# Patient Record
Sex: Female | Born: 1965 | Race: White | Hispanic: No | Marital: Married | Smoking: Never smoker
Health system: Southern US, Community
[De-identification: ages and names within clinical notes are randomized; demographics above are authoritative.]

## PROBLEM LIST (undated history)

## (undated) DIAGNOSIS — J309 Allergic rhinitis, unspecified: Secondary | ICD-10-CM

## (undated) DIAGNOSIS — R030 Elevated blood-pressure reading, without diagnosis of hypertension: Secondary | ICD-10-CM

## (undated) DIAGNOSIS — D759 Disease of blood and blood-forming organs, unspecified: Secondary | ICD-10-CM

## (undated) DIAGNOSIS — L708 Other acne: Secondary | ICD-10-CM

## (undated) DIAGNOSIS — K219 Gastro-esophageal reflux disease without esophagitis: Secondary | ICD-10-CM

## (undated) DIAGNOSIS — R112 Nausea with vomiting, unspecified: Secondary | ICD-10-CM

## (undated) DIAGNOSIS — F3289 Other specified depressive episodes: Secondary | ICD-10-CM

## (undated) DIAGNOSIS — F411 Generalized anxiety disorder: Secondary | ICD-10-CM

## (undated) DIAGNOSIS — T4145XA Adverse effect of unspecified anesthetic, initial encounter: Secondary | ICD-10-CM

## (undated) DIAGNOSIS — D235 Other benign neoplasm of skin of trunk: Secondary | ICD-10-CM

## (undated) DIAGNOSIS — G4700 Insomnia, unspecified: Secondary | ICD-10-CM

## (undated) DIAGNOSIS — Z9889 Other specified postprocedural states: Secondary | ICD-10-CM

## (undated) DIAGNOSIS — E782 Mixed hyperlipidemia: Secondary | ICD-10-CM

## (undated) DIAGNOSIS — T8859XA Other complications of anesthesia, initial encounter: Secondary | ICD-10-CM

## (undated) DIAGNOSIS — F329 Major depressive disorder, single episode, unspecified: Secondary | ICD-10-CM

## (undated) HISTORY — DX: Elevated blood-pressure reading, without diagnosis of hypertension: R03.0

## (undated) HISTORY — DX: Allergic rhinitis, unspecified: J30.9

## (undated) HISTORY — DX: Disease of blood and blood-forming organs, unspecified: D75.9

## (undated) HISTORY — DX: Generalized anxiety disorder: F41.1

## (undated) HISTORY — DX: Insomnia, unspecified: G47.00

## (undated) HISTORY — DX: Other benign neoplasm of skin of trunk: D23.5

## (undated) HISTORY — DX: Other specified depressive episodes: F32.89

## (undated) HISTORY — DX: Other acne: L70.8

## (undated) HISTORY — DX: Major depressive disorder, single episode, unspecified: F32.9

## (undated) HISTORY — DX: Mixed hyperlipidemia: E78.2

---

## 1995-08-10 HISTORY — PX: OTHER SURGICAL HISTORY: SHX169

## 1998-08-09 HISTORY — PX: BREAST SURGERY: SHX581

## 2004-04-23 ENCOUNTER — Other Ambulatory Visit: Admission: RE | Admit: 2004-04-23 | Discharge: 2004-04-23 | Payer: Self-pay | Admitting: Obstetrics and Gynecology

## 2004-09-07 ENCOUNTER — Inpatient Hospital Stay (HOSPITAL_COMMUNITY): Admission: AD | Admit: 2004-09-07 | Discharge: 2004-09-09 | Payer: Self-pay | Admitting: Obstetrics and Gynecology

## 2004-09-10 ENCOUNTER — Encounter: Admission: RE | Admit: 2004-09-10 | Discharge: 2004-10-10 | Payer: Self-pay | Admitting: Obstetrics and Gynecology

## 2005-05-14 ENCOUNTER — Ambulatory Visit: Payer: Self-pay | Admitting: Family Medicine

## 2005-06-03 ENCOUNTER — Ambulatory Visit: Payer: Self-pay | Admitting: Family Medicine

## 2005-12-08 ENCOUNTER — Ambulatory Visit: Payer: Self-pay | Admitting: Family Medicine

## 2006-09-12 ENCOUNTER — Inpatient Hospital Stay (HOSPITAL_COMMUNITY): Admission: AD | Admit: 2006-09-12 | Discharge: 2006-09-14 | Payer: Self-pay | Admitting: Obstetrics and Gynecology

## 2006-09-15 ENCOUNTER — Encounter: Admission: RE | Admit: 2006-09-15 | Discharge: 2006-10-13 | Payer: Self-pay | Admitting: Obstetrics and Gynecology

## 2006-10-14 ENCOUNTER — Encounter: Admission: RE | Admit: 2006-10-14 | Discharge: 2006-10-27 | Payer: Self-pay | Admitting: Obstetrics and Gynecology

## 2007-07-31 ENCOUNTER — Encounter: Payer: Self-pay | Admitting: Internal Medicine

## 2007-08-09 ENCOUNTER — Encounter: Payer: Self-pay | Admitting: Family Medicine

## 2007-10-04 ENCOUNTER — Ambulatory Visit: Payer: Self-pay | Admitting: Internal Medicine

## 2007-10-04 DIAGNOSIS — F411 Generalized anxiety disorder: Secondary | ICD-10-CM

## 2007-12-05 ENCOUNTER — Ambulatory Visit: Payer: Self-pay | Admitting: Endocrinology

## 2007-12-05 DIAGNOSIS — J069 Acute upper respiratory infection, unspecified: Secondary | ICD-10-CM | POA: Insufficient documentation

## 2008-04-22 ENCOUNTER — Ambulatory Visit: Payer: Self-pay | Admitting: Internal Medicine

## 2008-04-22 DIAGNOSIS — E782 Mixed hyperlipidemia: Secondary | ICD-10-CM | POA: Insufficient documentation

## 2008-04-22 DIAGNOSIS — D235 Other benign neoplasm of skin of trunk: Secondary | ICD-10-CM

## 2008-04-22 DIAGNOSIS — R030 Elevated blood-pressure reading, without diagnosis of hypertension: Secondary | ICD-10-CM | POA: Insufficient documentation

## 2008-04-22 DIAGNOSIS — G4700 Insomnia, unspecified: Secondary | ICD-10-CM

## 2008-04-22 DIAGNOSIS — E785 Hyperlipidemia, unspecified: Secondary | ICD-10-CM | POA: Insufficient documentation

## 2008-04-22 LAB — CONVERTED CEMR LAB
AST: 16 units/L (ref 0–37)
Alkaline Phosphatase: 91 units/L (ref 39–117)
BUN: 8 mg/dL (ref 6–23)
Bacteria, UA: NEGATIVE
Basophils Relative: 0.6 % (ref 0.0–3.0)
Calcium: 9.3 mg/dL (ref 8.4–10.5)
Cholesterol, target level: 200 mg/dL
Cholesterol: 228 mg/dL (ref 0–200)
Crystals: NEGATIVE
Direct LDL: 139 mg/dL
GFR calc Af Amer: 143 mL/min
Hemoglobin, Urine: NEGATIVE
Lymphocytes Relative: 21.3 % (ref 12.0–46.0)
MCHC: 35.9 g/dL (ref 30.0–36.0)
MCV: 82.8 fL (ref 78.0–100.0)
Monocytes Relative: 7.6 % (ref 3.0–12.0)
Mucus, UA: NEGATIVE
Neutrophils Relative %: 66.6 % (ref 43.0–77.0)
Nitrite: NEGATIVE
Platelets: 464 10*3/uL — ABNORMAL HIGH (ref 150–400)
Potassium: 4.4 meq/L (ref 3.5–5.1)
RBC / HPF: NONE SEEN
RDW: 11.2 % — ABNORMAL LOW (ref 11.5–14.6)
Total CHOL/HDL Ratio: 3.2
Total Protein: 7.4 g/dL (ref 6.0–8.3)
Urobilinogen, UA: 0.2 (ref 0.0–1.0)

## 2008-04-23 ENCOUNTER — Encounter: Payer: Self-pay | Admitting: Internal Medicine

## 2008-05-07 ENCOUNTER — Ambulatory Visit: Payer: Self-pay | Admitting: Internal Medicine

## 2008-05-07 DIAGNOSIS — D473 Essential (hemorrhagic) thrombocythemia: Secondary | ICD-10-CM

## 2008-05-07 DIAGNOSIS — R82998 Other abnormal findings in urine: Secondary | ICD-10-CM

## 2008-05-07 LAB — CONVERTED CEMR LAB
Basophils Absolute: 0 10*3/uL (ref 0.0–0.1)
Bilirubin Urine: NEGATIVE
Chlamydia, Swab/Urine, PCR: NEGATIVE
Eosinophils Relative: 3.2 % (ref 0.0–5.0)
HCT: 38.3 % (ref 36.0–46.0)
Lymphocytes Relative: 21 % (ref 12.0–46.0)
Monocytes Relative: 6.1 % (ref 3.0–12.0)
Mucus, UA: NEGATIVE
Neutro Abs: 5.6 10*3/uL (ref 1.4–7.7)
Neutrophils Relative %: 69.2 % (ref 43.0–77.0)
RBC / HPF: NONE SEEN
RDW: 11.7 % (ref 11.5–14.6)
Urine Glucose: NEGATIVE mg/dL
WBC: 8.1 10*3/uL (ref 4.5–10.5)

## 2008-09-17 ENCOUNTER — Telehealth: Payer: Self-pay | Admitting: Internal Medicine

## 2008-12-06 ENCOUNTER — Ambulatory Visit: Payer: Self-pay | Admitting: Endocrinology

## 2008-12-06 DIAGNOSIS — J309 Allergic rhinitis, unspecified: Secondary | ICD-10-CM | POA: Insufficient documentation

## 2009-04-29 ENCOUNTER — Telehealth: Payer: Self-pay | Admitting: Internal Medicine

## 2009-05-27 ENCOUNTER — Ambulatory Visit: Payer: Self-pay | Admitting: Internal Medicine

## 2009-05-27 DIAGNOSIS — K5289 Other specified noninfective gastroenteritis and colitis: Secondary | ICD-10-CM

## 2009-05-29 ENCOUNTER — Telehealth: Payer: Self-pay | Admitting: Internal Medicine

## 2009-07-08 ENCOUNTER — Ambulatory Visit: Payer: Self-pay | Admitting: Internal Medicine

## 2009-11-10 ENCOUNTER — Encounter: Admission: RE | Admit: 2009-11-10 | Discharge: 2009-11-10 | Payer: Self-pay | Admitting: Obstetrics and Gynecology

## 2010-04-12 ENCOUNTER — Emergency Department (HOSPITAL_COMMUNITY): Admission: EM | Admit: 2010-04-12 | Discharge: 2010-04-12 | Payer: Self-pay | Admitting: Family Medicine

## 2010-04-24 ENCOUNTER — Ambulatory Visit: Payer: Self-pay | Admitting: Internal Medicine

## 2010-04-24 DIAGNOSIS — F329 Major depressive disorder, single episode, unspecified: Secondary | ICD-10-CM

## 2010-05-01 ENCOUNTER — Ambulatory Visit: Payer: Self-pay | Admitting: Licensed Clinical Social Worker

## 2010-05-05 ENCOUNTER — Telehealth: Payer: Self-pay | Admitting: Internal Medicine

## 2010-05-08 ENCOUNTER — Ambulatory Visit: Payer: Self-pay | Admitting: Licensed Clinical Social Worker

## 2010-05-22 ENCOUNTER — Ambulatory Visit: Payer: Self-pay | Admitting: Licensed Clinical Social Worker

## 2010-06-16 ENCOUNTER — Encounter: Payer: Self-pay | Admitting: Internal Medicine

## 2010-06-16 ENCOUNTER — Ambulatory Visit: Payer: Self-pay | Admitting: Internal Medicine

## 2010-06-16 DIAGNOSIS — L708 Other acne: Secondary | ICD-10-CM | POA: Insufficient documentation

## 2010-06-16 LAB — CONVERTED CEMR LAB
AST: 17 units/L (ref 0–37)
Alkaline Phosphatase: 84 units/L (ref 39–117)
Basophils Absolute: 0 10*3/uL (ref 0.0–0.1)
Basophils Relative: 0.5 % (ref 0.0–3.0)
Bilirubin, Direct: 0.1 mg/dL (ref 0.0–0.3)
Calcium: 9.6 mg/dL (ref 8.4–10.5)
Chloride: 103 meq/L (ref 96–112)
Cholesterol: 266 mg/dL — ABNORMAL HIGH (ref 0–200)
Creatinine, Ser: 0.6 mg/dL (ref 0.4–1.2)
Direct LDL: 151 mg/dL
GFR calc non Af Amer: 116.45 mL/min (ref >60)
HCT: 36.3 % (ref 36.0–46.0)
HDL: 75.8 mg/dL (ref >39.00)
Hemoglobin: 12.8 g/dL (ref 12.0–15.0)
Ketones, ur: NEGATIVE mg/dL
Leukocytes, UA: NEGATIVE
Lymphs Abs: 1.7 10*3/uL (ref 0.7–4.0)
Monocytes Absolute: 0.6 10*3/uL (ref 0.1–1.0)
Nitrite: NEGATIVE
Platelets: 402 10*3/uL — ABNORMAL HIGH (ref 150.0–400.0)
RBC: 4.31 M/uL (ref 3.87–5.11)
RDW: 12.2 % (ref 11.5–14.6)
Sodium: 138 meq/L (ref 135–145)
TSH: 2.39 microintl units/mL (ref 0.35–5.50)
Total Bilirubin: 0.2 mg/dL — ABNORMAL LOW (ref 0.3–1.2)
Total Protein: 6.7 g/dL (ref 6.0–8.3)
Urine Glucose: NEGATIVE mg/dL
Urobilinogen, UA: 0.2 (ref 0.0–1.0)
VLDL: 42.4 mg/dL — ABNORMAL HIGH (ref 0.0–40.0)
WBC: 7.1 10*3/uL (ref 4.5–10.5)

## 2010-06-19 ENCOUNTER — Ambulatory Visit: Payer: Self-pay | Admitting: Licensed Clinical Social Worker

## 2010-06-22 ENCOUNTER — Telehealth (INDEPENDENT_AMBULATORY_CARE_PROVIDER_SITE_OTHER): Payer: Self-pay | Admitting: *Deleted

## 2010-07-30 ENCOUNTER — Encounter: Payer: Self-pay | Admitting: Internal Medicine

## 2010-08-30 ENCOUNTER — Encounter: Payer: Self-pay | Admitting: *Deleted

## 2010-09-08 NOTE — Assessment & Plan Note (Signed)
Summary: TRANSFER FROM DR JONES/ INJURED FINGER/ REFERRAL WANTED/ ANXI...   Vital Signs:  Patient profile:   45 year old female Height:      63 inches (160.02 cm) Weight:      179.8 pounds (81.73 kg) BMI:     31.97 O2 Sat:      95 % on Room air Temp:     98.7 degrees F (37.06 degrees C) oral Pulse rate:   101 / minute BP sitting:   138 / 82  (right arm) Cuff size:   large  Vitals Entered By: Orlan Leavens RMA (April 24, 2010 11:27 AM)  O2 Flow:  Room air CC: transferring from dr. Yetta Barre injured (L) hand ? referral Is Patient Diabetic? No Comments want rew rx for her cream need to go to [facility]   Primary Care Provider:  Newt Lukes MD  CC:  transferring from dr. Yetta Barre injured (L) hand ? referral.  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, Surveyor, quantity) planning to resume counseling - therpay appt sched in 2 weeks no SI/HI  also ?finger injury - cut tip of left index finger last week with kitchen knife seen in ED for same -  no swelling, drainage or fever +pain at site, unable to keep covered b/c location on finger  Preventive Screening-Counseling & Management  Alcohol-Tobacco     Smoking Status: never     Passive Smoke Exposure: no  Clinical Review Panels:  Lipid Management   Cholesterol:  228 (04/22/2008)   LDL (bad choesterol):  DEL (04/22/2008)   HDL (good cholesterol):  71.3 (04/22/2008)  CBC   WBC:  8.1 (05/07/2008)   RBC:  4.61 (05/07/2008)   Hgb:  13.4 (05/07/2008)   Hct:  38.3 (05/07/2008)   Platelets:  413 (05/07/2008)   MCV  83.2 (05/07/2008)   MCHC  35.0 (05/07/2008)   RDW  11.7 (05/07/2008)   PMN:  69.2 (05/07/2008)   Lymphs:  21.0  (05/07/2008)   Monos:  6.1 (05/07/2008)   Eosinophils:  3.2 (05/07/2008)   Basophil:  0.5 (05/07/2008)  Complete Metabolic Panel   Glucose:  94 (04/22/2008)   Sodium:  139 (04/22/2008)   Potassium:  4.4 (04/22/2008)   Chloride:  106 (04/22/2008)   CO2:  30 (04/22/2008)   BUN:  8 (04/22/2008)   Creatinine:  0.6 (04/22/2008)   Albumin:  3.6 (04/22/2008)   Total Protein:  7.4 (04/22/2008)   Calcium:  9.3 (04/22/2008)   Total Bili:  0.5 (04/22/2008)   Alk Phos:  91 (04/22/2008)   SGPT (ALT):  21 (04/22/2008)   SGOT (AST):  16 (04/22/2008)   Current Medications (verified): 1)  Aug Betamethasone Dipropionate 0.05 %  Crea (Aug Betamethasone Dipropionate) .... Apply To Affected Area Twice A Day For 2 Weeks 2)  Yaz 3-0.02 Mg  Tabs (Drospirenone-Ethinyl Estradiol) .... Take 1 Tablet By Mouth Once A Day Prn 3)  Zyrtec Allergy 10 Mg  Tabs (Cetirizine Hcl) .... Take As Needed 4)  Advil 200 Mg  Tabs (Ibuprofen) .... Take 1-2 By Mouth Once Daily Prn 5)  Melatonin 3 Mg Caps (Melatonin) .... Take At Bedtime 6)  Ranitidine Hcl 150 Mg Tabs (Ranitidine Hcl) .Marland KitchenMarland KitchenMarland Kitchen  Take 1 At Bedtime 7)  Sudafed 30 Mg Tabs (Pseudoephedrine Hcl) .... 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  The patient denies fever, weight loss, chest pain, syncope, peripheral edema, and abdominal pain.    Physical Exam  General:  alert and overweight-appearing.   Lungs:  normal respiratory effort, no intercostal  retractions or use of accessory muscles; normal breath sounds bilaterally - no crackles and no wheezes.    Heart:  normal rate, regular rhythm, no murmur, and no rub. BLE without edema.  Psych:  tearful at times during exam - Oriented X3, memory intact for recent and remote, normally interactive, good eye contact, mild anxious appearing, mod depressed appearing, and not agitated.      Impression & Recommendations:  Problem # 1:  DEPRESSION (ICD-311)  hx same, progressive symptoms - supported plans for re-initiated counseling - appt already set w/ susan bond but not available for 2 weeks also start meds - prev on sertraline 20y ago - will try wellbutrin to minimize sexual dysfx side effects - poss adv rxn as well as potential risk/benefit reviewed - pt agrees and will call if symptoms worse also use as needed xanax f/u 4-6 weeks to review symptoms, titrate or change meds as needed   Time spent with patient 30 minutes, more than 50% of this time was spent counseling patient on depression and problems concerning the need for med trials and titration for symptom control Her updated medication list for this problem includes:    Bupropion Hcl 150 Mg Xr24h-tab (Bupropion hcl) .Marland Kitchen... 1 by mouth once daily    Alprazolam 0.5 Mg Tabs (Alprazolam) .Marland Kitchen... 1/2-1 by mouth every 8 hours as needed  Orders: Prescription Created Electronically [phone])  Complete Medication List: 1)  Aug Betamethasone Dipropionate 0.05 % Crea (Aug betamethasone dipropionate) .... Apply to affected area twice a day for 2 weeks 2)  Yaz 3-0.02 Mg Tabs (Drospirenone-ethinyl estradiol) .... Take 1 tablet by mouth once a day prn 3)  Zyrtec Allergy 10 Mg Tabs (Cetirizine hcl) .... Take as needed 4)  Advil 200 Mg Tabs (Ibuprofen) .... Take 1-2 by mouth once daily prn 5)  Melatonin 3 Mg Caps (Melatonin) .... Take at bedtime 6)  Ranitidine Hcl 150 Mg Tabs (Ranitidine hcl) .... Take 1 at bedtime 7)  Sudafed 30 Mg Tabs (Pseudoephedrine  hcl) .... Use at bedtime 8)  Bupropion Hcl 150 Mg Xr24h-tab (Bupropion hcl) .Marland Kitchen.. 1 by mouth once daily 9)  Alprazolam 0.5 Mg Tabs (Alprazolam) .... 1/2-1 by mouth every 8 hours as needed  Patient Instructions: 1)  it was good to see you today. 2)  medications as discussed - your prescriptions have been electronically submitted to your pharmacy. Please take as directed. Contact our office if you believe you're having problems with the medication(s).  3)  Please schedule a follow-up appointment in 4-6 weeks - will perform med physical and labs. also review meds and symtoms , call sooner if problems.  Prescriptions: ALPRAZOLAM 0.5 MG TABS (ALPRAZOLAM) 1/2-1 by  mouth every 8 hours as needed  #40 x 1   Entered and Authorized by:   Newt Lukes MD   Signed by:   Newt Lukes MD on 04/24/2010   Method used:   Printed then faxed [facility] ...       [facility] [address] [phone]* (retail)       [address]       [place], [place]  [address]       Ph: [phone] or [phone]       Fax: [phone]   RxID:   [id number] BUPROPION HCL 150 MG XR24H-TAB (BUPROPION HCL) 1 by mouth once daily  #30 x 2   Entered and Authorized by:   Newt Lukes MD   Signed by:   Newt Lukes MD on 04/24/2010   Method used:   [facility]ron[facility] to        [facility] [address] [phone]* (retail)       [address]       [place], [place]  [address]       Ph: [phone] or [phone]       Fax: [phone]   RxID:   [id number]

## 2010-09-08 NOTE — Progress Notes (Signed)
Summary: rx refill req  Phone Note Refill Request Message from:  Patient on May 05, 2010 11:52 AM  Refills Requested: Medication #1:  AUG BETAMETHASONE DIPROPIONATE 0.05 %  CREA apply to affected area twice a day for 2 weeks   Dosage confirmed as above?Dosage Confirmed   Notes: [facility] ok for refill? please advise  Initial call taken by: Brenton Grills MA,  May 05, 2010 11:52 AM  Follow-up for Phone Call        ok to fill as prev rx'd Follow-up by: Newt Lukes MD,  May 05, 2010 12:18 PM  Additional Follow-up for Phone Call Additional follow up Details #1::        pt informed rx sent to [facility] Additional Follow-up by: Brenton Grills MA,  May 05, 2010 1:34 PM    Prescriptions: AUG BETAMETHASONE DIPROPIONATE 0.05 %  CREA (AUG BETAMETHASONE DIPROPIONATE) apply to affected area twice a day for 2 weeks  #45 x 3   Entered by:   Brenton Grills MA   Authorized by:   Newt Lukes MD   Signed by:   Brenton Grills MA on 05/05/2010   Method used:   Electronically to        [facility]* (retail)             ,          Ph: [phone]       Fax: [phone]   RxID:   [id number]

## 2010-09-08 NOTE — Assessment & Plan Note (Signed)
Summary: CPX/ NWS  #   Vital Signs:  Patient profile:   45 year old female Height:      63 inches (160.02 cm) Weight:      180.0 pounds (81.82 kg) O2 Sat:      97 % on Room air Temp:     98.9 degrees F (37.17 degrees C) oral Pulse rate:   101 / minute BP sitting:   112 / 78  (left arm) Cuff size:   large  Vitals Entered By: Orlan Leavens RMA (June 16, 2010 9:50 AM)  O2 Flow:  Room air CC: CPX Is Patient Diabetic? No Pain Assessment Patient in pain? no      Comments Want to discuss bupropion & alprazolam   Primary Care Markavious Micco:  Newt Lukes MD  CC:  CPX.  History of Present Illness: patient is here today for annual physical. Patient feels well and has no complaints.  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, Surveyor, quantity) planning to resume counseling - therpay appt sched in 2 weeks no SI/HI started bupropion for same 04/2010 and alpraz as needed -  feels improved, symptoms tolerable at this time though stressors unchanged  concerned with inc in acne - initially improved on Yaz long hx same- prev on retinA ?need cream or wash  dyslipidemia - has never taken rx same for same but started natural med for same in past week - would like to use this rather than rx if able -   Preventive Screening-Counseling & Management  Alcohol-Tobacco     Alcohol drinks/day: 0     Alcohol type: (rare)     Alcohol Counseling: not indicated; patient does not drink     Smoking Status: never     Passive Smoke Exposure: no     Tobacco Counseling: not indicated; no tobacco use  Caffeine-Diet-Exercise     Does Patient Exercise: yes     Exercise Counseling: not indicated; exercise is adequate     Depression Counseling: not indicated; screening negative for depression  Safety-Violence-Falls     Seat Belt Counseling: not indicated; patient wears seat belts     Helmet Counseling: not applicable     Firearm Counseling: not applicable     Violence Counseling: not applicable     Fall Risk Counseling: not indicated; no significant falls noted  Clinical Review Panels:  Prevention   Last Mammogram:  Pt states was done @ [facility] results was normal (11/07/2009)   Last Pap Smear:  Interpretation /Result:Negative for intraepithelial Lesion or Malignancy.    (11/07/2009)  Immunizations   Last Tetanus Booster:  Tdap (05/27/2009)   Last Flu Vaccine:  Fluvax 3+ (06/16/2010)  Lipid Management   Cholesterol:  228 (04/22/2008)   LDL (bad choesterol):  DEL (04/22/2008)   HDL (good cholesterol):  71.3 (04/22/2008)  CBC   WBC:  8.1 (05/07/2008)   RBC:  4.61 (05/07/2008)   Hgb:  13.4 (05/07/2008)   Hct:  38.3 (05/07/2008)   Platelets:  413 (05/07/2008)   MCV  83.2 (05/07/2008)   MCHC  35.0 (05/07/2008)   RDW  11.7 (05/07/2008)   PMN:  69.2 (05/07/2008)   Lymphs:  21.0 (05/07/2008)   Monos:  6.1 (05/07/2008)   Eosinophils:  3.2 (05/07/2008)   Basophil:  0.5 (05/07/2008)  Complete Metabolic Panel   Glucose:  94 (04/22/2008)   Sodium:  139 (04/22/2008)   Potassium:  4.4 (04/22/2008)   Chloride:  106 (04/22/2008)   CO2:  30 (04/22/2008)   BUN:  8 (04/22/2008)   Creatinine:  0.6 (04/22/2008)   Albumin:  3.6 (04/22/2008)   Total Protein:  7.4 (04/22/2008)   Calcium:  9.3 (04/22/2008)   Total Bili:  0.5 (04/22/2008)   Alk Phos:  91 (04/22/2008)   SGPT (ALT):  21 (04/22/2008)   SGOT (AST):  16 (04/22/2008)   Current Medications (verified): 1)  Aug Betamethasone Dipropionate 0.05 %  Crea (Aug Betamethasone Dipropionate) .... Apply To Affected Area Twice A Day For 2 Weeks 2)  Yaz 3-0.02 Mg  Tabs (Drospirenone-Ethinyl Estradiol) .... Take 1 Tablet By Mouth Once A Day Prn 3)   Zyrtec Allergy 10 Mg  Tabs (Cetirizine Hcl) .... Take As Needed 4)  Advil 200 Mg  Tabs (Ibuprofen) .... Take 1-2 By Mouth Once Daily Prn 5)  Melatonin 3 Mg Caps (Melatonin) .... Take At Bedtime 6)  Ranitidine Hcl 150 Mg Tabs (Ranitidine Hcl) .... Take 1 At Bedtime 7)  Sudafed 30 Mg Tabs (Pseudoephedrine Hcl) .... Use At Bedtime 8)  Bupropion Hcl 150 Mg Xr24h-Tab (Bupropion Hcl) .Marland Kitchen.. 1 By Mouth Once Daily 9)  Alprazolam 0.5 Mg Tabs (Alprazolam) .... 1/2-1 By Mouth Every 8 Hours As Needed  Allergies (verified): No Known Drug Allergies  Past History:  Past medical, surgical, family and social histories (including risk factors) reviewed, and no changes noted (except as noted below).  Past Medical History: Hx Anxiety and Depression age 65s GERD during pregnancy History of mononucleosis with assoc hepatitis eczema acne  MD roster: gyn - jodi brovard  Past Surgical History: Reviewed history from 10/04/2007 and no changes required. Breast Reduction - 2000 Deviated septum repair - 1997  Family History: Reviewed history from 10/04/2007 and no changes required. Mother is age 91 - healthy Father is age 28 - hyperlipidemia and hypertension Paternal grandfather has history of CAD Younger sister has history of post partum depression.  Social History: Reviewed history from 04/24/2010 and no changes required. 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 I have reviewed all other systems and they were negative.   Physical Exam  General:  overweight-appearing.  alert, well-developed, well-nourished, and cooperative to examination.    Head:  Normocephalic and atraumatic without obvious abnormalities. No apparent alopecia or balding. Eyes:  vision grossly intact; pupils equal,  round and reactive to light.  conjunctiva and lids normal.    Ears:  normal pinnae bilaterally, without erythema, swelling, or tenderness to palpation. (small acne within left canal tender with otoscope); TMs clear, without effusion, or cerumen impaction. Hearing grossly normal bilaterally  Mouth:  teeth and gums in good repair; mucous membranes moist, without lesions or ulcers. oropharynx clear without exudate, no erythema.  Neck:  supple, full ROM, no masses, no thyromegaly; no thyroid nodules or tenderness. no JVD or carotid bruits.   Lungs:  normal respiratory effort, no intercostal retractions or use of accessory muscles; normal breath sounds bilaterally - no crackles and no wheezes.    Heart:  normal rate, regular rhythm, no murmur, and no rub. BLE without edema. Abdomen:  soft, non-tender, normal bowel sounds, no distention; no masses and no appreciable hepatomegaly or splenomegaly.   Genitalia:  defer to gyn Msk:  No deformity or scoliosis noted of thoracic or lumbar spine.   Neurologic:  alert & oriented X3 and cranial nerves II-XII symetrically intact.  strength normal in all extremities, sensation intact to light touch, and gait normal. speech fluent without dysarthria or aphasia; follows commands with good comprehension.  Skin:  cystic acne, also cholestrol deposists around B eyes; no rashes, vesicles, ulcers, or erythema. No nodules or irregularity to palpation.  Cervical Nodes:  No lymphadenopathy noted Axillary Nodes:  No palpable lymphadenopathy - tender area on left side (chronic) Psych:  Oriented X3, memory intact for recent and remote, normally interactive, good eye contact, not anxious appearing, not depressed appearing, and not agitated.      Impression & Recommendations:  Problem # 1:  PREVENTIVE HEALTH CARE (ICD-V70.0) Patient has been counseled on age-appropriate routine health concerns for screening and prevention. These are reviewed and up-to-date. Immunizations are  up-to-date or declined. Labs ordered (done this AM) and ECG reviewed.  Orders: EKG w/ Interpretation (93000)  Problem # 2:  DEPRESSION (ICD-311)  Her updated medication list for this problem includes:    Bupropion Hcl 150 Mg Xr24h-tab (Bupropion hcl) .Marland Kitchen... 1 by mouth once daily    Alprazolam 0.5 Mg Tabs (Alprazolam) .Marland Kitchen... 1/2-1 by mouth every 8 hours as needed  hx same, progressive symptoms - supported plans for re-initiated counseling - appt already set w/ susan bond but not available for 2 weeks 04/2010 resumed meds -wellbutrin to minimize sexual dysfx side effects and as needed xanax rx [facility] cont same w/o change - f/u 3 months  Problem # 3:  CYSTIC ACNE (ICD-706.1)  on Yaz - suggested f/u gyn re: poss change, esp with inc DVT risk - pt declines rx for wash and cream - pt will try and let us know what works - or if alt med tx needed Her updated medication list for this problem includes:    Benzaclin 1-5 % Gel (Clindamycin phos-benzoyl perox) .Marland Kitchen... Wash face at bedtime as needed for acne    Tretinoin 0.05 % Crea (Tretinoin) .Marland Kitchen... Apply to affected skin once daily - two times a day as needed  Discussed care of the skin and different treatment options.   Orders: Prescription Created Electronically [phone])  Problem # 4:  INSOMNIA (ICD-780.52) chronic symptoms - intol of ambien in past (per her report) continue melatonin as needed. Treat the anxiety as ongoing and with lifestyle modifications and psychotherapy. rec improved sleep hygiene.   Problem # 5:  MIXED HYPERLIPIDEMIA (ICD-272.2)  prev rx'd crestor - did not take - on "plant" natural chol med x 1 week - to cont same until NEXT lipids if inc FLP this AM also continue lifestyle modifications including diet, exercise and weight loss.  Labs Reviewed: SGOT: 16 (04/22/2008)   SGPT: 21 (04/22/2008)  Lipid Goals: Chol Goal: 200 (04/22/2008)   HDL Goal: 40 (04/22/2008)   LDL Goal: 160 (04/22/2008)   TG Goal: 150  (04/22/2008)  Prior 10 Yr Risk Heart Disease: Not enough information (04/22/2008)   HDL:71.3 (04/22/2008)  LDL:DEL (04/22/2008)  Chol:228 (04/22/2008)  Trig:167 (04/22/2008)  Complete Medication List: 1)  Aug Betamethasone Dipropionate 0.05 % Crea (Aug betamethasone dipropionate) .... Apply to affected area twice a day for 2 weeks 2)  Yaz 3-0.02 Mg Tabs (Drospirenone-ethinyl estradiol) .... Take 1 tablet by mouth once a day prn 3)  Zyrtec  Allergy 10 Mg Tabs (Cetirizine hcl) .... Take as needed 4)  Advil 200 Mg Tabs (Ibuprofen) .... Take 1-2 by mouth once daily prn 5)  Melatonin 3 Mg Caps (Melatonin) .... Take at bedtime 6)  Ranitidine Hcl 150 Mg Tabs (Ranitidine hcl) .... Take 1 at bedtime 7)  Sudafed 30 Mg Tabs (Pseudoephedrine hcl) .... Use at bedtime 8)  Bupropion Hcl 150 Mg Xr24h-tab (Bupropion hcl) .Marland Kitchen.. 1 by mouth once daily 9)  Alprazolam 0.5 Mg Tabs (Alprazolam) .... 1/2-1 by mouth every 8 hours as needed 10)  Benzaclin 1-5 % Gel (Clindamycin phos-benzoyl perox) .... Wash face at bedtime as needed for acne 11)  Tretinoin 0.05 % Crea (Tretinoin) .... Apply to affected skin once daily - two times a day as needed  Other Orders: Admin 1st Vaccine ([address]) Flu Vaccine 57yrs + (98119)  Patient Instructions: 1)  it was good to see you today. 2)  will review your labs once returned and mail copy to you 3)  medications for acne as discussed - local prescriptions your prescriptions have been electronically submitted to your pharmacy. Please take as directed. Contact our office if you believe you're having problems with the medication(s). [facility] for either of these 4)  continue the wellbutrin and xanax as ongoing - refills sent to [facility] 5)  Please schedule a follow-up appointment in 3-4 months to review depression and anxiety, call sooner if problems.  Prescriptions: BUPROPION HCL 150 MG XR24H-TAB (BUPROPION HCL) 1 by mouth once daily  #90 x 3   Entered by:   Orlan Leavens RMA   Authorized by:   Newt Lukes MD   Signed by:   Orlan Leavens RMA on 06/16/2010   Method used:   Faxed to ...       [facility] (mail-order)             , [place]         Ph: [phone]       Fax: [phone]   RxID:   [id number] ALPRAZOLAM 0.5 MG TABS (ALPRAZOLAM) 1/2-1 by mouth every 8 hours as needed  #120 x 1   Entered and Authorized by:   Newt Lukes MD   Signed by:   Newt Lukes MD on 06/16/2010   Method used:   Print then Give to Patient   RxID:   [id number] TRETINOIN 0.05 % CREA (TRETINOIN) apply to affected skin once daily - two times a day as needed  #1 x 1   Entered and Authorized by:   Newt Lukes MD   Signed by:   Newt Lukes MD on 06/16/2010   Method used:   [facility]        [facility] [address] [phone]* (retail)       [address]       [place], [place]  [address]       Ph: [phone] or [phone]       Fax: [phone]   RxID:   [id number] BENZACLIN 1-5 % GEL (CLINDAMYCIN PHOS-BENZOYL PEROX) wash face at bedtime as needed for acne  #1 x 0   Entered and Authorized by:   Newt Lukes MD   Signed by:   Newt Lukes MD on 06/16/2010   Method used:   [facility] to        [facility] [address] [phone]* (retail)       [address]       [address]es       [place], [place]  [address]       Ph: [phone] or [phone]       Fax: [phone]   RxID:   [id number]    Orders Added: 1)  Admin 1st Vaccine [90471] 2)  Flu Vaccine 65yrs + [64403] 3)  EKG w/ Interpretation [93000] 4)  Est. Patient 40-64 years [99396] 5)  Est. Patient Level IV [47425] 6)  Prescription Created Electronically [[id number]]     Mammogram  Procedure date:  11/07/2009  Findings:      Pt states was done @ [facility] results was normal  Pap Smear  Procedure date:  11/07/2009  Findings:      Interpretation /Result:Negative for intraepithelial Lesion or Malignancy.    Flu  Vaccine Consent Questions     Do you have a history of severe allergic reactions to this vaccine? no    Any prior history of allergic reactions to egg and/or gelatin? no    Do you have a sensitivity to the preservative Thimersol? no    Do you have a past history of Guillan-Barre Syndrome? no    Do you currently have an acute febrile illness? no    Have you ever had a severe reaction to latex? no    Vaccine information given and explained to patient? yes    Are you currently pregnant? no    Lot Number:[id number]   Exp Date:02/06/2011   Site Given  Left Deltoid IM  Pap Smear  Procedure date:  11/07/2009  Findings:      Interpretation /Result:Negative for intraepithelial Lesion or Malignancy.    Marland Kitchenlbflu

## 2010-09-10 NOTE — Progress Notes (Signed)
Summary: PA-Tretinoin  Phone Note From Pharmacy   Summary of Call: PA-Tretinoin, [facility] @ [phone], Ref # [id number], awaiting form. Jessica [name]  June 22, 2010 4:54 PM  Faxed PA to [facility] @ [phone], awaiting approval. Jessica [name]  June 24, 2010 3:22 PM  Approved 06/25/10-06/24/2012. Initial call taken by: Jessica [name],  July 30, 2010 1:06 PM

## 2010-09-10 NOTE — Medication Information (Signed)
Summary: Prior autho & certified in total for Tretinoin/[employer]  Prior autho & certified in total for Tretinoin/[employer]   Imported By: Sherian Rein 08/06/2010 09:28:14  _____________________________________________________________________  External Attachment:    Type:   Image     Comment:   External Document

## 2010-11-23 ENCOUNTER — Telehealth: Payer: Self-pay

## 2010-11-23 NOTE — Telephone Encounter (Signed)
Patient called [facility] needing to know id MD needs to see her back. She was seen back in oct but not sure about a follow up appt. Please advise Thanks

## 2010-11-23 NOTE — Telephone Encounter (Signed)
Called pt per last ov md stated need to f/u in 3-4 months. Transferred pt to schedulers to set up appt...11/23/10@4 :54pm/LMB

## 2010-11-25 ENCOUNTER — Encounter: Payer: Self-pay | Admitting: Internal Medicine

## 2010-11-30 ENCOUNTER — Encounter: Payer: Self-pay | Admitting: Internal Medicine

## 2010-11-30 ENCOUNTER — Ambulatory Visit (INDEPENDENT_AMBULATORY_CARE_PROVIDER_SITE_OTHER): Payer: BC Managed Care – PPO | Admitting: Internal Medicine

## 2010-11-30 DIAGNOSIS — F411 Generalized anxiety disorder: Secondary | ICD-10-CM

## 2010-11-30 DIAGNOSIS — E782 Mixed hyperlipidemia: Secondary | ICD-10-CM

## 2010-11-30 NOTE — Progress Notes (Signed)
  Subjective:    Patient ID: Jessica [name], female    DOB: 07-14-1966, 45 y.o.   MRN: [id number]  HPI Here for follow up - reviewed chronic med issues  depression - onset summer 2011 Then associated with tearfulness, nervousness and sleep problems (chronic poor sleep) - Also associated with irritability of mood +hx same - tx with therapy and meds during last episode age 63s (20y ago) current symptoms precipitated by +social stressors (loss of spouse's job, Surveyor, quantity) briefly resume counseling fall 2011 - not needed at this time (over the crisis)-  no SI/HI- started bupropion for same 04/2010 and alpraz as needed -  feels improved, symptoms tolerable at this time though stressors unchanged  cystic acne - initially improved on Yaz long hx same- prev on retinA Also uses cream or wash  dyslipidemia - has never taken rx same (prev rx crestor) for same but takes natural med  - would like to use this rather than rx if able -   Past Medical History  Diagnosis Date  . DYSPLASTIC NEVUS, CHEST   . THROMBOCYTOSIS   . CYSTIC ACNE   . INSOMNIA   . ELEVATED BP READING WITHOUT DX HYPERTENSION   . DEPRESSION   . ALLERGIC RHINITIS CAUSE UNSPECIFIED   . ANXIETY DISORDER   . Mixed hyperlipidemia      Review of Systems  Constitutional: Negative for unexpected weight change.  Respiratory: Negative for shortness of breath.   Cardiovascular: Negative for chest pain.  Neurological: Negative for headaches.       Objective:   Physical Exam  Constitutional: She appears well-developed and well-nourished.       overweight  HENT:  Head: Normocephalic and atraumatic.  Cardiovascular: Normal rate, regular rhythm and normal heart sounds.   Pulmonary/Chest: Effort normal and breath sounds normal. No respiratory distress.  Psychiatric: She has a normal mood and affect. Her behavior is normal. Thought content normal.   Lab Results  Component Value Date   WBC 7.1 06/16/2010   HGB 12.8 06/16/2010    HCT 36.3 06/16/2010   PLT 402.0* 06/16/2010   CHOL 266* 06/16/2010   TRIG 212.0* 06/16/2010   HDL 75.80 06/16/2010   LDLDIRECT 151.0 06/16/2010   ALT 18 06/16/2010   AST 17 06/16/2010   NA 138 06/16/2010   K 4.8 06/16/2010   CL 103 06/16/2010   CREATININE 0.6 06/16/2010   BUN 12 06/16/2010   CO2 29 06/16/2010   TSH 2.39 06/16/2010       Assessment & Plan:   See problem list. Medications and labs reviewed today.

## 2010-11-30 NOTE — Patient Instructions (Signed)
It was good to see you today. Medications reviewed, no changes at this time. Call when refills needed Continue to work on lifestyle changes as discussed (low fat, low carb diet diet; improved exercise efforts; weight loss) to control sugar, blood pressure and cholesterol levels and/or reduce risk of developing other medical problems. Please schedule followup in 6 months for physical and labs, call sooner if problems.

## 2010-11-30 NOTE — Assessment & Plan Note (Signed)
The current medical regimen is effective;  continue present plan and medications.  

## 2010-11-30 NOTE — Assessment & Plan Note (Signed)
Working on diet, exercise and omega 3 supplements as she declines statin tx -   continue present plan and medications.

## 2010-12-11 ENCOUNTER — Other Ambulatory Visit: Payer: Self-pay | Admitting: Obstetrics and Gynecology

## 2010-12-11 DIAGNOSIS — Z1231 Encounter for screening mammogram for malignant neoplasm of breast: Secondary | ICD-10-CM

## 2010-12-15 ENCOUNTER — Ambulatory Visit
Admission: RE | Admit: 2010-12-15 | Discharge: 2010-12-15 | Disposition: A | Discharge status: Home / Self Care | Payer: BC Managed Care – PPO | Source: Ambulatory Visit | Attending: Obstetrics and Gynecology | Admitting: Obstetrics and Gynecology

## 2010-12-15 DIAGNOSIS — Z1231 Encounter for screening mammogram for malignant neoplasm of breast: Secondary | ICD-10-CM

## 2010-12-25 NOTE — Discharge Summary (Signed)
Jessica [name], Jessica [name]           ACCOUNT NO.:  [id number]   MEDICAL RECORD NO.:  [id number]          PATIENT TYPE:  INP   LOCATION:  9128                          FACILITY:  [facility]   PHYSICIAN:  Sherron Monday, MD        DATE OF BIRTH:  07/30/1966   DATE OF ADMISSION:  09/12/2006  DATE OF DISCHARGE:  09/14/2006                               DISCHARGE SUMMARY   ADMISSION DIAGNOSIS:  Intrauterine pregnancy at term with spontaneous  rupture of membranes.   DISCHARGE DIAGNOSIS:  Intrauterine pregnancy at term with spontaneous  rupture of membranes, delivered by spontaneous vaginal delivery.   HISTORY OF PRESENT ILLNESS:  A 45 year old G2, P1-0-0-1 at 37+ weeks by  first trimester ultrasound consistent with last menstrual period with an  [facility] of September 29, 2006 presents with complaints of loss of clear fluid  at 1 a.m. Evaluation in the admission maternal unit confirmed rupture of  membranes. Her sterile vaginal exam was 2-cm dilated, 50% effaced and -2  station.   Her past medical history is significant for thyroid disorders,  depression and anxiety, low back pain. Past surgical history is  significant for breast reduction.   PAST OBSTETRICAL/GYNECOLOGICAL HISTORY:  G1 was a 36 weeks' delivery, 7  pound 1 ounce, no complications. G2 is the present pregnancy. GYN:  She  has secondary infertility. She conceived on Clomid.   She has no known drug allergies.   She takes no medications currently.   Denies alcohol, tobacco, or drug use in the pregnancy and is married.   PRENATAL LABORATORY DATA:  A positive, antibody screen negative. RPR  nonreactive. Rubella immune. Hepatitis B surface antigen negative.  Gonorrhea negative. Chlamydia negative. Glucola of 89. Group B strep was  negative per the patient.   PRENATAL CARE:  She has advanced maternal age. She had a normal amnio  and was treated for an upper respiratory infection at 28 weeks. She also  had some problems with GERD that was  treated with Zantac.   On admission, she was afebrile with stable vital signs and was admitted  after Pitocin was started, and her progress was monitored. She  progressed rapidly to complete/complete and plus 2, and delivered a  viable female infant at 12:45 with Apgars at 9 at one minute and 10 at  five minutes and a weight of 6 pounds 13 ounces. Placenta was delivered  intact at 12:49. First degree perineal laceration and left labial  laceration was repaired with 2-0 Vicryl in a normal fashion. EBL was  less than 500 mL. Postoperative course was relatively uncomplicated. She  remained afebrile with stable vital signs throughout. Had routine care.  Her fundus remained nontender. Her hemoglobin decreased from 11.2 to  9.1.   Her discharge information:  She was A positive, rubella immune. She  plans to breast and bottle feed. Her hemoglobin as mentioned decreased  from 11.2 to 9.1. She will start using oral contraceptive pills at her  postpartum  checkup for contraception. She was discharged to home with prescriptions  for Motrin, prenatal vitamins and Vicodin as well as instructions to  take Colace  and MiraLax as needed for constipation. She voiced  understanding of these instructions and was discharged to home.      Sherron Monday, MD  Electronically Signed     JB/MEDQ  D:  09/14/2006  T:  09/14/2006  Job:  [id number]

## 2010-12-25 NOTE — Discharge Summary (Signed)
Jessica [name], Jessica [name]           ACCOUNT NO.:  [id number]   MEDICAL RECORD NO.:  [id number]          PATIENT TYPE:  INP   LOCATION:  9121                           FACILITY:   PHYSICIAN:  Malachi Pro. Ambrose Mantle, M.D. DATE OF BIRTH:  August 07, 1966   DATE OF ADMISSION:  09/07/2004  DATE OF DISCHARGE:  09/09/2004                                 DISCHARGE SUMMARY   A 45 year old white, married female, para 0, gravida 1, EDC October 02, 2004 by ultrasound admitted with premature rupture of the membranes.  Blood  group and type A positive with a negative antibody, Tay-Sachs negative, RPR  nonreactive, rubella immune, hepatitis B surface antigen negative, HIV  negative, GC and Chlamydia negative.  TSH 1.48.  One hour Glucola 111.  Group B Streptococcus negative.  The patient had intrauterine insemination  on January 08, 2004.  EDC was predicted as October 02, 2004.  Ultrasound on  June 08, 2004 showed a continued presence of a left ovarian cyst, 6 x 3.3  x 3.8 cm.  The patient declined surgical intervention.  On August 24, 2004  the cyst was smaller, only 1.9 x 1.6 x 2.1 cm.  An echo-free estimated fetal  weight was 2886 grams.  The patient had leakage of fluid and came here to  the Maternity Admission Unit.  Rupture of membranes was confirmed by fern on  speculum exam.  The patient was placed on Pitocin.  Contractions gradually  became every 2-3 minutes.   PAST MEDICAL HISTORY:   ALLERGIES:  No known allergies.   She had a septoplasty in 1994, breast reduction in 1996.  She had anxiety  and depression in the past.  Her sister had postpartum depression.   FAMILY HISTORY:  Father with high blood pressure.  Paternal grandfather with  high blood pressure and an MI.   HABITS:  The patient does not drink, smoke or take drugs.   PHYSICAL EXAMINATION:  On admission, her vital signs were normal.  Heart and  lungs were normal.  Fundal height had been 35.5 on August 31, 2004.  Fetal  heart  tones were normal.  Cervix 1 cm, 80%, vertex at a 0 station per the  admitting nurse.   HOSPITAL COURSE:  The patient reached 3 cm and received an epidural at  approximately 12 noon.  The cervix was fully dilated.  She pushed well  between periods of nausea and vomiting and delivered spontaneously over a  second-degree midline laceration by Dr. Ambrose Mantle, a living female infant, 7  pounds 1 ounce with Apgar's of 8 at 1 and 9 at 5 minutes.  The placenta was  intact.  Uterus normal.  Rectal was negative.  Second-degree midline  laceration repaired with 3-0 Vicryl.  Perineal hematoma controlled with  Vicryl suture.  Blood loss about 400 cubic centimeters.  Postpartum, the  patient did well and was discharged on the second postpartum day.   LABORATORY DATA:  Initial hemoglobin 12.1, hematocrit 34.7, white count  12,700 with platelet count 415,000.  Follow-up hemoglobin 9.9.  RPR was  nonreactive.   FINAL DIAGNOSIS:  Intrauterine pregnancy at 36+ weeks,  delivered vertex.   OPERATION:  Spontaneous delivery, vertex, repair of second degree midline  laceration.   FINAL CONDITION:  Improved.   DISCHARGE INSTRUCTIONS:  Our regular discharge instruction booklet, Tylenol  No. 3, #24 tablets one or two every 3-4 hours as needed for pain, one  refill.  The patient is advised to return to the office in six weeks for  follow-up examination.      TFH/MEDQ  D:  09/09/2004  T:  09/09/2004  Job:  [id number]

## 2011-03-11 ENCOUNTER — Encounter: Payer: Self-pay | Admitting: Internal Medicine

## 2011-03-23 ENCOUNTER — Encounter: Payer: Self-pay | Admitting: Internal Medicine

## 2011-05-11 ENCOUNTER — Other Ambulatory Visit: Payer: Self-pay | Admitting: *Deleted

## 2011-05-11 MED ORDER — BUPROPION HCL ER (XL) 150 MG PO TB24: 150.0000 mg | ORAL_TABLET | Freq: Every day | ORAL | Status: DC

## 2011-07-02 ENCOUNTER — Ambulatory Visit (INDEPENDENT_AMBULATORY_CARE_PROVIDER_SITE_OTHER): Payer: BC Managed Care – PPO | Admitting: Internal Medicine

## 2011-07-02 ENCOUNTER — Encounter: Payer: Self-pay | Admitting: Internal Medicine

## 2011-07-02 VITALS — BP 136/88 | HR 96 | Temp 98.8°F | Wt 172.0 lb

## 2011-07-02 DIAGNOSIS — J309 Allergic rhinitis, unspecified: Secondary | ICD-10-CM

## 2011-07-02 DIAGNOSIS — F411 Generalized anxiety disorder: Secondary | ICD-10-CM

## 2011-07-02 DIAGNOSIS — J019 Acute sinusitis, unspecified: Secondary | ICD-10-CM

## 2011-07-02 MED ORDER — LEVOFLOXACIN 250 MG PO TABS: 250.0000 mg | ORAL_TABLET | Freq: Every day | ORAL | Status: DC

## 2011-07-02 MED ORDER — HYDROCODONE-HOMATROPINE 5-1.5 MG/5ML PO SYRP: 5.0000 mL | ORAL_SOLUTION | Freq: Four times a day (QID) | ORAL | Status: AC | PRN

## 2011-07-02 MED ORDER — LEVOFLOXACIN 250 MG PO TABS: 250.0000 mg | ORAL_TABLET | Freq: Every day | ORAL | Status: AC

## 2011-07-02 NOTE — Patient Instructions (Signed)
Take all new medications as prescribed Continue all other medications as before  

## 2011-07-03 ENCOUNTER — Encounter: Payer: Self-pay | Admitting: Internal Medicine

## 2011-07-03 NOTE — Assessment & Plan Note (Signed)
Mild to mod, for antibx course,  to f/u any worsening symptoms or concerns 

## 2011-07-03 NOTE — Assessment & Plan Note (Signed)
Mild to mod, for restart zyrtec asd,  to f/u any worsening symptoms or concerns 

## 2011-07-03 NOTE — Progress Notes (Signed)
Subjective:    Patient ID: Jessica [name], female    DOB: 24-Nov-1965, 45 y.o.   MRN: [id number]  HPI  Here with 2 wks acute onset fever, facial pain, pressure, general weakness and malaise, and greenish d/c, with slight ST, but little to no cough and Pt denies chest pain, increased sob or doe, wheezing, orthopnea, PND, increased LE swelling, palpitations, dizziness or syncope.  Does have several wks ongoing nasal allergy symptoms with clear congestion, itch and sneeze, without fever, pain, ST, cough or wheezing.  Denies worsening depressive symptoms, suicidal ideation, or panic, though has ongoing anxiety. Past Medical History  Diagnosis Date  . DYSPLASTIC NEVUS, CHEST   . THROMBOCYTOSIS   . CYSTIC ACNE   . INSOMNIA   . ELEVATED BP READING WITHOUT DX HYPERTENSION   . DEPRESSION   . ALLERGIC RHINITIS CAUSE UNSPECIFIED   . ANXIETY DISORDER   . Mixed hyperlipidemia    Past Surgical History  Procedure Date  . Breast surgery 200    Breast reduction  . Deviated septum repair 1997    reports that she has never smoked. She does not have any smokeless tobacco history on file. She reports that she does not drink alcohol or use illicit drugs. family history includes Coronary artery disease in her paternal grandfather; Depression in her sister; Hyperlipidemia in her father; and Hypertension in her father. No Known Allergies Current Outpatient Prescriptions on File Prior to Visit  Medication Sig Dispense Refill  . ALPRAZolam (XANAX) 0.5 MG tablet Take 0.5 mg by mouth every 8 (eight) hours as needed.        Marland Kitchen buPROPion (WELLBUTRIN XL) 150 MG 24 hr tablet Take 1 tablet (150 mg total) by mouth daily.  90 tablet  0  . cetirizine (ZYRTEC) 10 MG tablet Take 10 mg by mouth as needed.        . clindamycin-benzoyl peroxide (BENZACLIN) gel Apply topically at bedtime.        . drospirenone-ethinyl estradiol (YAZ) 3-0.02 MG per tablet Take 1 tablet by mouth daily.        Marland Kitchen ibuprofen (ADVIL,MOTRIN) 200  MG tablet Take 200 mg by mouth as needed. Take 2 tablets as needed      . Melatonin 3 MG TABS Take by mouth at bedtime as needed.       Marland Kitchen Phenylephrine HCl 5 MG TABS Take by mouth as needed.        . ranitidine (ZANTAC) 150 MG tablet Take 150 mg by mouth at bedtime.         Review of Systems Review of Systems  Constitutional: Negative for diaphoresis and unexpected weight change.  HENT: Negative for drooling and tinnitus.   Eyes: Negative for photophobia and visual disturbance.  Respiratory: Negative for choking and stridor.   Gastrointestinal: Negative for vomiting and blood in stool.  Genitourinary: Negative for hematuria and decreased urine volume.       Objective:   Physical Exam BP 136/88  Pulse 96  Temp(Src) 98.8 F (37.1 C) (Oral)  Wt 172 lb (78.019 kg)  SpO2 98% Physical Exam  VS noted, mild ill Constitutional: Pt appears well-developed and well-nourished.  HENT: Head: Normocephalic.  Right Ear: External ear normal.  Left Ear: External ear normal.  Bilat tm's mild erythema.  Sinus tender bilat.  Pharynx mild erythema Eyes: Conjunctivae and EOM are normal. Pupils are equal, round, and reactive to light.  Neck: Normal range of motion. Neck supple.  Cardiovascular: Normal rate and regular rhythm.  Pulmonary/Chest: Effort normal and breath sounds normal.  Neurological: Pt is alert. No cranial nerve deficit.  Skin: Skin is warm. No erythema.  Psychiatric: Pt behavior is normal. Thought content normal. 1+ nervous        Assessment & Plan:

## 2011-07-03 NOTE — Assessment & Plan Note (Signed)
stable overall by hx and exam,t, and pt to continue medical treatment as before   

## 2011-08-23 ENCOUNTER — Other Ambulatory Visit: Payer: Self-pay | Admitting: *Deleted

## 2011-08-23 MED ORDER — BUPROPION HCL ER (XL) 150 MG PO TB24: 150.0000 mg | ORAL_TABLET | Freq: Every day | ORAL | Status: DC

## 2011-08-31 ENCOUNTER — Telehealth: Payer: Self-pay | Admitting: *Deleted

## 2011-08-31 DIAGNOSIS — M79673 Pain in unspecified foot: Secondary | ICD-10-CM

## 2011-08-31 NOTE — Telephone Encounter (Signed)
Pt states md sent her to see orthopedic md about 2 years ago or so. Wanting to get a referral so she can be able to get orthodics for shoes again. Did inform pt md is out of office today will be tomorrow with response....08/31/11@4 :44pm/LMB

## 2011-09-02 NOTE — Telephone Encounter (Signed)
Notified pt with md response...09/02/11@12 :24pm/lmb

## 2011-09-02 NOTE — Telephone Encounter (Signed)
Will refer to [facility] for orthotic fitting as needed - thanks

## 2011-09-21 ENCOUNTER — Ambulatory Visit (INDEPENDENT_AMBULATORY_CARE_PROVIDER_SITE_OTHER): Payer: BC Managed Care – PPO | Admitting: Sports Medicine

## 2011-09-21 ENCOUNTER — Encounter: Payer: Self-pay | Admitting: Sports Medicine

## 2011-09-21 VITALS — BP 139/78 | HR 102 | Ht 63.0 in | Wt 170.0 lb

## 2011-09-21 DIAGNOSIS — M79671 Pain in right foot: Secondary | ICD-10-CM | POA: Insufficient documentation

## 2011-09-21 DIAGNOSIS — M79609 Pain in unspecified limb: Secondary | ICD-10-CM

## 2011-09-21 MED ORDER — KETOPROFEN POWD: Status: DC

## 2011-09-21 NOTE — Patient Instructions (Addendum)
Use the arch strap. Continue use if this helps midfoot pain  Use the topical ketoprofen for pain.  Use the adjusted orthotics. If this relieves pain, continue use for the next 9-12 months  If your pain resolves with this pain, then follow up with [place] as needed.

## 2011-09-21 NOTE — Assessment & Plan Note (Addendum)
Midfoot pain noted at tarsal metatarsal on border. Will place patient in midfoot archs trap. Previous custom orthotics modified for increased arch  support. Topical ketoprofen gel for pain relief. Patient instructed to advance activity slowly. Will followup as needed.  We added a low EVA E. medium density base to each orthotic the old bases had worn down  Walking and running gait was 3 comfortable once this was added She has much better control of pronation  With this modification she may get an additional 1-2 years out of her orthotics but if not we will be happy to make her a another pair

## 2011-09-23 NOTE — Progress Notes (Signed)
  Subjective:    Patient ID: Jessica [name], female    DOB: May 28, 1966, 46 y.o.   MRN: [id number]  HPI This is a patient who 2 years ago had significant foot pain. She saw Dr. Althea Charon. She was placed in orthotics and did very well. However for the past 2 months she's been having a new foot pain primarily in her right foot and less in the left foot. It occurs directly over her dorsal and she thought maybe her shoes were too tight. Change of shoe and use of ibuprofen did not help.  She likes to run about 20 miles per week. She has not been able to do that recently and so she comes for evaluation.   Review of Systems     Objective:   Physical Exam  Pleasant female in no acute distress  She has tenderness at the tarsometatarsal border on the dorsum of the right foot No tenderness over the left foot There is moderate collapse of the longitudinal arch bilaterally with the right greater than left There is some midfoot rotation on the right but no metatarsal tarsal bossing  The base on the current orthotics as worn out and when she is walking in these she no longer controls her pronation      Assessment & Plan:

## 2011-10-28 ENCOUNTER — Ambulatory Visit (INDEPENDENT_AMBULATORY_CARE_PROVIDER_SITE_OTHER): Payer: BC Managed Care – PPO | Admitting: Internal Medicine

## 2011-10-28 ENCOUNTER — Encounter: Payer: Self-pay | Admitting: Internal Medicine

## 2011-10-28 VITALS — BP 128/80 | HR 83 | Temp 99.6°F | Ht 63.0 in | Wt 174.0 lb

## 2011-10-28 DIAGNOSIS — J209 Acute bronchitis, unspecified: Secondary | ICD-10-CM

## 2011-10-28 DIAGNOSIS — J069 Acute upper respiratory infection, unspecified: Secondary | ICD-10-CM | POA: Insufficient documentation

## 2011-10-28 MED ORDER — PROMETHAZINE-CODEINE 6.25-10 MG/5ML PO SYRP: 5.0000 mL | ORAL_SOLUTION | ORAL | Status: AC | PRN

## 2011-10-28 MED ORDER — AZITHROMYCIN 250 MG PO TABS: ORAL_TABLET | ORAL | Status: AC

## 2011-10-28 NOTE — Progress Notes (Signed)
  Subjective:    Patient ID: Jessica [name], female    DOB: 1965-10-07, 46 y.o.   MRN: [id number]  HPI  Here with acute onset mild to mod 2-3 days ST, HA, general weakness and malaise, with prod cough greenish sputum, but Pt denies chest pain, increased sob or doe, wheezing, orthopnea, PND, increased LE swelling, palpitations, dizziness or syncope.  Began approx 1 wk ago overall with URI symtpoms, mild, then hoarseness, then above for last 3 days.   Pt denies polydipsia, polyuria.  Has appt to f/u with Dr Felicity Coyer next wk.No sick contacts. Flying out of town tomorrow.  Has 2 small children not currently ill Past Medical History  Diagnosis Date  . DYSPLASTIC NEVUS, CHEST   . THROMBOCYTOSIS   . CYSTIC ACNE   . INSOMNIA   . ELEVATED BP READING WITHOUT DX HYPERTENSION   . DEPRESSION   . ALLERGIC RHINITIS CAUSE UNSPECIFIED   . ANXIETY DISORDER   . Mixed hyperlipidemia    Past Surgical History  Procedure Date  . Breast surgery 200    Breast reduction  . Deviated septum repair 1997    reports that she has never smoked. She has never used smokeless tobacco. She reports that she does not drink alcohol or use illicit drugs. family history includes Coronary artery disease in her paternal grandfather; Depression in her sister; Hyperlipidemia in her father; and Hypertension in her father. No Known Allergies Current Outpatient Prescriptions on File Prior to Visit  Medication Sig Dispense Refill  . ALPRAZolam (XANAX) 0.5 MG tablet Take 0.5 mg by mouth every 8 (eight) hours as needed.        Marland Kitchen buPROPion (WELLBUTRIN XL) 150 MG 24 hr tablet Take 1 tablet (150 mg total) by mouth daily.  90 tablet  1  . cetirizine (ZYRTEC) 10 MG tablet Take 10 mg by mouth as needed.        . clindamycin-benzoyl peroxide (BENZACLIN) gel Apply topically at bedtime.        . drospirenone-ethinyl estradiol (YAZ) 3-0.02 MG per tablet Take 1 tablet by mouth daily.        Marland Kitchen ibuprofen (ADVIL,MOTRIN) 200 MG tablet Take 200 mg  by mouth as needed. Take 2 tablets as needed      . Melatonin 3 MG TABS Take by mouth at bedtime as needed.       Marland Kitchen Phenylephrine HCl 5 MG TABS Take by mouth as needed.        . ranitidine (ZANTAC) 150 MG tablet Take 150 mg by mouth at bedtime.         Review of Systems All otherwise neg per pt  Objective:   Physical Exam BP 128/80  Pulse 83  Temp(Src) 99.6 F (37.6 C) (Oral)  Ht 5\' 3"  (1.6 m)  Wt 174 lb (78.926 kg)  BMI 30.82 kg/m2  SpO2 95% Physical Exam  VS noted, mild ill Constitutional: Pt appears well-developed and well-nourished. Lavella Lemons HENT: Head: Normocephalic.  Right Ear: External ear normal.  Left Ear: External ear normal. Bilat tm's mild erythema.  Sinus nontender.  Pharynx mild erythema Eyes: Conjunctivae and EOM are normal. Pupils are equal, round, and reactive to light.  Neck: Normal range of motion. Neck supple.  Cardiovascular: Normal rate and regular rhythm.   Pulmonary/Chest: Effort normal and breath sounds normal.  Skin: Skin is warm. No erythema.  Psychiatric: Pt behavior is normal. Thought content normal.     Assessment & Plan:

## 2011-10-28 NOTE — Patient Instructions (Signed)
Take all new medications as prescribed Continue all other medications as before  

## 2011-10-28 NOTE — Assessment & Plan Note (Signed)
Mild to mod, for antibx course,  to f/u any worsening symptoms or concerns, also cough med prn 

## 2011-11-10 ENCOUNTER — Telehealth: Payer: Self-pay | Admitting: *Deleted

## 2011-11-10 ENCOUNTER — Other Ambulatory Visit: Payer: Self-pay | Admitting: Internal Medicine

## 2011-11-10 DIAGNOSIS — Z0000 Encounter for general adult medical examination without abnormal findings: Secondary | ICD-10-CM

## 2011-11-10 NOTE — Telephone Encounter (Signed)
Received staff msg pt made cpx for Friday morning want labs entered in epic. Will enter labs in order encounter for cpx labs... 11/10/11@2 :28pm/LMB

## 2011-11-10 NOTE — Telephone Encounter (Signed)
Message copied by Deatra James on Wed Nov 10, 2011  2:27 PM ------      Message from: Etheleen Sia      Created: Wed Nov 10, 2011  2:09 PM      Regarding: PHYSICAL LABS        COMING FRI MORNING FOR LABS PRIOR TO 8:00 APPT

## 2011-11-12 ENCOUNTER — Ambulatory Visit (INDEPENDENT_AMBULATORY_CARE_PROVIDER_SITE_OTHER): Payer: BC Managed Care – PPO | Admitting: Internal Medicine

## 2011-11-12 ENCOUNTER — Encounter: Payer: Self-pay | Admitting: Internal Medicine

## 2011-11-12 ENCOUNTER — Other Ambulatory Visit (INDEPENDENT_AMBULATORY_CARE_PROVIDER_SITE_OTHER): Payer: BC Managed Care – PPO

## 2011-11-12 VITALS — BP 110/78 | HR 85 | Temp 98.6°F | Ht 63.0 in | Wt 175.0 lb

## 2011-11-12 DIAGNOSIS — Z0000 Encounter for general adult medical examination without abnormal findings: Secondary | ICD-10-CM

## 2011-11-12 DIAGNOSIS — E782 Mixed hyperlipidemia: Secondary | ICD-10-CM

## 2011-11-12 DIAGNOSIS — F329 Major depressive disorder, single episode, unspecified: Secondary | ICD-10-CM

## 2011-11-12 LAB — URINALYSIS, ROUTINE W REFLEX MICROSCOPIC
Hgb urine dipstick: NEGATIVE
Ketones, ur: NEGATIVE
Leukocytes, UA: NEGATIVE
Nitrite: NEGATIVE
Urobilinogen, UA: 0.2 (ref 0.0–1.0)

## 2011-11-12 LAB — CBC WITH DIFFERENTIAL/PLATELET
Basophils Absolute: 0 10*3/uL (ref 0.0–0.1)
Basophils Relative: 0.4 % (ref 0.0–3.0)
HCT: 37.7 % (ref 36.0–46.0)
Lymphs Abs: 1.9 10*3/uL (ref 0.7–4.0)
MCHC: 33.9 g/dL (ref 30.0–36.0)
Monocytes Absolute: 0.6 10*3/uL (ref 0.1–1.0)
Monocytes Relative: 9.6 % (ref 3.0–12.0)
Neutro Abs: 3.8 10*3/uL (ref 1.4–7.7)
Neutrophils Relative %: 58.5 % (ref 43.0–77.0)
RDW: 12.9 % (ref 11.5–14.6)

## 2011-11-12 LAB — LIPID PANEL
Triglycerides: 187 mg/dL — ABNORMAL HIGH (ref 0.0–149.0)
VLDL: 37.4 mg/dL (ref 0.0–40.0)

## 2011-11-12 LAB — LDL CHOLESTEROL, DIRECT: Direct LDL: 162.3 mg/dL

## 2011-11-12 LAB — HEPATIC FUNCTION PANEL
Alkaline Phosphatase: 81 U/L (ref 39–117)
Bilirubin, Direct: 0.1 mg/dL (ref 0.0–0.3)

## 2011-11-12 LAB — BASIC METABOLIC PANEL
BUN: 14 mg/dL (ref 6–23)
Chloride: 103 mEq/L (ref 96–112)
Glucose, Bld: 82 mg/dL (ref 70–99)
Sodium: 136 mEq/L (ref 135–145)

## 2011-11-12 LAB — TSH: TSH: 2.53 u[IU]/mL (ref 0.35–5.50)

## 2011-11-12 MED ORDER — BETAMETHASONE DIPROPIONATE AUG 0.05 % EX CREA: TOPICAL_CREAM | Freq: Two times a day (BID) | CUTANEOUS | Status: DC

## 2011-11-12 MED ORDER — BUPROPION HCL ER (XL) 150 MG PO TB24: 150.0000 mg | ORAL_TABLET | Freq: Every day | ORAL | Status: DC

## 2011-11-12 NOTE — Patient Instructions (Signed)
It was good to see you today. Health Maintenance reviewed - all recommended screening and immunizations are up to date.  Test(s) ordered today. Your results will be called to you after review (48-72hours after test completion). If any changes need to be made, you will be notified at that time. Continue to work on lifestyle changes as discussed (low fat, low carb, increased protein diet; improved exercise efforts; weight loss) to control sugar, blood pressure and cholesterol levels and/or reduce risk of developing other medical problems. Look into [email or web address] or other type of food journal to assist you in this process. Medications reviewed, no changes at this time. Refill on medication(s) as discussed today. Please schedule followup in 1 year for physical and labs, call sooner if problems.

## 2011-11-12 NOTE — Assessment & Plan Note (Signed)
Working on diet, exercise and omega 3 supplements as she has previously declined statin tx -    continue present plan and medications.

## 2011-11-12 NOTE — Assessment & Plan Note (Signed)
Well controlled on current meds (buproprion since fall 2011)

## 2011-11-12 NOTE — Progress Notes (Signed)
Subjective:    Patient ID: Jessica [name], female    DOB: 1965-09-22, 46 y.o.   MRN: [id number]  HPI patient is here today for annual physical. Patient feels well and has no complaints.  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 DYSPLASTIC NEVUS, CHEST   . THROMBOCYTOSIS   . CYSTIC ACNE   . INSOMNIA   . ELEVATED BP READING WITHOUT DX HYPERTENSION   . DEPRESSION   . ALLERGIC RHINITIS CAUSE UNSPECIFIED   . ANXIETY DISORDER   . Mixed hyperlipidemia    Family History  Problem Relation Age of Onset  . Hyperlipidemia Father   . Hypertension Father   . Depression Sister   . Coronary artery disease Paternal Grandfather    History  Substance Use Topics  . Smoking status: Never Smoker   . Smokeless tobacco: Never Used   Comment: Married lives with spouse and two kids. She is originally from [place]-moved to [facility] 2004  . Alcohol Use: No     Review of Systems  Constitutional: Negative for fever and unexpected weight change.  Respiratory: Negative for shortness of breath and wheezing.   Cardiovascular: Negative  for chest pain.  Neurological: Negative for headaches.  Gastrointestinal: Negative for abdominal pain, no bowel changes.  Musculoskeletal: Negative for gait problem or joint swelling. Ongoing treatment of collapsing arch on R foot Skin: Negative for rash.  Neurological: Negative for dizziness or headache.  No other specific complaints in a complete review of systems (except as listed in HPI above).      Objective:   Physical Exam BP 110/78  Pulse 85  Temp(Src) 98.6 F (37 C) (Oral)  Wt 175 lb (79.379 kg)  SpO2 96% Wt Readings from Last 3 Encounters:  11/12/11 175 lb (79.379 kg)  10/28/11 174 lb (78.926 kg)  09/21/11 170 lb (77.111 kg)   Constitutional: She is overweight, but appears well-developed and well-nourished. No distress.  HENT: Head: Normocephalic and atraumatic. Ears: B TMs ok, no erythema or effusion; Nose: Nose normal. Mouth/Throat: Oropharynx is clear and moist. No oropharyngeal exudate.  Eyes: Conjunctivae and EOM are normal. Pupils are equal, round, and reactive to light. No scleral icterus.  Neck: Normal range of motion. Neck supple. No JVD present. No thyromegaly present.  Cardiovascular: Normal rate, regular rhythm and normal heart sounds.  No murmur heard. No BLE edema. Pulmonary/Chest: Effort normal and breath sounds normal. No respiratory distress. She has no wheezes.  Abdominal: Soft. Bowel sounds are normal. She exhibits no distension. There is no tenderness. no masses Musculoskeletal: Normal range of motion, no joint effusions. No gross  deformities Neurological: She is alert and oriented to person, place, and time. No cranial nerve deficit. Coordination normal.  Skin: Skin is warm and dry. No rash noted. No erythema.  Psychiatric: She has a normal mood and affect. Her behavior is normal. Judgment and thought content normal.   Lab Results  Component Value Date   WBC 7.1 06/16/2010   HGB 12.8 06/16/2010   HCT 36.3 06/16/2010   PLT 402.0* 06/16/2010   CHOL  266* 06/16/2010   TRIG 212.0* 06/16/2010   HDL 75.80 06/16/2010   LDLDIRECT 151.0 06/16/2010   ALT 18 06/16/2010   AST 17 06/16/2010   NA 138 06/16/2010   K 4.8 06/16/2010   CL 103 06/16/2010   CREATININE 0.6 06/16/2010   BUN 12 06/16/2010   CO2 29 06/16/2010   TSH 2.39 06/16/2010   EKG: 85 bpm - NSR, no ischemic change or arrythimas     Assessment & Plan:  CPX/v70.0 - Patient has been counseled on age-appropriate routine health concerns for screening and prevention. These are reviewed and up-to-date. Immunizations are up-to-date or declined. Labs pending, to be reviewed, and ECG reviewed.  Also see problem list. Medications and labs reviewed today.

## 2011-11-15 ENCOUNTER — Other Ambulatory Visit: Payer: Self-pay | Admitting: *Deleted

## 2011-11-15 MED ORDER — SIMVASTATIN 20 MG PO TABS: 20.0000 mg | ORAL_TABLET | Freq: Every day | ORAL | Status: DC

## 2011-11-15 NOTE — Telephone Encounter (Signed)
Notified pt with labs results. Md starting pt on simvastatin 20 mg take 1 daily. Pt [facility].... 11/15/11@2 :35pm/LMB

## 2011-11-17 ENCOUNTER — Other Ambulatory Visit: Payer: Self-pay | Admitting: Obstetrics and Gynecology

## 2011-11-17 DIAGNOSIS — Z1231 Encounter for screening mammogram for malignant neoplasm of breast: Secondary | ICD-10-CM

## 2011-12-22 ENCOUNTER — Ambulatory Visit
Admission: RE | Admit: 2011-12-22 | Discharge: 2011-12-22 | Disposition: A | Discharge status: Home / Self Care | Payer: BC Managed Care – PPO | Source: Ambulatory Visit | Attending: Obstetrics and Gynecology | Admitting: Obstetrics and Gynecology

## 2011-12-22 ENCOUNTER — Telehealth: Payer: Self-pay | Admitting: *Deleted

## 2011-12-22 DIAGNOSIS — Z1231 Encounter for screening mammogram for malignant neoplasm of breast: Secondary | ICD-10-CM

## 2011-12-22 MED ORDER — ALPRAZOLAM 0.5 MG PO TABS: 0.5000 mg | ORAL_TABLET | Freq: Three times a day (TID) | ORAL | Status: DC | PRN

## 2011-12-22 NOTE — Telephone Encounter (Signed)
Notified pt rx [facility]ed [facility].... 12/23/11@1 :17pm/LMB

## 2011-12-22 NOTE — Telephone Encounter (Signed)
ok 

## 2011-12-22 NOTE — Telephone Encounter (Signed)
Left msg on vm needing rew rx on her alprazolam sent [facility]... 12/23/11@10 :58am/LMB

## 2012-03-17 ENCOUNTER — Other Ambulatory Visit: Payer: Self-pay | Admitting: *Deleted

## 2012-03-17 MED ORDER — SIMVASTATIN 20 MG PO TABS: 20.0000 mg | ORAL_TABLET | Freq: Every day | ORAL | Status: DC

## 2012-04-14 ENCOUNTER — Encounter: Payer: Self-pay | Admitting: Internal Medicine

## 2012-04-14 ENCOUNTER — Ambulatory Visit (INDEPENDENT_AMBULATORY_CARE_PROVIDER_SITE_OTHER): Payer: BC Managed Care – PPO | Admitting: Internal Medicine

## 2012-04-14 VITALS — BP 112/82 | HR 82 | Temp 98.9°F | Ht 63.0 in | Wt 171.1 lb

## 2012-04-14 DIAGNOSIS — B373 Candidiasis of vulva and vagina: Secondary | ICD-10-CM

## 2012-04-14 MED ORDER — ALPRAZOLAM 0.5 MG PO TABS: 0.5000 mg | ORAL_TABLET | Freq: Three times a day (TID) | ORAL | Status: DC | PRN

## 2012-04-14 MED ORDER — FLUCONAZOLE 150 MG PO TABS: 150.0000 mg | ORAL_TABLET | Freq: Once | ORAL | Status: AC

## 2012-04-14 NOTE — Progress Notes (Signed)
  Subjective:    Patient ID: Jessica [name], female    DOB: [birth year], 46 y.o.   MRN: [id number]  HPI  complains of vaginal itching and "cottage cheese" discharge  Past Medical History  Diagnosis Date  . DYSPLASTIC NEVUS, CHEST   . THROMBOCYTOSIS   . CYSTIC ACNE   . INSOMNIA   . ELEVATED BP READING WITHOUT DX HYPERTENSION   . DEPRESSION   . ALLERGIC RHINITIS CAUSE UNSPECIFIED   . ANXIETY DISORDER   . Mixed hyperlipidemia     Review of Systems  Constitutional: Negative for fever and fatigue.  Genitourinary: Positive for vaginal discharge. Negative for vaginal bleeding.       Objective:   Physical Exam BP 112/82  Pulse 82  Temp 98.9 F (37.2 C) (Oral)  Ht 5\' 3"  (1.6 m)  Wt 171 lb 1.9 oz (77.62 kg)  BMI 30.31 kg/m2  SpO2 97% Wt Readings from Last 3 Encounters:  04/14/12 171 lb 1.9 oz (77.62 kg)  11/12/11 175 lb (79.379 kg)  10/28/11 174 lb (78.926 kg)   Constitutional: She is overweight, appears well-developed and well-nourished. No distress Cardiovascular: Normal rate, regular rhythm and normal heart sounds.  No murmur heard. No BLE edema. Pulmonary/Chest: Effort normal and breath sounds normal. No respiratory distress. She has no wheezes.  GU - defer Skin: Skin is warm and dry. No rash noted. No erythema.  Psychiatric: She has a normal mood and affect. Her behavior is normal. Judgment and thought content normal.   Lab Results  Component Value Date   WBC 6.5 11/12/2011   HGB 12.8 11/12/2011   HCT 37.7 11/12/2011   PLT 398.0 11/12/2011   GLUCOSE 82 11/12/2011   CHOL 276* 11/12/2011   TRIG 187.0* 11/12/2011   HDL 82.10 11/12/2011   LDLDIRECT 162.3 11/12/2011   ALT 22 11/12/2011   AST 16 11/12/2011   NA 136 11/12/2011   K 4.1 11/12/2011   CL 103 11/12/2011   CREATININE 0.7 11/12/2011   BUN 14 11/12/2011   CO2 27 11/12/2011   TSH 2.53 11/12/2011       Assessment & Plan:   Vaginitis - classically yeast - tx with diflucan and topical antifungal prn

## 2012-04-14 NOTE — Patient Instructions (Signed)
It was good to see you today. Use Diflucan pill for yeast symptoms - may repeat if needed Your prescription(s) have been submitted to your pharmacy. Please take as directed and contact our office if you believe you are having problem(s) with the medication(s). Also may use monistat cream for external irritation   Vaginitis Vaginitis is when the vagina is sore, puffy (swollen), and red (inflamed). HOME CARE  Take your medicine as told. Finish them even if you start to feel better.   Do not have sex (intercourse) until treatment is done or as told by your doctor.   Take warm baths or sit in warm water (sitz bath).   Do not douche.   Do not use tampons, especially scented ones.   Wear cotton underwear.   Do not wear tight pants or pantyhose.   Tell your sex partner that you have a yeast infection. Your partner should see a doctor if symptoms appear, such as a mild rash or itching.   Your sex partner should be treated if your infection is hard to get rid of.   Use a cream to stop the itching. Make sure it is approved by your doctor.  GET HELP RIGHT AWAY IF:    The infection does not go away with treatment.   You have a temperature by mouth above 102 F (38.9 C).   You have belly (abdominal) pain.   You have bleeding from the vagina.  MAKE SURE YOU:    Understand these instructions.   Will watch this condition.   Will get help right away if you are not doing well or get worse.  Document Released: 10/22/2008 Document Revised: 07/15/2011 Document Reviewed: 10/22/2008 [facility] Patient Information 2012 [place], [place].

## 2012-05-09 ENCOUNTER — Ambulatory Visit (INDEPENDENT_AMBULATORY_CARE_PROVIDER_SITE_OTHER): Payer: BC Managed Care – PPO | Admitting: Sports Medicine

## 2012-05-09 ENCOUNTER — Encounter: Payer: Self-pay | Admitting: Sports Medicine

## 2012-05-09 VITALS — BP 119/83 | HR 85 | Ht 63.0 in | Wt 171.0 lb

## 2012-05-09 DIAGNOSIS — M79671 Pain in right foot: Secondary | ICD-10-CM

## 2012-05-09 DIAGNOSIS — R269 Unspecified abnormalities of gait and mobility: Secondary | ICD-10-CM

## 2012-05-09 DIAGNOSIS — M79609 Pain in unspecified limb: Secondary | ICD-10-CM

## 2012-05-09 NOTE — Patient Instructions (Addendum)
Try new correction of your orthotics- if you do not tolerate the white pad on top it is ok to take this off  You have been scheduled for an appointment for new orthotics on 05/16/12 with Dr. Katrinka Blazing at 3:45pm   Thank you for seeing [place] today!

## 2012-05-09 NOTE — Progress Notes (Signed)
Patient ID: Jessica [name], female   DOB: Aug 23, 1965, 46 y.o.   MRN: [id number]  This patient returns for evaluation of her feet Family saw her 6 months ago we added nutrition to her orthotics She was having pain over the dorsum of her midfoot bilaterally and this went away fairly quickly with the change in the orthotics Her current orthotics are 44 years old and are feeling less cushioned She also has some forefoot symptoms She comes for evaluation to see if we should modify the orthotics or perhaps make a change  Standing reveals loss of the longitudinal arch She also has some loss of the transverse arch bilaterally No abnormal callus pattern Walking reveals calcaneal valgus and pronation without correction When she is placed in the orthotics this corrects

## 2012-05-09 NOTE — Assessment & Plan Note (Signed)
Her pain is much less with modifications in her orthotics  Because she is starting to get some return of pain and forefoot pain we will try using metatarsal pads and first ray post for a trial

## 2012-05-09 NOTE — Assessment & Plan Note (Signed)
Corrections to her older orthotics today but I think she needs a new pair  She will return for those and we will try to unload the medial side and correct her calcaneal valgus

## 2012-05-16 ENCOUNTER — Ambulatory Visit (INDEPENDENT_AMBULATORY_CARE_PROVIDER_SITE_OTHER): Payer: BC Managed Care – PPO | Admitting: Family Medicine

## 2012-05-16 DIAGNOSIS — M79609 Pain in unspecified limb: Secondary | ICD-10-CM

## 2012-05-16 DIAGNOSIS — M79671 Pain in right foot: Secondary | ICD-10-CM

## 2012-05-16 DIAGNOSIS — R269 Unspecified abnormalities of gait and mobility: Secondary | ICD-10-CM

## 2012-05-16 NOTE — Progress Notes (Signed)
Patient is here for orthotics. Patient was seen previously in February 2013 when she had her old orthotics adjusted. Patient states since that time she's had improvement in symptoms. She states that the pain she was having over the dorsum of the foot has improved. She has continued to be able to run as well as doing her [employer] classes. Patient though feels that is worn out and she is starting to have some of the symptoms again and she would like new orthotics.   Physical Exam   Pleasant female in no acute distress  She has tenderness at the tarsometatarsal border on the dorsum of the right foot but improved from last visit.  No tenderness over the left foot  There is moderate collapse of the longitudinal arch bilaterally with the right greater than left  There is some midfoot rotation on the right but no metatarsal tarsal bossing  + pronation of the ankles bilaterally.  No hindfoot abnormalities.

## 2012-05-16 NOTE — Assessment & Plan Note (Addendum)
Patient was fitted for a : standard, cushioned, semi-rigid orthotic. The orthotic was heated and afterward the patient stood on the orthotic blank positioned on the orthotic stand. The patient was positioned in subtalar neutral position and 10 degrees of ankle dorsiflexion in a weight bearing stance. After completion of molding, a stable base was applied to the orthotic blank. The blank was ground to a stable position for weight bearing. Size:7  Base:EVA longer though to give mid foot more padding Posting:1st ray Additional orthotic padding:as stated above with X-small MT pads.   Patient did ambulate in the orthotics and had significant improvement with no pronation. Neutral position. Patient does show signs of weak hip abduction which we did discuss as well. Patient will followup as needed over the course of the next 2 weeks for any adjustments. Otherwise patient will followup with [place] on an as-needed basis.

## 2012-08-30 ENCOUNTER — Other Ambulatory Visit: Payer: Self-pay | Admitting: *Deleted

## 2012-08-30 MED ORDER — SIMVASTATIN 20 MG PO TABS: 20.0000 mg | ORAL_TABLET | Freq: Every day | ORAL | Status: DC

## 2012-10-18 ENCOUNTER — Other Ambulatory Visit: Payer: Self-pay | Admitting: *Deleted

## 2012-10-18 MED ORDER — BETAMETHASONE DIPROPIONATE AUG 0.05 % EX CREA: TOPICAL_CREAM | Freq: Two times a day (BID) | CUTANEOUS | Status: DC | PRN

## 2012-11-09 ENCOUNTER — Other Ambulatory Visit: Payer: Self-pay | Admitting: *Deleted

## 2012-11-09 MED ORDER — BUPROPION HCL ER (XL) 150 MG PO TB24: 150.0000 mg | ORAL_TABLET | Freq: Every day | ORAL | Status: DC

## 2012-11-27 ENCOUNTER — Other Ambulatory Visit: Payer: Self-pay

## 2012-11-28 ENCOUNTER — Other Ambulatory Visit: Payer: Self-pay | Admitting: Obstetrics and Gynecology

## 2012-11-28 DIAGNOSIS — N644 Mastodynia: Secondary | ICD-10-CM

## 2012-12-08 ENCOUNTER — Ambulatory Visit
Admission: RE | Admit: 2012-12-08 | Discharge: 2012-12-08 | Disposition: A | Discharge status: Home / Self Care | Payer: BC Managed Care – PPO | Source: Ambulatory Visit | Attending: Obstetrics and Gynecology | Admitting: Obstetrics and Gynecology

## 2012-12-08 ENCOUNTER — Other Ambulatory Visit: Payer: Self-pay | Admitting: Obstetrics and Gynecology

## 2012-12-08 DIAGNOSIS — N644 Mastodynia: Secondary | ICD-10-CM

## 2012-12-14 ENCOUNTER — Telehealth: Payer: Self-pay | Admitting: *Deleted

## 2012-12-14 DIAGNOSIS — Z0000 Encounter for general adult medical examination without abnormal findings: Secondary | ICD-10-CM

## 2012-12-14 NOTE — Telephone Encounter (Signed)
Pt made cpx for Sept. Entering cpx labs...lmb

## 2012-12-14 NOTE — Telephone Encounter (Signed)
Message copied by Deatra James on Thu Dec 14, 2012  3:40 PM ------      Message from: Livingston Diones      Created: Thu Dec 14, 2012  3:23 PM       Please put in lab order for cpe 04/11/13 for a week prior.  ------

## 2012-12-19 ENCOUNTER — Other Ambulatory Visit: Payer: Self-pay | Admitting: *Deleted

## 2012-12-19 MED ORDER — BUPROPION HCL ER (XL) 150 MG PO TB24: 150.0000 mg | ORAL_TABLET | Freq: Every day | ORAL | Status: DC

## 2013-01-03 ENCOUNTER — Telehealth: Payer: Self-pay | Admitting: *Deleted

## 2013-01-03 ENCOUNTER — Encounter: Payer: Self-pay | Admitting: *Deleted

## 2013-01-03 ENCOUNTER — Ambulatory Visit: Payer: BC Managed Care – PPO | Admitting: Internal Medicine

## 2013-01-03 MED ORDER — CYCLOBENZAPRINE HCL 10 MG PO TABS: 10.0000 mg | ORAL_TABLET | Freq: Three times a day (TID) | ORAL | Status: DC | PRN

## 2013-01-03 NOTE — Telephone Encounter (Signed)
Flexeril sent to her local pharmacy

## 2013-01-03 NOTE — Telephone Encounter (Signed)
Pt informed of MD's advisement. 

## 2013-01-03 NOTE — Telephone Encounter (Signed)
Pt informed. Pt states that she read about possible interaction with taking Flexeril and Wellbutrin. Pt is asking for MD's advisement on whether she should stop taking Wellbutrin for a few days while on Flexeril or if she can take them together (she takes Wellbutrin in the morning and would take Flexeril in the evening)-please advise.

## 2013-01-03 NOTE — Telephone Encounter (Signed)
Pt has appointment today at 4:00pm for pulled back muscle. Pt wants to know if there are any appointments available sooner ( no available appointments sooner on schedule) or if a rx for a muscle relaxer can be sent in today before her appointment at 4:00pm.

## 2013-01-03 NOTE — Telephone Encounter (Signed)
I feel it is safe to take these medications together. She can discuss this further at her 4:00 appointment with Dr. Yetta Barre

## 2013-01-09 ENCOUNTER — Telehealth: Payer: Self-pay | Admitting: *Deleted

## 2013-01-09 MED ORDER — ALPRAZOLAM 0.5 MG PO TABS: 0.5000 mg | ORAL_TABLET | Freq: Three times a day (TID) | ORAL | Status: DC | PRN

## 2013-01-09 NOTE — Telephone Encounter (Signed)
Left msg on vm stating the flexeril is not agreeing with her. Making her feel nervous. She is ver anxious. Will be traveling tomorrow wanting to get refill on her alprazolam called into pharmacy in [place] @ [phone]...Raechel Chute

## 2013-01-09 NOTE — Telephone Encounter (Signed)
Notified pt verified pharmacy pt wanting rx sent to [facility] in [place]. Fax @ [phone]...lmb

## 2013-01-09 NOTE — Telephone Encounter (Signed)
Ok to refill 

## 2013-02-27 ENCOUNTER — Other Ambulatory Visit: Payer: Self-pay | Admitting: *Deleted

## 2013-02-27 MED ORDER — ALPRAZOLAM 0.5 MG PO TABS: 0.5000 mg | ORAL_TABLET | Freq: Three times a day (TID) | ORAL | Status: DC | PRN

## 2013-02-27 NOTE — Telephone Encounter (Signed)
MD out of office. Is this ok to refill.../lmb 

## 2013-02-27 NOTE — Telephone Encounter (Signed)
Faxed script bck to [facility].../lmb 

## 2013-04-04 ENCOUNTER — Other Ambulatory Visit: Payer: Self-pay | Admitting: *Deleted

## 2013-04-04 MED ORDER — BUPROPION HCL ER (XL) 150 MG PO TB24: 150.0000 mg | ORAL_TABLET | Freq: Every day | ORAL | Status: DC

## 2013-04-04 NOTE — Telephone Encounter (Signed)
Ok for refill pending her scheduled ROV thanks

## 2013-04-04 NOTE — Telephone Encounter (Signed)
Jessica [name] called requesting a refill of Wellbutrin, advised appointment needed prior to OV.  Pt states she has an appoint on 9.3.14 @8am .  Please advise

## 2013-04-05 MED ORDER — BUPROPION HCL ER (XL) 150 MG PO TB24: 150.0000 mg | ORAL_TABLET | Freq: Every day | ORAL | Status: DC

## 2013-04-05 NOTE — Telephone Encounter (Signed)
Called pt to verify if she received refill. Pt states no pharmacy stated hasn't been call in. Inform her will [facility] to [facility] on [address]...lmb

## 2013-04-11 ENCOUNTER — Ambulatory Visit (INDEPENDENT_AMBULATORY_CARE_PROVIDER_SITE_OTHER): Payer: BC Managed Care – PPO | Admitting: Family Medicine

## 2013-04-11 ENCOUNTER — Other Ambulatory Visit (INDEPENDENT_AMBULATORY_CARE_PROVIDER_SITE_OTHER): Payer: BC Managed Care – PPO

## 2013-04-11 ENCOUNTER — Encounter: Payer: Self-pay | Admitting: Internal Medicine

## 2013-04-11 ENCOUNTER — Ambulatory Visit (INDEPENDENT_AMBULATORY_CARE_PROVIDER_SITE_OTHER): Payer: BC Managed Care – PPO | Admitting: Internal Medicine

## 2013-04-11 ENCOUNTER — Encounter: Payer: Self-pay | Admitting: Family Medicine

## 2013-04-11 VITALS — BP 122/84 | HR 85 | Wt 169.0 lb

## 2013-04-11 VITALS — BP 120/82 | HR 75 | Temp 98.0°F | Ht 63.0 in | Wt 173.0 lb

## 2013-04-11 DIAGNOSIS — Z0000 Encounter for general adult medical examination without abnormal findings: Secondary | ICD-10-CM

## 2013-04-11 DIAGNOSIS — M6240 Contracture of muscle, unspecified site: Secondary | ICD-10-CM

## 2013-04-11 DIAGNOSIS — M76899 Other specified enthesopathies of unspecified lower limb, excluding foot: Secondary | ICD-10-CM

## 2013-04-11 DIAGNOSIS — M999 Biomechanical lesion, unspecified: Secondary | ICD-10-CM | POA: Insufficient documentation

## 2013-04-11 DIAGNOSIS — E782 Mixed hyperlipidemia: Secondary | ICD-10-CM

## 2013-04-11 DIAGNOSIS — F411 Generalized anxiety disorder: Secondary | ICD-10-CM

## 2013-04-11 DIAGNOSIS — Z23 Encounter for immunization: Secondary | ICD-10-CM

## 2013-04-11 DIAGNOSIS — M24559 Contracture, unspecified hip: Secondary | ICD-10-CM | POA: Insufficient documentation

## 2013-04-11 DIAGNOSIS — R269 Unspecified abnormalities of gait and mobility: Secondary | ICD-10-CM

## 2013-04-11 DIAGNOSIS — M7061 Trochanteric bursitis, right hip: Secondary | ICD-10-CM | POA: Insufficient documentation

## 2013-04-11 LAB — TSH: TSH: 1.97 u[IU]/mL (ref 0.35–5.50)

## 2013-04-11 LAB — CBC WITH DIFFERENTIAL/PLATELET
Basophils Absolute: 0 10*3/uL (ref 0.0–0.1)
Eosinophils Absolute: 0.1 10*3/uL (ref 0.0–0.7)
Hemoglobin: 13.5 g/dL (ref 12.0–15.0)
Lymphocytes Relative: 29 % (ref 12.0–46.0)
Lymphs Abs: 2.4 10*3/uL (ref 0.7–4.0)
MCHC: 34.7 g/dL (ref 30.0–36.0)
MCV: 83 fl (ref 78.0–100.0)
Monocytes Absolute: 0.8 10*3/uL (ref 0.1–1.0)
Neutro Abs: 4.9 10*3/uL (ref 1.4–7.7)
RDW: 12.3 % (ref 11.5–14.6)

## 2013-04-11 LAB — LIPID PANEL
Cholesterol: 218 mg/dL — ABNORMAL HIGH (ref 0–200)
VLDL: 60.8 mg/dL — ABNORMAL HIGH (ref 0.0–40.0)

## 2013-04-11 LAB — HEPATIC FUNCTION PANEL
AST: 18 U/L (ref 0–37)
Albumin: 3.4 g/dL — ABNORMAL LOW (ref 3.5–5.2)
Alkaline Phosphatase: 86 U/L (ref 39–117)
Bilirubin, Direct: 0 mg/dL (ref 0.0–0.3)
Total Protein: 7.3 g/dL (ref 6.0–8.3)

## 2013-04-11 LAB — BASIC METABOLIC PANEL
BUN: 11 mg/dL (ref 6–23)
Calcium: 9.7 mg/dL (ref 8.4–10.5)
Chloride: 100 mEq/L (ref 96–112)
Creatinine, Ser: 0.8 mg/dL (ref 0.4–1.2)
GFR: 88.84 mL/min (ref >60.00)

## 2013-04-11 LAB — URINALYSIS, ROUTINE W REFLEX MICROSCOPIC
Bilirubin Urine: NEGATIVE
Hgb urine dipstick: NEGATIVE
Ketones, ur: NEGATIVE
Nitrite: NEGATIVE

## 2013-04-11 MED ORDER — TIZANIDINE HCL 4 MG PO CAPS: 4.0000 mg | ORAL_CAPSULE | Freq: Three times a day (TID) | ORAL | Status: DC | PRN

## 2013-04-11 NOTE — Patient Instructions (Signed)
Sacroiliac Joint Mobilization and Rehab 1. Work on pretzel stretching, shoulder back and leg draped in front. 3-5 sets, 30 sec.. 2. hip abductor rotations. standing, hip flexion and rotation outward then inward. 3 sets, 15 reps. when can do comfortably, add ankle weights starting at 2 pounds.  3. cross over stretching - shoulder back to ground, same side leg crossover. 3-5 sets for 30 min..  4. rolling up and back knees to chest and rocking. 5. sacral tilt - 5 sets, hold for 5-10 seconds  Try the other exercises on the handout at least most days of the week Ibuprofen 600mg  3 times a day for 3 days if stomach can handle.  Come back in 3-4 weeks .

## 2013-04-11 NOTE — Progress Notes (Signed)
I'm seeing this patient by the request  of: Dr. Felicity Coyer  CC:  Back and right hip pain.   HPI: Patient is a very pleasant 47 year old female coming in with new onset of back pain and right hip pain. Patient states that this has been a couple months duration. Patient had do a lot of traveling including going to [place] for her 10th year wedding anniversary. Patient states since that time though she has noticed been having increasing back pain as well as right hip pain. Back pain she has had intermittently for quite some time. Patient has been going to a chiropractor in the past which did seem somewhat beneficial. Patient has noticed some mild weight gain which does seem to make it worse. Patient denies any significant radiation, denies any numbness or tingling in the extremities, denies any fevers or chills or any abnormal weight loss. Patient has gone to physical therapy for this before with some moderate improvement. Patient was taking ibuprofen fairly regularly but then had some stomach discomfort. Patient states that it seems to be worse with holding her kids and very busy days. Patient is a severity of 6/10.  Regarding her hip she has more lateral pain. Patient states that this can go down the lateral aspect of her leg. This can be tender to palpation along the lateral aspect of the hip. Patient denies any groin pain. Patient states that it hurts more when she is going uphill. Patient also states that it can hurt with some rotation. Patient denies any weakness in describes the pain as more of a chronic dull aching sensation. Patient has tried some stretching and has been beneficial.. Severity is 6/10  Past medical, surgical, family and social history reviewed. Medications reviewed all in the electronic medical record.   Review of Systems: No headache, visual changes, nausea, vomiting, diarrhea, constipation, dizziness, abdominal pain, skin rash, fevers, chills, night sweats, weight loss, swollen  lymph nodes, body aches, joint swelling, muscle aches, chest pain, shortness of breath, mood changes.   Objective:    Blood pressure 122/84, pulse 85, weight 169 lb (76.658 kg), SpO2 97.00%.   General: No apparent distress alert and oriented x3 mood and affect normal, dressed appropriately.  HEENT: Pupils equal, extraocular movements intact Respiratory: Patient's speak in full sentences and does not appear short of breath Cardiovascular: No lower extremity edema, non tender, no erythema Skin: Warm dry intact with no signs of infection or rash on extremities or on axial skeleton. Abdomen: Soft nontender Neuro: Cranial nerves II through XII are intact, neurovascularly intact in all extremities with 2+ DTRs and 2+ pulses. Lymph: No lymphadenopathy of posterior or anterior cervical chain or axillae bilaterally.  Gait normal with good balance and coordination.  MSK: Non tender with full range of motion and good stability and symmetric strength and tone of shoulders, elbows, wrist, hip, knee and ankles bilaterally.  Back Exam:  Inspection: Unremarkable  Motion: Flexion 45 deg, Extension 45 deg, Side Bending to 45 deg bilaterally,  Rotation to 45 deg bilaterally  SLR laying: Negative  XSLR laying: Negative  Palpable tenderness: None. FABER: Positive right Sensory change: Gross sensation intact to all lumbar and sacral dermatomes.  Reflexes: 2+ at both patellar tendons, 2+ at achilles tendons, Babinski's downgoing.  Strength at foot  Plantar-flexion: 5/5 Dorsi-flexion: 5/5 Eversion: 5/5 Inversion: 5/5  Leg strength  Quad: 5/5 Hamstring: 5/5 Hip flexor: 5/5 Hip abductors: 5/5  Gait unremarkable. Severely tight hip flexors bilaterally right greater than left Hip: Right ROM IR:  35 Deg, ER: 45 Deg, Flexion: 120 Deg, Extension: 100 Deg, Abduction: 45 Deg, Adduction: 45 Deg Strength IR: 5/5, ER: 5/5, Flexion: 5/5, Extension: 5/5, Abduction: 5/5, Adduction: 5/5 Pelvic alignment see OMT finding   Greater trochanter tender to palpation Positive Pearlean Brownie been negative FADIR. No SI joint tenderness and normal minimal SI movement. Contralateral side unremarkable OMT Physical Exam  Standing structural       Occiput  Shoulder right higher  Inferior angle of scapula right higher  Illiac crest right posterior rotated  Medial malleolus neutral  Standing flexion right  Seated Flexion right  Cervical  Neutral Thoracic T5 extended rotated and side bent left T9 extended rotated and side bent right Lumbar L2 flexed rotated and side bent left Sacrum Left on left Illium Right posterior ilium    Impression and Recommendations:     This case required medical decision making of moderate complexity.

## 2013-04-11 NOTE — Progress Notes (Signed)
Subjective:    Patient ID: Jessica [name], female    DOB: 20-Mar-1966, 47 y.o.   MRN: [id number]  HPI  patient is here today for annual physical. Patient feels well and has no complaints.  Also reviewed chronic medical issues and interval medical events:  Depression/anxiety - long hx same, but exacerbated summer - associated with tearfulness, nervousness and sleep problems (chronic poor sleep) - Also associated with irritability of mood, +hx same - tx with therapy and meds during last episode age 81s (20y ago); symptoms precipitated by +social stressors (loss of spouse's job, Surveyor, quantity) briefly resume counseling fall 2011 - not needed at this time ("past the crisis")-  no SI/HI- started bupropion for same 04/2010 and alpraz as needed -  feels improved, symptoms tolerable at this time though stressors unchanged  cystic acne - initially improved on Yaz - follows with gyn for same long hx same- prev on retinA; also uses cream or wash - refills needed  dyslipidemia - rx'd statin 11/3011, but infreq use of same - denies adverse side effects but prefers natural meds and hope for weight loss  Past Medical History  Diagnosis Date  . DYSPLASTIC NEVUS, CHEST   . THROMBOCYTOSIS   . CYSTIC ACNE   . INSOMNIA   . ELEVATED BP READING WITHOUT DX HYPERTENSION   . DEPRESSION   . ALLERGIC RHINITIS CAUSE UNSPECIFIED   . ANXIETY DISORDER   . Mixed hyperlipidemia    Family History  Problem Relation Age of Onset  . Hyperlipidemia Father   . Hypertension Father   . Depression Sister   . Coronary artery disease Paternal Grandfather    History  Substance Use Topics  . Smoking status: Never Smoker   . Smokeless tobacco: Never Used     Comment: Married lives with spouse and two kids. She is originally from [place]-moved to [facility] 2004  . Alcohol Use: No     Review of Systems  Constitutional: Negative for fever and unexpected weight change.  Respiratory: Negative for shortness of breath and  wheezing.   Cardiovascular: Negative for chest pain.  Neurological: Negative for headaches.  Gastrointestinal: Negative for abdominal pain, no bowel changes.  Musculoskeletal: Positive for gait problem due to R lateral hip pain - s/p treatment of collapsing arch on R foot -?follow up with sports med Skin: Negative for rash.  Neurological: Negative for dizziness or headache.  No other specific complaints in a complete review of systems (except as listed in HPI above).      Objective:   Physical Exam BP 120/82  Pulse 75  Temp(Src) 98 F (36.7 C) (Oral)  Ht 5\' 3"  (1.6 m)  Wt 173 lb (78.472 kg)  BMI 30.65 kg/m2  SpO2 97% Wt Readings from Last 3 Encounters:  04/11/13 173 lb (78.472 kg)  05/09/12 171 lb (77.565 kg)  04/14/12 171 lb 1.9 oz (77.62 kg)   Constitutional: She is overweight, but appears well-developed and well-nourished. No distress.  HENT: Head: Normocephalic and atraumatic. Ears: B TMs ok, no erythema or effusion; Nose: Nose normal. Mouth/Throat: Oropharynx is clear and moist. No oropharyngeal exudate.  Eyes: Conjunctivae and EOM are normal. Pupils are equal, round, and reactive to light. No scleral icterus.  Neck: Normal range of motion. Neck supple. No JVD present. No thyromegaly present.  Cardiovascular: Normal rate, regular rhythm and normal heart sounds.  No murmur heard. No BLE edema. Pulmonary/Chest: Effort normal and breath sounds normal. No respiratory distress. She has no wheezes.  Abdominal: Soft. Bowel  sounds are normal. She exhibits no distension. There is no tenderness. no masses Musculoskeletal: Back: full range of motion of thoracic and lumbar spine. Non tender to palpation. Negative straight leg raise. DTR's are symmetrically intact. Sensation intact in all dermatomes of the lower extremities. Full strength to manual muscle testing. patient is able to heel toe walk without difficulty and ambulates with antalgic gait. R hip -Normal range of motion, no joint  effusions. No gross deformities - min tender over greater troch bursa Neurological: She is alert and oriented to person, place, and time. No cranial nerve deficit. Coordination normal.  Skin: Skin is warm and dry. No rash noted. No erythema.  Psychiatric: She has a mildly anxious, dysphoric mood and affect. Her behavior is normal. Judgment and thought content normal.   Lab Results  Component Value Date   WBC 6.5 11/12/2011   HGB 12.8 11/12/2011   HCT 37.7 11/12/2011   PLT 398.0 11/12/2011   CHOL 276* 11/12/2011   TRIG 187.0* 11/12/2011   HDL 82.10 11/12/2011   LDLDIRECT 162.3 11/12/2011   ALT 22 11/12/2011   AST 16 11/12/2011   NA 136 11/12/2011   K 4.1 11/12/2011   CL 103 11/12/2011   CREATININE 0.7 11/12/2011   BUN 14 11/12/2011   CO2 27 11/12/2011   TSH 2.53 11/12/2011        Assessment & Plan:  CPX/v70.0 - Patient has been counseled on age-appropriate routine health concerns for screening and prevention. These are reviewed and up-to-date. Immunizations are up-to-date or declined. Labs pending, to be reviewed.  Also see problem list. Medications and labs reviewed today.

## 2013-04-11 NOTE — Assessment & Plan Note (Signed)
Patient did have significant tightness of the hip flexors bilaterally. Patient given exercises and stretching that could be beneficial. Told her to encourage working on core strengthening as well which will help. Hip abductor strengthening will help decrease the amount of use of the hip flexors in general. Will return in 4 weeks for further evaluation

## 2013-04-11 NOTE — Assessment & Plan Note (Signed)
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  Given rehab program  - primarily ITB stretching program, core stability program, gluteus medius strengthening, hip abductor strengthening, and core. Additional proprioception and mild plyometrics program reviewed.  Reviewed plan of care and rehab is the primary treatment in this condition. Discussed at followup in 4 weeks if still having pain would consider injection in the greater trochanteric bursitis.

## 2013-04-11 NOTE — Assessment & Plan Note (Signed)
Decision today to treat with OMT was based on Physical Exam  After verbal consent patient was treated with HVLA, ME techniques in thoracic, lumbar, sacral and pelvic areas  Patient tolerated the procedure well with improvement in symptoms  Patient given exercises, stretches and lifestyle modifications  See medications in patient instructions if given  Patient will follow up in 3-6 weeks 

## 2013-04-11 NOTE — Patient Instructions (Addendum)
It was good to see you today. Health Maintenance reviewed - flu shot given today - all other recommended screening and immunizations are up to date.  Test(s) ordered today. Your results will be called to you after review (48-72hours after test completion). If any changes need to be made, you will be notified at that time. Continue to work on lifestyle changes as discussed (low fat, low carb, increased protein diet; improved exercise efforts; weight loss) to control sugar, blood pressure and cholesterol levels and/or reduce risk of developing other medical problems. Look into [email or web address] or other type of food journal to assist you in this process. Medications reviewed and updated, try Zanflex as needed for muscle relaxer -no other changes at this time. Refill on medication(s) as discussed today. we'll make referral for sports med for hip/back and gait issues. Our office will contact you regarding appointment(s) once made. Please schedule followup in 1 year for physical and labs, call sooner if problems.  Health Maintenance, Females A healthy lifestyle and preventative care can promote health and wellness.  Maintain regular health, dental, and eye exams.  Eat a healthy diet. Foods like vegetables, fruits, whole grains, low-fat dairy products, and lean protein foods contain the nutrients you need without too many calories. Decrease your intake of foods high in solid fats, added sugars, and salt. Get information about a proper diet from your caregiver, if necessary.  Regular physical exercise is one of the most important things you can do for your health. Most adults should get at least 150 minutes of moderate-intensity exercise (any activity that increases your heart rate and causes you to sweat) each week. In addition, most adults need muscle-strengthening exercises on 2 or more days a week.   Maintain a healthy weight. The body mass index (BMI) is a screening tool to identify possible weight  problems. It provides an estimate of body fat based on height and weight. Your caregiver can help determine your BMI, and can help you achieve or maintain a healthy weight. For adults 20 years and older:  A BMI below 18.5 is considered underweight.  A BMI of 18.5 to 24.9 is normal.  A BMI of 25 to 29.9 is considered overweight.  A BMI of 30 and above is considered obese.  Maintain normal blood lipids and cholesterol by exercising and minimizing your intake of saturated fat. Eat a balanced diet with plenty of fruits and vegetables. Blood tests for lipids and cholesterol should begin at age 36 and be repeated every 5 years. If your lipid or cholesterol levels are high, you are over 50, or you are a high risk for heart disease, you may need your cholesterol levels checked more frequently.Ongoing high lipid and cholesterol levels should be treated with medicines if diet and exercise are not effective.  If you smoke, find out from your caregiver how to quit. If you do not use tobacco, do not start.  If you are pregnant, do not drink alcohol. If you are breastfeeding, be very cautious about drinking alcohol. If you are not pregnant and choose to drink alcohol, do not exceed 1 drink per day. One drink is considered to be 12 ounces (355 mL) of beer, 5 ounces (148 mL) of wine, or 1.5 ounces (44 mL) of liquor.  Avoid use of street drugs. Do not share needles with anyone. Ask for help if you need support or instructions about stopping the use of drugs.  High blood pressure causes heart disease and increases the risk of stroke.  Blood pressure should be checked at least every 1 to 2 years. Ongoing high blood pressure should be treated with medicines, if weight loss and exercise are not effective.  If you are 63 to 45 years old, ask your caregiver if you should take aspirin to prevent strokes.  Diabetes screening involves taking a blood sample to check your fasting blood sugar level. This should be done  once every 3 years, after age 88, if you are within normal weight and without risk factors for diabetes. Testing should be considered at a younger age or be carried out more frequently if you are overweight and have at least 1 risk factor for diabetes.  Breast cancer screening is essential preventative care for women. You should practice "breast self-awareness." This means understanding the normal appearance and feel of your breasts and may include breast self-examination. Any changes detected, no matter how small, should be reported to a caregiver. Women in their 76s and 30s should have a clinical breast exam (CBE) by a caregiver as part of a regular health exam every 1 to 3 years. After age 5, women should have a CBE every year. Starting at age 64, women should consider having a mammogram (breast X-ray) every year. Women who have a family history of breast cancer should talk to their caregiver about genetic screening. Women at a high risk of breast cancer should talk to their caregiver about having an MRI and a mammogram every year.  The Pap test is a screening test for cervical cancer. Women should have a Pap test starting at age 48. Between ages 44 and 34, Pap tests should be repeated every 2 years. Beginning at age 51, you should have a Pap test every 3 years as long as the past 3 Pap tests have been normal. If you had a hysterectomy for a problem that was not cancer or a condition that could lead to cancer, then you no longer need Pap tests. If you are between ages 40 and 43, and you have had normal Pap tests going back 10 years, you no longer need Pap tests. If you have had past treatment for cervical cancer or a condition that could lead to cancer, you need Pap tests and screening for cancer for at least 20 years after your treatment. If Pap tests have been discontinued, risk factors (such as a new sexual partner) need to be reassessed to determine if screening should be resumed. Some women have medical  problems that increase the chance of getting cervical cancer. In these cases, your caregiver may recommend more frequent screening and Pap tests.  The human papillomavirus (HPV) test is an additional test that may be used for cervical cancer screening. The HPV test looks for the virus that can cause the cell changes on the cervix. The cells collected during the Pap test can be tested for HPV. The HPV test could be used to screen women aged 68 years and older, and should be used in women of any age who have unclear Pap test results. After the age of 27, women should have HPV testing at the same frequency as a Pap test.  Colorectal cancer can be detected and often prevented. Most routine colorectal cancer screening begins at the age of 18 and continues through age 72. However, your caregiver may recommend screening at an earlier age if you have risk factors for colon cancer. On a yearly basis, your caregiver may provide home test kits to check for hidden blood in the stool.  Use of a small camera at the end of a tube, to directly examine the colon (sigmoidoscopy or colonoscopy), can detect the earliest forms of colorectal cancer. Talk to your caregiver about this at age 53, when routine screening begins. Direct examination of the colon should be repeated every 5 to 10 years through age 27, unless early forms of pre-cancerous polyps or small growths are found.  Hepatitis C blood testing is recommended for all people born from [birth year] through 1963 and any individual with known risks for hepatitis C.  Practice safe sex. Use condoms and avoid high-risk sexual practices to reduce the spread of sexually transmitted infections (STIs). Sexually active women aged 75 and younger should be checked for Chlamydia, which is a common sexually transmitted infection. Older women with new or multiple partners should also be tested for Chlamydia. Testing for other STIs is recommended if you are sexually active and at increased  risk.  Osteoporosis is a disease in which the bones lose minerals and strength with aging. This can result in serious bone fractures. The risk of osteoporosis can be identified using a bone density scan. Women ages 67 and over and women at risk for fractures or osteoporosis should discuss screening with their caregivers. Ask your caregiver whether you should be taking a calcium supplement or vitamin D to reduce the rate of osteoporosis.  Menopause can be associated with physical symptoms and risks. Hormone replacement therapy is available to decrease symptoms and risks. You should talk to your caregiver about whether hormone replacement therapy is right for you.  Use sunscreen with a sun protection factor (SPF) of 30 or greater. Apply sunscreen liberally and repeatedly throughout the day. You should seek shade when your shadow is shorter than you. Protect yourself by wearing long sleeves, pants, a wide-brimmed hat, and sunglasses year round, whenever you are outdoors.  Notify your caregiver of new moles or changes in moles, especially if there is a change in shape or color. Also notify your caregiver if a mole is larger than the size of a pencil eraser.  Stay current with your immunizations. Document Released: 02/08/2011 Document Revised: 10/18/2011 Document Reviewed: 02/08/2011 [facility] Patient Information 2014 [place], [place]. Back Exercises Back exercises help treat and prevent back injuries. The goal is to increase your strength in your belly (abdominal) and back muscles. These exercises can also help with flexibility. Start these exercises when told by your doctor. HOME CARE Back exercises include: Pelvic Tilt.  Lie on your back with your knees bent. Tilt your pelvis until the lower part of your back is against the floor. Hold this position 5 to 10 sec. Repeat this exercise 5 to 10 times. Knee to Chest.  Pull 1 knee up against your chest and hold for 20 to 30 seconds. Repeat this with the  other knee. This may be done with the other leg straight or bent, whichever feels better. Then, pull both knees up against your chest. Sit-Ups or Curl-Ups.  Bend your knees 90 degrees. Start with tilting your pelvis, and do a partial, slow sit-up. Only lift your upper half 30 to 45 degrees off the floor. Take at least 2 to 3 seonds for each sit-up. Do not do sit-ups with your knees out straight. If partial sit-ups are difficult, simply do the above but with only tightening your belly (abdominal) muscles and holding it as told. Hip-Lift.  Lie on your back with your knees flexed 90 degrees. Push down with your feet and shoulders as you raise  your hips 2 inches off the floor. Hold for 10 seconds, repeat 5 to 10 times. Back Arches.  Lie on your stomach. Prop yourself up on bent elbows. Slowly press on your hands, causing an arch in your low back. Repeat 3 to 5 times. Shoulder-Lifts.  Lie face down with arms beside your body. Keep hips and belly pressed to floor as you slowly lift your head and shoulders off the floor. Do not overdo your exercises. Be careful in the beginning. Exercises may cause you some mild back discomfort. If the pain lasts for more than 15 minutes, stop the exercises until you see your doctor. Improvement with exercise for back problems is slow.  Document Released: 08/28/2010 Document Revised: 10/18/2011 Document Reviewed: 05/27/2011 [facility] Patient Information 2014 [place], [place].

## 2013-04-11 NOTE — Assessment & Plan Note (Signed)
on current meds (buproprion) since fall 2011 - but currently symptoms not well controlled Back in counseling and planning to see pscy NP Jessica [name] soon for med adjustments No SI/HI - support offered today

## 2013-04-11 NOTE — Assessment & Plan Note (Signed)
Decision today to treat with OMT was based on Physical Exam  After verbal consent patient was treated with HVLA, ME techniques in thoracic, lumbar, sacral and pelvic areas  Patient tolerated the procedure well with improvement in symptoms  Patient given exercises, stretches and lifestyle modifications  See medications in patient instructions if given  Patient will follow up in 3-6 weeks

## 2013-04-11 NOTE — Assessment & Plan Note (Signed)
On statin since spring 2013 Check now, adjust as needed Continue work on diet and exercise 

## 2013-04-11 NOTE — Assessment & Plan Note (Signed)
Chronic issues -  currently working with chiropractor for same/alt med/osteopathic manipulator Change flexeril to alt muscle relaxer and refer back to sports med for ?osteopathic manipulation

## 2013-04-17 ENCOUNTER — Telehealth: Payer: Self-pay | Admitting: *Deleted

## 2013-04-17 NOTE — Telephone Encounter (Signed)
Pt left msg stating that she has been doing the exercises that were given to her last week at her OV but she is now having worst pain in lower back, left side. Pt states the pain is severe & she is having difficulty doing most things. Please advise.

## 2013-04-17 NOTE — Telephone Encounter (Signed)
Called patient back at this time. She is having some exacerbation of low back pain on the left side. Patient is to stop exercises for the next 48 hours and then increase by 50% for the next week. Patient is to followup with [place] in 2 weeks. Patient will be calling for an appointment

## 2013-04-19 ENCOUNTER — Ambulatory Visit (INDEPENDENT_AMBULATORY_CARE_PROVIDER_SITE_OTHER): Payer: BC Managed Care – PPO | Admitting: Family Medicine

## 2013-04-19 ENCOUNTER — Encounter: Payer: Self-pay | Admitting: Family Medicine

## 2013-04-19 VITALS — BP 108/80 | HR 76 | Wt 170.0 lb

## 2013-04-19 DIAGNOSIS — M533 Sacrococcygeal disorders, not elsewhere classified: Secondary | ICD-10-CM | POA: Insufficient documentation

## 2013-04-19 MED ORDER — DIAZEPAM 5 MG PO TABS: 5.0000 mg | ORAL_TABLET | Freq: Two times a day (BID) | ORAL | Status: DC | PRN

## 2013-04-19 NOTE — Progress Notes (Signed)
  Subjective:    CC: Back pain and hip pain  HPI: Patient is a 47 year old female who I did see a little over one week ago coming in with worsening back pain. Patient states over the course of the last week it does seem to localize on the left side. This is somewhat different than it was previously. Patient states it is worse with bending over and other type activities such as walking. Patient describes the pain is 9/10 on the severity scale. Patient states it is waking her up at night. Patient denies any radiation down the leg, any numbness, any tingling or weakness of the lower extremity. Patient can point to one spot that seems to hurt more than the others. Patient also states it's worse with rotation. Patient has tried icing and stop the exercises which has made minimal improvement. Patient denies any fevers or chills or any dysuria.  Past medical history, Surgical history, Family history not pertinant except as noted below, Social history, Allergies, and medications have been entered into the medical record, reviewed, and no changes needed.   Review of Systems: No fevers, chills, night sweats, weight loss, chest pain, or shortness of breath.   Objective:   Blood pressure 108/80, pulse 76, weight 170 lb (77.111 kg), SpO2 97.00%.  General: Well Developed, well nourished, and in no acute distress.  Neuro: Alert and oriented x3, extra-ocular muscles intact, sensation grossly intact.  HEENT: Normocephalic, atraumatic, pupils equal round reactive to light, neck supple, no masses, no lymphadenopathy, thyroid nonpalpable.  Skin: Warm and dry, no rashes. Cardiac:  no lower extremity edema. Respiratory: Not using accessory muscles, speaking in full sentences. Abdominal: NT, soft Gait: Mild antalgic gait Lymphatic: no lymphadenopathy in neck or axillae on palpation, non tender.  Musculoskeletal: Inspection and palpation of the right and left upper extremities including the shoulders elbows and wrist  are unremarkable with full range of motion and good muscle strength and tone. Inspection and palpation of the right and left lower extremities including the hips knees and ankles are unremarkable and nontender with full range of motion and good muscle strength and tone and are symmetric. Back Exam:  Inspection: Unremarkable  Motion: Flexion 45 deg, Extension 45 deg, Side Bending to 45 deg bilaterally,  Rotation to 45 deg bilaterally  SLR laying: Negative  XSLR laying: Negative  Palpable tenderness: None. FABER: Positive on left Sensory change: Gross sensation intact to all lumbar and sacral dermatomes.  Reflexes: 2+ at both patellar tendons, 2+ at achilles tendons, Babinski's downgoing.  Strength at foot  Plantar-flexion: 5/5 Dorsi-flexion: 5/5 Eversion: 5/5 Inversion: 5/5  Leg strength  Quad: 5/5 Hamstring: 5/5 Hip flexor: 5/5 Hip abductors: 5/5  Severely tender to palpation over the left SI joint  After verbal and written consent patient was prepped with 2 alcohol swabs and did have a 22-gauge 1-1/2 inch needle injected into the left SI joint. Ultrasound was used for needle placement. Patient did have 2 cc of 0.5% Marcaine as well as 1 cc of Kenalog 40 injected into the joint and surrounding area. Capsule was distended on an ultrasound. Patient tolerated the procedure well and within 5 minutes had significant decrease in pain. Band-Aid placed and postinjection instructions discussed.  Impression and Recommendations:

## 2013-04-19 NOTE — Assessment & Plan Note (Signed)
Patient did have injection today and tolerated the procedure very well. We will back off on some of the exercises and start again in 48 hours. Patient is to avoid any significant rotational component over the course of the next 5 days and to steroid is doing very well. Patient was given a prescription of Valium for muscle relaxer. Discuss not mixing it with her Xanax. Told her that this is just a temporary medication and will not be used long-term. I do think patient does have underlying anxiety that seems to be making this much more difficult. We'll see patient back again in 2 weeks. Hopefully she is tolerating everything much better and we'll consider starting manipulation therapy again.

## 2013-04-19 NOTE — Patient Instructions (Addendum)
I gave you an injection in your SI joint.  It will be great for 6 hours then painful for 6 hours. The you will love me again.  No exercises for next 24 hours. Then do half the exercises daily.  Ice 20 minutes 3-4 times daily.  Naproxen 2 times daily for next 3 days no matter what. Then as needed.  Come back and see me again in 2 weeks.    Sacroiliac Joint Mobilization and Rehab 1. Work on pretzel stretching, shoulder back and leg draped in front. 3-5 sets, 30 sec.. 2. hip abductor rotations. standing, hip flexion and rotation outward then inward. 3 sets, 15 reps. when can do comfortably, add ankle weights starting at 2 pounds.  3. cross over stretching - shoulder back to ground, same side leg crossover. 3-5 sets for 30 min..  4. rolling up and back knees to chest and rocking. 5. sacral tilt - 5 sets, hold for 5-10 seconds

## 2013-04-23 ENCOUNTER — Ambulatory Visit (INDEPENDENT_AMBULATORY_CARE_PROVIDER_SITE_OTHER)
Admission: RE | Admit: 2013-04-23 | Discharge: 2013-04-23 | Disposition: A | Discharge status: Home / Self Care | Payer: BC Managed Care – PPO | Source: Ambulatory Visit | Attending: Family Medicine | Admitting: Family Medicine

## 2013-04-23 ENCOUNTER — Telehealth: Payer: Self-pay | Admitting: *Deleted

## 2013-04-23 ENCOUNTER — Ambulatory Visit (INDEPENDENT_AMBULATORY_CARE_PROVIDER_SITE_OTHER): Payer: BC Managed Care – PPO | Admitting: Family Medicine

## 2013-04-23 ENCOUNTER — Encounter: Payer: Self-pay | Admitting: Family Medicine

## 2013-04-23 VITALS — BP 128/82 | HR 92 | Wt 164.0 lb

## 2013-04-23 DIAGNOSIS — M549 Dorsalgia, unspecified: Secondary | ICD-10-CM

## 2013-04-23 DIAGNOSIS — M545 Low back pain: Secondary | ICD-10-CM

## 2013-04-23 DIAGNOSIS — G8929 Other chronic pain: Secondary | ICD-10-CM

## 2013-04-23 MED ORDER — METHYLPREDNISOLONE ACETATE 80 MG/ML IJ SUSP
80.0000 mg | Freq: Once | INTRAMUSCULAR | Status: AC
  Administered 2013-04-23: 80 mg via INTRAMUSCULAR

## 2013-04-23 MED ORDER — KETOROLAC TROMETHAMINE 60 MG/2ML IM SOLN
60.0000 mg | Freq: Once | INTRAMUSCULAR | Status: AC
  Administered 2013-04-23: 60 mg via INTRAMUSCULAR

## 2013-04-23 MED ORDER — DIAZEPAM 5 MG PO TABS: 5.0000 mg | ORAL_TABLET | Freq: Two times a day (BID) | ORAL | Status: DC | PRN

## 2013-04-23 MED ORDER — PREDNISONE 50 MG PO TABS: 50.0000 mg | ORAL_TABLET | Freq: Every day | ORAL | Status: DC

## 2013-04-23 NOTE — Patient Instructions (Addendum)
I am sorry you are still hurting Try the prednisone. Take daily for 5 days Two shots today to help Stop aleve for now.  Do physical therapy.  They will call you I will get xrays and  Call you with the results.  Come back in 2 weeks.

## 2013-04-23 NOTE — Assessment & Plan Note (Signed)
Patient is having worsening chronic low back pain at this time. Patient did respond somewhat to a left SI joint injection but not significantly. Differential is likely more commonly a muscle spasm And poor core stability There is a potential though for herniated disc. X-rays were ordered today to rule out bony abnormality Patient was referred to physical therapy Toradol and Depo-Medrol given today Patient will do 5 date burst of prednisone Patient will followup in 2 weeks. Patient can call if worsening radicular symptoms occur and we'll consider MRI

## 2013-04-23 NOTE — Telephone Encounter (Signed)
[facility] Triage Call Report Triage Record Num: [id number] Operator: Valene Bors Patient Name: Jessica [name] Call Date & Time: 04/22/2013 2:17:30PM Patient Phone: [phone] PCP: Terrilee Files Patient Gender: Female PCP Fax : [phone] Patient DOB: 1966-04-07 Practice Name: [facility] Reason for Call: LMP- takes BCP doesnt have them. Caller: [name]/Patient; PCP: Terrilee Files; CB#: [phone]; Call regarding seen in the office for mild Back Pain 2x in past 10 days; Last seen in the office on 04/19/13 and given Cortizone shot and today pain alot worse-04/22/13. Hurts in L lower back and pain goes down inner thigh and outer knee. Xray done a few months ago and dx with spinal misalignment. No disc issues. She has tried Ice packs 4-5 x daily, hot baths, taking Naproxin and Valium and Tylenol and not helping. Triage and Care advice per Back Symptoms Protocol and advised to be checked within 24 hours for " Back pain radiates into thigh or below knee". Appointment scheduled for 04/23/13 at 08:00 with Dr. Katrinka Blazing. Protocol(s) Used: Back Symptoms Recommended Outcome per Protocol: See Provider within 24 hours Reason for Outcome: Back pain radiates into thigh or below knee Care Advice: ~ Call provider if symptoms worsen or new symptoms develop. ~ Avoid heavy lifting, bending and twisting of the back, and prolonged sitting until evaluated by provider. Apply a cloth-covered cold or ice pack to the area for 20 minutes 4 to 8 times a day for relief of pain for the first 24-48 hours. After 24 to 48 hours of cold application, use a cloth-covered heat pack to the area for 20 minutes 3 to 4 times a day. ~ Sleep on a moderately firm mattress. Try sleeping on back with a pillow placed under knees or sleep on side with knees bent and a pillow between knees. When lying on stomach, place pillow under the abdomen and pelvis; do not use a pillow for your head. ~ Go to the ED if you have worsening  pain, numbness or weakness of arms or legs, cannot walk, or have new unexplained changes in bladder or bowel function. Another adult should drive. ~ ~

## 2013-04-23 NOTE — Progress Notes (Signed)
  Subjective:    CC: Worsening back pain  HPI: Patient unfortunately was only seen 4 days ago for back pain. Patient did have injection into the left SI joint at last appointment. Patient did have good improvement immediately following. Patient states that this though only lasted several hours an and never seem to improve. 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 Patient states that this does give her some mild relief. Patient states unfortunately she is also having more intermittent pain it seems to be going down the lateral aspect of her leg. This seems to be intermittent. Patient denies any numbness or weakness of the lower extremity but states that it is more difficult to get comfortable. Patient states that the pain was much worse yesterday and today has improved somewhat. Patient does not know what seems to make it better other than ice and heat. Aleve has started to hurt her stomach somewhat. Denies any black stool, diarrhea but states that she is having some constipation.  Past medical history, Surgical history, Family history not pertinant except as noted below, Social history, Allergies, and medications have been entered into the medical record, reviewed, and no changes needed.   Review of Systems: No fevers, chills, night sweats, weight loss, chest pain, or shortness of breath.   Objective:   Blood pressure 128/82, pulse 92, weight 164 lb (74.39 kg), SpO2 98.00%.  General: Well Developed, well nourished, and in no acute distress.  Neuro: Alert and oriented x3, extra-ocular muscles intact, sensation grossly intact.  HEENT: Normocephalic, atraumatic, pupils equal round reactive to light, neck supple, no masses, no lymphadenopathy, thyroid nonpalpable.  Skin: Warm and dry, no rashes. Cardiac:  no lower extremity edema. Respiratory: Not using accessory muscles, speaking in full sentences. Abdominal: NT, soft Gait: Nonantlagic, good balance and  coordination Lymphatic: no lymphadenopathy in neck or axillae on palpation, non tender.  Musculoskeletal: Inspection and palpation of the right and left upper extremities including the shoulders elbows and wrist are unremarkable with full range of motion and good muscle strength and tone. Inspection and palpation of the right and left lower extremities including the hips knees and ankles are unremarkable and nontender with full range of motion and good muscle strength and tone and are symmetric. Back Exam:  Inspection: Unremarkable  Motion: Flexion 45 deg, Extension 45 deg, Side Bending to 45 deg bilaterally,  Rotation to 45 deg bilaterally  SLR laying: None equivocal but pain XSLR laying: Negative  Palpable tenderness: Lower lumbar bilaterally left greater than right FABER: Positive on left  Sensory change: Gross sensation intact to all lumbar and sacral dermatomes.  Reflexes: 2+ at both patellar tendons, 2+ at achilles tendons, Babinski's downgoing.  Strength at foot  Plantar-flexion: 5/5 Dorsi-flexion: 5/5 Eversion: 5/5 Inversion: 5/5  Leg strength  Quad: 5/5 Hamstring: 5/5 Hip flexor: 5/5 Hip abductors: 5/5  Severely tender to palpation over the left SI joint   Impression and Recommendations:

## 2013-04-24 ENCOUNTER — Telehealth: Payer: Self-pay | Admitting: *Deleted

## 2013-04-24 NOTE — Telephone Encounter (Signed)
Pt called stating that this morning she was experiencing diarrhea & is currently having an anxiety attack. Pt states that she has not yet started her prednisone & the only thing she has been taking is the valium. Pt is concerned that the valium is causing these side effects. She would also like to know when should she start taking the prednisone.

## 2013-04-25 NOTE — Telephone Encounter (Signed)
Please call and tell her to start the prednisone now.   Likely not valium causing the problem but she is able to stop that if she wants.

## 2013-04-25 NOTE — Telephone Encounter (Signed)
Left detailed msg on pt's vmail.  

## 2013-04-30 ENCOUNTER — Encounter: Payer: Self-pay | Admitting: Family Medicine

## 2013-04-30 ENCOUNTER — Ambulatory Visit (INDEPENDENT_AMBULATORY_CARE_PROVIDER_SITE_OTHER): Payer: BC Managed Care – PPO | Admitting: Family Medicine

## 2013-04-30 VITALS — BP 124/84 | HR 93 | Wt 165.0 lb

## 2013-04-30 DIAGNOSIS — M76899 Other specified enthesopathies of unspecified lower limb, excluding foot: Secondary | ICD-10-CM

## 2013-04-30 DIAGNOSIS — M7062 Trochanteric bursitis, left hip: Secondary | ICD-10-CM | POA: Insufficient documentation

## 2013-04-30 DIAGNOSIS — G8929 Other chronic pain: Secondary | ICD-10-CM

## 2013-04-30 DIAGNOSIS — M545 Low back pain, unspecified: Secondary | ICD-10-CM

## 2013-04-30 NOTE — Patient Instructions (Signed)
It is good to see you Message would be good. Avoid deep tissue. Good for meditation Hold on to the prednisone, and the valium Start physical therapy.  Walk 15 minutes daily and then stretch.  Increase by 5 minutes a week.  Call me after your appointment and tell me the medications.  Come back in 2-3 weeks. We can start manipulation at that time.

## 2013-04-30 NOTE — Assessment & Plan Note (Signed)
Spent greater than 45 minutes with patient face-to-face and had greater than 50% of counseling including as described above in assessment and plan. Discussed with patient at great length about this being a multifactorial problem. The discussed that likely this is more of psycho physical in nature. Patient does have a history of generalized anxiety disorder which I do not think is well controlled at this time. Patient is setting up an appointment with her psychiatrist tomorrow as well as a Probation officer on Wednesday. Patient will call me after going through this medication changes and we'll discuss what will be the best protocol. Patient did receive new exercises for me today which I think will be somewhat easier technique wise. Patient will go to formal physical therapy because I do think that this is beneficial and patient does have significant muscle imbalances. Patient then come back to see me in 2-3 weeks. Depending on what is going on we may try some more manipulation. Patient can do massage which we discussed but tried to avoid deep muscle at this time. Patient did not take the prednisone which I do think it was likely a good idea with patient's generalized anxiety disorder at this time. Patient will hold onto this and we will see how she is doing. If she continues to have pain we'll consider doing the prednisone.

## 2013-04-30 NOTE — Assessment & Plan Note (Signed)
Once again spends a significant amount of time going over different techniques, discussion about weight reduction, as well as core strengthening and can be beneficial. We will avoid any medications right now until patient has a better regimen with her generalized anxiety disorder. We will also declined any other intervention such as steroid injections until patient is feeling better. We will add on some stretches with her that she will do at formal physical therapy for this problem as well. Patient will come back in 2-3 weeks. She continues to have trouble we'll consider further evaluation and treatment if necessary. I do think that likely this is more of a compensatory mechanism for her low back.

## 2013-04-30 NOTE — Progress Notes (Signed)
  Subjective:    CC: Following up her back pain with radiculopathy  HPI: Patient is here to followup for her back pain with radiculopathy. 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 Patient was seen one week ago with a re\re exacerbation of the problem. Patient's was prescribed prednisone as well as Valium. Patient has not taken any of these medications because she is scared of the side effects. Patient does have a past medical history for generalized anxiety disorder and when asked states that she does feel that this has become somewhat out of control. Patient denies any suicidal or homicidal ideation. Patient has had some life changes that has been somewhat of a concern including her husband work and now her mother-in-law is in town. Patient states that this is a blessing and a curse. Regarding patient's back pain she states that it is near the same but possibly 20% better. Patient still is having some radiculopathy down the left leg but not as severe as it was previously. Patient states that she is having some increased soreness on the lateral aspect of her hip that can be tender to palpation. Patient has been doing some exercises but mostly only icing. Patient has been scared to start her regular regimen of activity.  Past medical history, Surgical history, Family history not pertinant except as noted below, Social history, Allergies, and medications have been entered into the medical record, reviewed, and no changes needed.   Review of Systems: No fevers, chills, night sweats, weight loss, chest pain, or shortness of breath.   Objective:   Blood pressure 124/84, pulse 93, weight 165 lb (74.844 kg), SpO2 96.00%.  General: Well Developed, well nourished, and in no acute distress.  Neuro: Alert and oriented x3, extra-ocular muscles intact, sensation grossly intact.  HEENT: Normocephalic, atraumatic, pupils equal round reactive to light, neck  supple, no masses, no lymphadenopathy, thyroid nonpalpable.  Skin: Warm and dry, no rashes. Cardiac:  no lower extremity edema. Respiratory: Not using accessory muscles, speaking in full sentences. Abdominal: NT, soft Gait: Nonantlagic, good balance and coordination Lymphatic: no lymphadenopathy in neck or axillae on palpation, non tender.  Musculoskeletal: Inspection and palpation of the right and left upper extremities including the shoulders elbows and wrist are unremarkable with full range of motion and good muscle strength and tone. Inspection and palpation of the right and left lower extremities including the hips knees and ankles are unremarkable and nontender with full range of motion and good muscle strength and tone and are symmetric. Back Exam:  Inspection: Unremarkable  Motion: Flexion 45 deg, Extension 45 deg, Side Bending to 45 deg bilaterally,  Rotation to 45 deg bilaterally  SLR laying: None equivocal but pain  XSLR laying: Negative  Palpable tenderness: Lower lumbar bilaterally left greater than right the patient also has pain over the greater trochanteric area on the left side to palpation FABER: Positive on left  Sensory change: Gross sensation intact to all lumbar and sacral dermatomes.  Reflexes: 2+ at both patellar tendons, 2+ at achilles tendons, Babinski's downgoing.  Strength at foot  Plantar-flexion: 5/5 Dorsi-flexion: 5/5 Eversion: 5/5 Inversion: 5/5  Leg strength  Quad: 5/5 Hamstring: 5/5 Hip flexor: 5/5 Hip abductors: 5/5    Impression and Recommendations:

## 2013-05-04 ENCOUNTER — Ambulatory Visit: Payer: BC Managed Care – PPO | Attending: Family Medicine

## 2013-05-04 ENCOUNTER — Telehealth: Payer: Self-pay | Admitting: *Deleted

## 2013-05-04 DIAGNOSIS — IMO0001 Reserved for inherently not codable concepts without codable children: Secondary | ICD-10-CM | POA: Insufficient documentation

## 2013-05-04 DIAGNOSIS — M549 Dorsalgia, unspecified: Secondary | ICD-10-CM | POA: Insufficient documentation

## 2013-05-04 DIAGNOSIS — G8929 Other chronic pain: Secondary | ICD-10-CM | POA: Insufficient documentation

## 2013-05-04 DIAGNOSIS — M25559 Pain in unspecified hip: Secondary | ICD-10-CM | POA: Insufficient documentation

## 2013-05-04 DIAGNOSIS — M2559 Pain in other specified joint: Secondary | ICD-10-CM | POA: Insufficient documentation

## 2013-05-04 NOTE — Telephone Encounter (Signed)
Pt called states Psychopharmacologist switched her from Wellbutrim to Lexapro and also from Xanax to Klonopin.  Pt further states she is going to hold off on the Prednisone due to anxiety.  Please advise

## 2013-05-06 NOTE — Telephone Encounter (Signed)
Please call patient and tell her that I agree with those changes and agree on holding off on the prednisone for now.

## 2013-05-07 ENCOUNTER — Other Ambulatory Visit: Payer: Self-pay | Admitting: *Deleted

## 2013-05-07 MED ORDER — SIMVASTATIN 20 MG PO TABS: 20.0000 mg | ORAL_TABLET | Freq: Every day | ORAL | Status: DC

## 2013-05-07 NOTE — Telephone Encounter (Signed)
Left detailed message on pts VM. 

## 2013-05-08 ENCOUNTER — Ambulatory Visit: Payer: BC Managed Care – PPO

## 2013-05-10 ENCOUNTER — Ambulatory Visit: Payer: BC Managed Care – PPO | Attending: Family Medicine

## 2013-05-10 DIAGNOSIS — G8929 Other chronic pain: Secondary | ICD-10-CM | POA: Insufficient documentation

## 2013-05-10 DIAGNOSIS — M2559 Pain in other specified joint: Secondary | ICD-10-CM | POA: Insufficient documentation

## 2013-05-10 DIAGNOSIS — IMO0001 Reserved for inherently not codable concepts without codable children: Secondary | ICD-10-CM | POA: Insufficient documentation

## 2013-05-10 DIAGNOSIS — M25559 Pain in unspecified hip: Secondary | ICD-10-CM | POA: Insufficient documentation

## 2013-05-10 DIAGNOSIS — M549 Dorsalgia, unspecified: Secondary | ICD-10-CM | POA: Insufficient documentation

## 2013-05-15 ENCOUNTER — Ambulatory Visit: Payer: BC Managed Care – PPO

## 2013-05-15 ENCOUNTER — Other Ambulatory Visit: Payer: Self-pay | Admitting: *Deleted

## 2013-05-15 MED ORDER — SIMVASTATIN 20 MG PO TABS: 20.0000 mg | ORAL_TABLET | Freq: Every day | ORAL | Status: DC

## 2013-05-17 ENCOUNTER — Ambulatory Visit: Payer: BC Managed Care – PPO

## 2013-05-18 ENCOUNTER — Encounter: Payer: Self-pay | Admitting: Family Medicine

## 2013-05-18 ENCOUNTER — Ambulatory Visit (INDEPENDENT_AMBULATORY_CARE_PROVIDER_SITE_OTHER): Payer: BC Managed Care – PPO | Admitting: Family Medicine

## 2013-05-18 VITALS — BP 122/80 | HR 109

## 2013-05-18 DIAGNOSIS — M545 Low back pain: Secondary | ICD-10-CM

## 2013-05-18 DIAGNOSIS — G8929 Other chronic pain: Secondary | ICD-10-CM

## 2013-05-18 NOTE — Patient Instructions (Signed)
Very good to see you and I am glad you are doing better Do not get the chair and enjoy Disney! Let scontinue and try the new physical therapy Continue exercises most days of the week at least Keep with walking Lets come back in 4 weeks.

## 2013-05-18 NOTE — Assessment & Plan Note (Signed)
Patient's chronic low back pain that is likely multifactorial it seems to be improving with new medications as well as physical therapy. Encourage patient to continue exercises on a regular basis. Patient can continue medications on an as-needed basis I have prescribed. Spent greater than 25 minutes with patient face-to-face and had greater than 50% of counseling including as described above in assessment and plan. Patient will follow up in 4 weeks for further evaluation.

## 2013-05-18 NOTE — Progress Notes (Signed)
  Subjective:    CC: Low back pain with radiculopathy  HPI: Patient is here following up for her low back pain with radiculopathy. 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 Patient was feeling that some of this might have been associated with her anxiety and did go to a medical therapist. Patient did have optimization of her medications none. Patient also did start formal physical therapy. Patient still has not taken the prednisone as prescribed due to her things worsen her anxiety. Patient states since starting physical therapy and changes medication she has been feeling significantly better. Patient states that physical therapy has helped and she think she is ready to go further with more exercises. In addition to this patient thinks to change her medication has helped tremendously. Patient is even looking forward to a trip to [place] in the near future. Patient would like to avoid any manipulation because she feels that this could potentially exacerbate the problem  Past medical history, Surgical history, Family history not pertinant except as noted below, Social history, Allergies, and medications have been entered into the medical record, reviewed, and no changes needed.   Review of Systems: No fevers, chills, night sweats, weight loss, chest pain, or shortness of breath.   Objective:   Blood pressure 122/80, pulse 109, SpO2 96.00%.  General: Well Developed, well nourished, and in no acute distress.  Neuro: Alert and oriented x3, extra-ocular muscles intact, sensation grossly intact.  HEENT: Normocephalic, atraumatic, pupils equal round reactive to light, neck supple, no masses, no lymphadenopathy, thyroid nonpalpable.  Skin: Warm and dry, no rashes.  Cardiac: no lower extremity edema.  Respiratory: Not using accessory muscles, speaking in full sentences.  Abdominal: NT, soft  Gait: Nonantlagic, good balance and coordination  Lymphatic: no lymphadenopathy in neck or  axillae on palpation, non tender.  Musculoskeletal: Inspection and palpation of the right and left upper extremities including the shoulders elbows and wrist are unremarkable with full range of motion and good muscle strength and tone.  Inspection and palpation of the right and left lower extremities including the hips knees and ankles are unremarkable and nontender with full range of motion and good muscle strength and tone and are symmetric.  Back Exam:  Inspection: Unremarkable  Motion: Flexion 45 deg, Extension 45 deg, Side Bending to 45 deg bilaterally,  Rotation to 45 deg bilaterally  SLR laying: None equivocal but pain  XSLR laying: Negative  Palpable tenderness: Minimal over the paraspinal musculature of the lumbar spine FABER: Positive on left but significantly less painful than previously. Sensory change: Gross sensation intact to all lumbar and sacral dermatomes.  Reflexes: 2+ at both patellar tendons, 2+ at achilles tendons, Babinski's downgoing.  Strength at foot  Plantar-flexion: 5/5 Dorsi-flexion: 5/5 Eversion: 5/5 Inversion: 5/5  Leg strength  Quad: 5/5 Hamstring: 5/5 Hip flexor: 5/5 Hip abductors: 5/5    Impression and Recommendations:

## 2013-05-23 ENCOUNTER — Ambulatory Visit: Payer: BC Managed Care – PPO

## 2013-10-29 ENCOUNTER — Telehealth: Payer: Self-pay

## 2013-10-29 DIAGNOSIS — F411 Generalized anxiety disorder: Secondary | ICD-10-CM

## 2013-10-29 NOTE — Telephone Encounter (Signed)
Patient called [facility] requesting to have her TSH checked. Thanks

## 2013-10-29 NOTE — Telephone Encounter (Signed)
ordered

## 2013-10-30 ENCOUNTER — Other Ambulatory Visit (INDEPENDENT_AMBULATORY_CARE_PROVIDER_SITE_OTHER): Payer: BC Managed Care – PPO

## 2013-10-30 DIAGNOSIS — F411 Generalized anxiety disorder: Secondary | ICD-10-CM

## 2013-10-30 LAB — TSH: TSH: 1.43 u[IU]/mL (ref 0.35–5.50)

## 2013-10-30 NOTE — Telephone Encounter (Signed)
[facility] advising of orders

## 2013-11-29 ENCOUNTER — Other Ambulatory Visit: Payer: Self-pay | Admitting: Internal Medicine

## 2013-12-24 ENCOUNTER — Encounter: Payer: Self-pay | Admitting: Family Medicine

## 2013-12-24 ENCOUNTER — Ambulatory Visit (INDEPENDENT_AMBULATORY_CARE_PROVIDER_SITE_OTHER): Payer: BC Managed Care – PPO | Admitting: Family Medicine

## 2013-12-24 VITALS — BP 130/84 | HR 101

## 2013-12-24 DIAGNOSIS — M7061 Trochanteric bursitis, right hip: Secondary | ICD-10-CM

## 2013-12-24 DIAGNOSIS — M76899 Other specified enthesopathies of unspecified lower limb, excluding foot: Secondary | ICD-10-CM

## 2013-12-24 DIAGNOSIS — M999 Biomechanical lesion, unspecified: Secondary | ICD-10-CM

## 2013-12-24 MED ORDER — PREDNISONE 50 MG PO TABS: 50.0000 mg | ORAL_TABLET | Freq: Every day | ORAL | Status: DC

## 2013-12-24 NOTE — Assessment & Plan Note (Signed)
Decision today to treat with OMT was based on Physical Exam  After verbal consent patient was treated with HVLA, ME techniques in thoracic, lumbar, sacral and pelvic areas  Patient tolerated the procedure well with improvement in symptoms  Patient given exercises, stretches and lifestyle modifications  See medications in patient instructions if given  Patient will follow up in 2 weeks

## 2013-12-24 NOTE — Assessment & Plan Note (Signed)
Patient is starting to have re\re exacerbation of this problem. Patient like to avoid an injection today and was given home exercise program as well as a icing protocol. Patient also given a trial of an anti-inflammatory to try 2 times a day and if she does like it we may be able to give patient a prescription. In addition to this patient was given prescription for prednisone encased patient is having worsening pain. Patient has had a history radicular symptoms and it is somewhat concerning for a herniated disc previously. I do not think that this is occurring this time once again would like to be prepared. Patient will follow up again in 2 weeks for further evaluation and treatment.  Spent greater than 25 minutes with patient face-to-face and had greater than 50% of counseling including as described above in assessment and plan.

## 2013-12-24 NOTE — Progress Notes (Signed)
  Subjective:    CC: Low back pain exacerbation/.   HPI: Patient is here for exacerbation of low back pain. She was doing remarkably well but has had some different changes recently. Patient has been put on a new anxiety medication as well as has a biopsy of her right hip for a more removal. Patient states at this time she has been more stimulating and starting to have increasing back pain again. Patient has been trying to do the exercises fairly regularly as well as trying some of the over-the-counter medications we discussed previously. Patient is concerned because she does not want to have it as painful as it was previously. Patient denies any significant radiation but seems to be more corresponding to the lateral aspect of the hip. Patient is still able to walk fairly regularly. Patient has gained weight she states which things is also contributing. Patient with the severity of 7/10.  Past medical history, Surgical history, Family history not pertinant except as noted below, Social history, Allergies, and medications have been entered into the medical record, reviewed, and no changes needed.   Review of Systems: No fevers, chills, night sweats, weight loss, chest pain, or shortness of breath.   Objective:   Blood pressure 130/84, pulse 101, SpO2 98.00%.  General: Well Developed, well nourished, and in no acute distress.  Neuro: Alert and oriented x3, extra-ocular muscles intact, sensation grossly intact.  HEENT: Normocephalic, atraumatic, pupils equal round reactive to light, neck supple, no masses, no lymphadenopathy, thyroid nonpalpable.  Skin: Warm and dry, no rashes.  Cardiac: no lower extremity edema.  Respiratory: Not using accessory muscles, speaking in full sentences.  Abdominal: NT, soft  Gait: Nonantlagic, good balance and coordination  Lymphatic: no lymphadenopathy in neck or axillae on palpation, non tender.  Musculoskeletal: Inspection and palpation of the right and left upper  extremities including the shoulders elbows and wrist are unremarkable with full range of motion and good muscle strength and tone.  Inspection and palpation of the right and left lower extremities including the hips knees and ankles are unremarkable and nontender with full range of motion and good muscle strength and tone and are symmetric.  Back Exam:  Inspection: Unremarkable  Motion: Flexion 45 deg, Extension 45 deg, Side Bending to 45 deg bilaterally,  Rotation to 45 deg bilaterally  SLR laying: None equivocal but pain  XSLR laying: Negative  Palpable tenderness: Minimal over the paraspinal musculature of the lumbar spine as well as over the greater trochanteric area. FABER: Positive on left but significantly less painful than previously. Sensory change: Gross sensation intact to all lumbar and sacral dermatomes.  Reflexes: 2+ at both patellar tendons, 2+ at achilles tendons, Babinski's downgoing.  Strength at foot  Plantar-flexion: 5/5 Dorsi-flexion: 5/5 Eversion: 5/5 Inversion: 5/5  Leg strength  Quad: 5/5 Hamstring: 5/5 Hip flexor: 5/5 Hip abductors: 5/5  OMT Physical Exam   Cervical  Neutral  Thoracic  T5 extended rotated and side bent left  T9 extended rotated and side bent right  Lumbar  L2 flexed rotated and side bent left  Sacrum  Left on left  Illium  Right posterior ilium   Impression and Recommendations:

## 2013-12-24 NOTE — Patient Instructions (Addendum)
Good to see you Try pennsaid twice daily for next 5 days and then as needed Ice 20 minutes 2 times a day Continue to wear the orthotics.  Prednisone daily for 5 days if no improvement in next weeks.  Exercsies 3 times a week.  Come back in 2 weeks.   Garret Reddish. [place] office

## 2013-12-24 NOTE — Assessment & Plan Note (Signed)
As stated above. 

## 2014-01-10 ENCOUNTER — Ambulatory Visit (INDEPENDENT_AMBULATORY_CARE_PROVIDER_SITE_OTHER): Payer: BC Managed Care – PPO | Admitting: Family Medicine

## 2014-01-10 ENCOUNTER — Encounter: Payer: Self-pay | Admitting: Family Medicine

## 2014-01-10 VITALS — BP 122/84 | HR 88 | Ht 63.0 in | Wt 194.0 lb

## 2014-01-10 DIAGNOSIS — M545 Low back pain, unspecified: Secondary | ICD-10-CM

## 2014-01-10 DIAGNOSIS — M533 Sacrococcygeal disorders, not elsewhere classified: Secondary | ICD-10-CM

## 2014-01-10 DIAGNOSIS — M999 Biomechanical lesion, unspecified: Secondary | ICD-10-CM

## 2014-01-10 DIAGNOSIS — G8929 Other chronic pain: Secondary | ICD-10-CM

## 2014-01-10 DIAGNOSIS — M9981 Other biomechanical lesions of cervical region: Secondary | ICD-10-CM

## 2014-01-10 NOTE — Assessment & Plan Note (Signed)
Patient's chronic low back pain has improved with her strength in her cor. Encourage patient to continue to wear good shoes and do the exercises regularly. Patient will continue with the over-the-counter medications. No other medications were given today. We discussed increasing activity as tolerated and a slow progression. Patient will come back again in 2 months when she returns to town after vacation.  Spent greater than 25 minutes with patient face-to-face and had greater than 50% of counseling including as described above in assessment and plan.

## 2014-01-10 NOTE — Assessment & Plan Note (Signed)
Decision today to treat with OMT was based on Physical Exam  After verbal consent patient was treated with HVLA, ME techniques in thoracic, lumbar, sacral and pelvic areas  Patient tolerated the procedure well with improvement in symptoms  Patient given exercises, stretches and lifestyle modifications  See medications in patient instructions if given  Patient will follow up in 8 weeks

## 2014-01-10 NOTE — Patient Instructions (Signed)
You are doing great!!! Continue working on that core, it is significantly better.  Continue the exercises that are helping you On your car ride if possible every 3 hours get out and stretch  Continue everything else you are doing Have a great summer See you in August!

## 2014-01-10 NOTE — Progress Notes (Signed)
  Subjective:    CC: Low back pain followup  HPI: Patient has had low back pain is significantly for quite some time. Patient has been doing home exercises and we've unfortunately had to do prednisone burst secondary to an exacerbation. Patient has had x-rays previously which were unremarkable. Patient has been doing the exercises but has been traveling significantly more but still states that she has made some improvement. Patient does respond very well to osteopathic manipulation. Patient denies any radiation of pain down the legs any numbness or tingling or any weakness. Patient denies any new symptoms.  Past medical history, Surgical history, Family history not pertinant except as noted below, Social history, Allergies, and medications have been entered into the medical record, reviewed, and no changes needed.   Review of Systems: No fevers, chills, night sweats, weight loss, chest pain, or shortness of breath.   Objective:   Blood pressure 122/84, pulse 88, height 5\' 3"  (1.6 m), weight 194 lb (87.998 kg), SpO2 95.00%.  General: Well Developed, well nourished, and in no acute distress.  Neuro: Alert and oriented x3, extra-ocular muscles intact, sensation grossly intact.  HEENT: Normocephalic, atraumatic, pupils equal round reactive to light, neck supple, no masses, no lymphadenopathy, thyroid nonpalpable.  Skin: Warm and dry, no rashes.  Cardiac: no lower extremity edema.  Respiratory: Not using accessory muscles, speaking in full sentences.  Abdominal: NT, soft  Gait: Nonantlagic, good balance and coordination  Lymphatic: no lymphadenopathy in neck or axillae on palpation, non tender.  Musculoskeletal: Inspection and palpation of the right and left upper extremities including the shoulders elbows and wrist are unremarkable with full range of motion and good muscle strength and tone.  Inspection and palpation of the right and left lower extremities including the hips knees and ankles are  unremarkable and nontender with full range of motion and good muscle strength and tone and are symmetric.  Back Exam:  Inspection: Unremarkable  Motion: Flexion 45 deg, Extension 45 deg, Side Bending to 45 deg bilaterally,  Rotation to 45 deg bilaterally  SLR laying: Negative XSLR laying: Negative  Palpable tenderness: Nontender exam FABER: Positive on left but significantly less painful than previously. Sensory change: Gross sensation intact to all lumbar and sacral dermatomes.  Reflexes: 2+ at both patellar tendons, 2+ at achilles tendons, Babinski's downgoing.  Strength at foot  Plantar-flexion: 5/5 Dorsi-flexion: 5/5 Eversion: 5/5 Inversion: 5/5  Leg strength  Quad: 5/5 Hamstring: 5/5 Hip flexor: 5/5 Hip abductors: 5/5  OMT Physical Exam   Cervical  C4 flexed rotated and side bent right Thoracic  T5 extended rotated and side bent left  T9 extended rotated and side bent right  Lumbar  L2 flexed rotated and side bent left  Sacrum  Left on left  Illium  Neutral   Impression and Recommendations:

## 2014-01-16 ENCOUNTER — Encounter: Payer: Self-pay | Admitting: Internal Medicine

## 2014-01-16 ENCOUNTER — Ambulatory Visit (INDEPENDENT_AMBULATORY_CARE_PROVIDER_SITE_OTHER): Payer: BC Managed Care – PPO | Admitting: Internal Medicine

## 2014-01-16 VITALS — BP 112/72 | HR 85 | Temp 98.3°F | Wt 193.4 lb

## 2014-01-16 DIAGNOSIS — K589 Irritable bowel syndrome without diarrhea: Secondary | ICD-10-CM

## 2014-01-16 DIAGNOSIS — R197 Diarrhea, unspecified: Secondary | ICD-10-CM

## 2014-01-16 DIAGNOSIS — F411 Generalized anxiety disorder: Secondary | ICD-10-CM

## 2014-01-16 NOTE — Assessment & Plan Note (Signed)
on current meds (buproprion) since fall 2011 - in counseling with pscy NP [facility] Luana Shu -reports symptoms improved despite ongoing social, financial and marital stress On lexapro since start of 2015, now with readdition of Wellbutrin with ongoing SSRI Verified no SI/HI - support offered today

## 2014-01-16 NOTE — Patient Instructions (Signed)
It was good to see you today.  We have reviewed your prior records including labs and tests today  Test(s) ordered today. return on Monday if needed. Your results will be released to [place] (or called to you) after review, usually within 72hours after test completion. If any changes need to be made, you will be notified at that same time.  Medications reviewed and updated, no changes recommended at this time. Ok for OTC immodium as needed   Please schedule followup in 3-4 months, call sooner if problems.

## 2014-01-16 NOTE — Progress Notes (Signed)
Subjective:    Patient ID: Jessica [name], female    DOB: Feb 16, 1966, 48 y.o.   MRN: [id number]  Diarrhea  This is a chronic problem. Episode onset: worse in past 67mo with increase stress. The problem occurs 2 to 4 times per day. The problem has been waxing and waning. The stool consistency is described as watery. The patient states that diarrhea does not awaken her from sleep. Associated symptoms include bloating and increased flatus. Pertinent negatives include no abdominal pain, myalgias, sweats, URI, vomiting or weight loss. The symptoms are aggravated by stress. There are no known risk factors. She has tried change of diet for the symptoms. The treatment provided mild relief. Her past medical history is significant for irritable bowel syndrome. There is no history of bowel resection, inflammatory bowel disease, malabsorption, a recent abdominal surgery or short gut syndrome.     Past Medical History  Diagnosis Date  . DYSPLASTIC NEVUS, CHEST   . THROMBOCYTOSIS   . CYSTIC ACNE   . INSOMNIA   . ELEVATED BP READING WITHOUT DX HYPERTENSION   . DEPRESSION   . ALLERGIC RHINITIS CAUSE UNSPECIFIED   . ANXIETY DISORDER   . Mixed hyperlipidemia     Review of Systems  Constitutional: Negative for weight loss.  Gastrointestinal: Positive for diarrhea, bloating and flatus. Negative for vomiting and abdominal pain.  Musculoskeletal: Negative for myalgias.       Objective:   Physical Exam  BP 112/72  Pulse 85  Temp(Src) 98.3 F (36.8 C) (Oral)  Wt 193 lb 6.4 oz (87.726 kg)  SpO2 96% Wt Readings from Last 3 Encounters:  01/16/14 193 lb 6.4 oz (87.726 kg)  01/10/14 194 lb (87.998 kg)  04/30/13 165 lb (74.844 kg)   Constitutional: She is obese, but appears well-developed and well-nourished. No distress.  Neck: Normal range of motion. Neck supple. No JVD present. No thyromegaly present.  Cardiovascular: Normal rate, regular rhythm and normal heart sounds.  No murmur heard. No  BLE edema. Abdomen: obese, soft, nontender, no rebound or guarding. Bowel sounds present throughout Pulmonary/Chest: Effort normal and breath sounds normal. No respiratory distress. She has no wheezes.  Psychiatric: She has a mildly dysphoric and anxious mood and affect. Her behavior is normal. Judgment and thought content normal.   Lab Results  Component Value Date   WBC 8.2 04/11/2013   HGB 13.5 04/11/2013   HCT 38.9 04/11/2013   PLT 467.0* 04/11/2013   GLUCOSE 90 04/11/2013   CHOL 218* 04/11/2013   TRIG 304.0* 04/11/2013   HDL 77.90 04/11/2013   LDLDIRECT 106.7 04/11/2013   ALT 18 04/11/2013   AST 18 04/11/2013   NA 134* 04/11/2013   K 4.6 04/11/2013   CL 100 04/11/2013   CREATININE 0.8 04/11/2013   BUN 11 04/11/2013   CO2 28 04/11/2013   TSH 1.43 10/30/2013    Dg Lumbar Spine Complete  04/23/2013   CLINICAL DATA:  Lower back pain. Pain in the knee. No known injury.  EXAM: LUMBAR SPINE - COMPLETE 4+ VIEW  COMPARISON:  None  FINDINGS: There is no evidence of lumbar spine fracture. Alignment is normal. Intervertebral disc spaces are maintained.  IMPRESSION: Negative.   Electronically Signed   By: Shon Hale M.D.   On: 04/23/2013 09:13       Assessment & Plan:   Diarrhea, intermittent greater than 3 months duration Reports watery, but no red flags on exam such as weight loss, blood, trouble or pain Unable to relate to  recent medication changes, but reviewed possibility of lexapro contributing to same Acknowledges association with increased stress causing increased symptoms ( financial trouble/marriage stress) Recommend food diary and use over-the-counter Imodium when needed Okay for probiotic as planned If symptoms unimproved over next 48 hours, patient will return for labs as needed, ordered same today  Problem List Items Addressed This Visit   ANXIETY DISORDER     on current meds (buproprion) since fall 2011 - in counseling with pscy NP [facility] Luana Shu -reports symptoms improved despite ongoing social,  financial and marital stress On lexapro since start of 2015, now with readdition of Wellbutrin with ongoing SSRI Verified no SI/HI - support offered today      Relevant Medications      buPROPion (WELLBUTRIN SR) 100 MG 12 hr tablet    Other Visit Diagnoses   Diarrhea    -  Primary    IBS (irritable bowel syndrome)

## 2014-01-16 NOTE — Progress Notes (Signed)
Pre visit review using our clinic review tool, if applicable. No additional management support is needed unless otherwise documented below in the visit note. 

## 2014-02-12 ENCOUNTER — Other Ambulatory Visit: Payer: Self-pay | Admitting: Internal Medicine

## 2014-03-28 ENCOUNTER — Other Ambulatory Visit: Payer: Self-pay

## 2014-03-28 DIAGNOSIS — Z1231 Encounter for screening mammogram for malignant neoplasm of breast: Secondary | ICD-10-CM

## 2014-04-01 LAB — HM PAP SMEAR

## 2014-04-12 ENCOUNTER — Ambulatory Visit: Payer: BC Managed Care – PPO

## 2014-04-12 ENCOUNTER — Encounter: Payer: BC Managed Care – PPO | Admitting: Internal Medicine

## 2014-05-01 ENCOUNTER — Ambulatory Visit (INDEPENDENT_AMBULATORY_CARE_PROVIDER_SITE_OTHER): Payer: BC Managed Care – PPO | Admitting: Internal Medicine

## 2014-05-01 ENCOUNTER — Encounter: Payer: Self-pay | Admitting: Internal Medicine

## 2014-05-01 ENCOUNTER — Other Ambulatory Visit (INDEPENDENT_AMBULATORY_CARE_PROVIDER_SITE_OTHER): Payer: BC Managed Care – PPO

## 2014-05-01 VITALS — BP 128/82 | HR 89 | Temp 98.5°F | Ht 63.0 in | Wt 190.5 lb

## 2014-05-01 DIAGNOSIS — E782 Mixed hyperlipidemia: Secondary | ICD-10-CM

## 2014-05-01 DIAGNOSIS — Z0000 Encounter for general adult medical examination without abnormal findings: Secondary | ICD-10-CM

## 2014-05-01 DIAGNOSIS — R197 Diarrhea, unspecified: Secondary | ICD-10-CM

## 2014-05-01 DIAGNOSIS — K589 Irritable bowel syndrome without diarrhea: Secondary | ICD-10-CM

## 2014-05-01 DIAGNOSIS — Z23 Encounter for immunization: Secondary | ICD-10-CM

## 2014-05-01 DIAGNOSIS — F411 Generalized anxiety disorder: Secondary | ICD-10-CM

## 2014-05-01 LAB — BASIC METABOLIC PANEL
BUN: 13 mg/dL (ref 6–23)
CHLORIDE: 103 meq/L (ref 96–112)
CO2: 26 mEq/L (ref 19–32)
Calcium: 9.4 mg/dL (ref 8.4–10.5)
Creatinine, Ser: 0.7 mg/dL (ref 0.4–1.2)
GFR: 89.8 mL/min (ref >60.00)
GLUCOSE: 89 mg/dL (ref 70–99)
POTASSIUM: 4.4 meq/L (ref 3.5–5.1)
Sodium: 135 mEq/L (ref 135–145)

## 2014-05-01 LAB — CBC WITH DIFFERENTIAL/PLATELET
Basophils Absolute: 0 10*3/uL (ref 0.0–0.1)
Basophils Relative: 0.4 % (ref 0.0–3.0)
Eosinophils Absolute: 0.2 10*3/uL (ref 0.0–0.7)
Eosinophils Relative: 1.7 % (ref 0.0–5.0)
HEMATOCRIT: 38.4 % (ref 36.0–46.0)
Hemoglobin: 12.9 g/dL (ref 12.0–15.0)
Lymphocytes Relative: 26.9 % (ref 12.0–46.0)
Lymphs Abs: 2.4 10*3/uL (ref 0.7–4.0)
MCHC: 33.6 g/dL (ref 30.0–36.0)
MCV: 84.1 fl (ref 78.0–100.0)
Monocytes Absolute: 0.8 10*3/uL (ref 0.1–1.0)
Monocytes Relative: 8.4 % (ref 3.0–12.0)
NEUTROS ABS: 5.6 10*3/uL (ref 1.4–7.7)
Neutrophils Relative %: 62.6 % (ref 43.0–77.0)
PLATELETS: 423 10*3/uL — AB (ref 150.0–400.0)
RBC: 4.56 Mil/uL (ref 3.87–5.11)
RDW: 12.4 % (ref 11.5–15.5)
WBC: 9 10*3/uL (ref 4.0–10.5)

## 2014-05-01 LAB — HEPATIC FUNCTION PANEL
ALBUMIN: 3.2 g/dL — AB (ref 3.5–5.2)
ALT: 18 U/L (ref 0–35)
AST: 17 U/L (ref 0–37)
Alkaline Phosphatase: 99 U/L (ref 39–117)
BILIRUBIN TOTAL: 0.4 mg/dL (ref 0.2–1.2)
Bilirubin, Direct: 0.1 mg/dL (ref 0.0–0.3)
Total Protein: 7.3 g/dL (ref 6.0–8.3)

## 2014-05-01 LAB — TSH: TSH: 3.3 u[IU]/mL (ref 0.35–4.50)

## 2014-05-01 MED ORDER — ADVANCED PROBIOTIC PO CAPS: 1.0000 | ORAL_CAPSULE | Freq: Every day | ORAL | Status: DC

## 2014-05-01 NOTE — Progress Notes (Signed)
Pre visit review using our clinic review tool, if applicable. No additional management support is needed unless otherwise documented below in the visit note. 

## 2014-05-01 NOTE — Assessment & Plan Note (Signed)
On statin since spring 2013 Check now, adjust as needed Continue work on diet and exercise

## 2014-05-01 NOTE — Progress Notes (Signed)
Subjective:    Patient ID: Jessica [name], female    DOB: 03-06-1966, 48 y.o.   MRN: [id number]  HPI  patient is here today for annual physical. Patient feels well and has no complaints.  Also reviewed chronic medical issues and interval medical events  Past Medical History  Diagnosis Date  . DYSPLASTIC NEVUS, CHEST   . THROMBOCYTOSIS   . CYSTIC ACNE   . INSOMNIA   . ELEVATED BP READING WITHOUT DX HYPERTENSION   . DEPRESSION   . ALLERGIC RHINITIS CAUSE UNSPECIFIED   . ANXIETY DISORDER   . Mixed hyperlipidemia    Family History  Problem Relation Age of Onset  . Hyperlipidemia Father   . Hypertension Father   . Depression Sister   . Coronary artery disease Paternal Grandfather    History  Substance Use Topics  . Smoking status: Never Smoker   . Smokeless tobacco: Never Used     Comment: Married lives with spouse and two kids. She is originally from [place] area-moved to [facility] 2004  . Alcohol Use: No    Review of Systems  Constitutional: Positive for unexpected weight change (gain). Negative for fatigue.  Respiratory: Negative for cough, shortness of breath and wheezing.   Cardiovascular: Negative for chest pain, palpitations and leg swelling.  Gastrointestinal: Positive for diarrhea (with food intake, esp fiber). Negative for nausea and abdominal pain.  Neurological: Negative for dizziness, weakness, light-headedness and headaches.  Psychiatric/Behavioral: Negative for dysphoric mood. The patient is not nervous/anxious.   All other systems reviewed and are negative.      Objective:   Physical Exam  BP 128/82  Pulse 89  Temp(Src) 98.5 F (36.9 C) (Oral)  Ht 5\' 3"  (1.6 m)  Wt 190 lb 8 oz (86.41 kg)  BMI 33.75 kg/m2  SpO2 96% Wt Readings from Last 3 Encounters:  05/01/14 190 lb 8 oz (86.41 kg)  01/16/14 193 lb 6.4 oz (87.726 kg)  01/10/14 194 lb (87.998 kg)   Constitutional: She is overweight, appears well-developed and well-nourished. No distress.    HENT: Head: Normocephalic and atraumatic. Ears: B TMs ok, no erythema or effusion; Nose: Nose normal. Mouth/Throat: Oropharynx is clear and moist. No oropharyngeal exudate.  Eyes: Conjunctivae and EOM are normal. Pupils are equal, round, and reactive to light. No scleral icterus.  Neck: Normal range of motion. Neck supple. No JVD present. No thyromegaly present.  Cardiovascular: Normal rate, regular rhythm and normal heart sounds.  No murmur heard. No BLE edema. Pulmonary/Chest: Effort normal and breath sounds normal. No respiratory distress. She has no wheezes.  Abdominal: Soft. Bowel sounds are normal. She exhibits no distension. There is no tenderness. no masses GU/breast: defer to gyn Musculoskeletal: Normal range of motion, no joint effusions. No gross deformities Neurological: She is alert and oriented to person, place, and time. No cranial nerve deficit. Coordination, balance, strength, speech and gait are normal.  Skin: Skin is warm and dry. No rash noted. No erythema.  Psychiatric: She has a normal mood and affect. Her behavior is normal. Judgment and thought content normal.    Lab Results  Component Value Date   WBC 8.2 04/11/2013   HGB 13.5 04/11/2013   HCT 38.9 04/11/2013   PLT 467.0* 04/11/2013   GLUCOSE 90 04/11/2013   CHOL 218* 04/11/2013   TRIG 304.0* 04/11/2013   HDL 77.90 04/11/2013   LDLDIRECT 106.7 04/11/2013   ALT 18 04/11/2013   AST 18 04/11/2013   NA 134* 04/11/2013   K 4.6  04/11/2013   CL 100 04/11/2013   CREATININE 0.8 04/11/2013   BUN 11 04/11/2013   CO2 28 04/11/2013   TSH 1.43 10/30/2013    Dg Lumbar Spine Complete  04/23/2013   CLINICAL DATA:  Lower back pain. Pain in the knee. No known injury.  EXAM: LUMBAR SPINE - COMPLETE 4+ VIEW  COMPARISON:  None  FINDINGS: There is no evidence of lumbar spine fracture. Alignment is normal. Intervertebral disc spaces are maintained.  IMPRESSION: Negative.   Electronically Signed   By: Shon Hale M.D.   On: 04/23/2013 09:13       Assessment &  Plan:   CPX/v70.0 - Patient has been counseled on age-appropriate routine health concerns for screening and prevention. These are reviewed and up-to-date. Immunizations are up-to-date or declined. Labs ordered and reviewed.  Problem List Items Addressed This Visit   ANXIETY DISORDER     on current meds (buproprion) since fall 2011 - in counseling with pscy NP (prev [facility] Child psychotherapist, now [employer]) -reports symptoms improved despite ongoing social, financial and marital stress On lexapro since start of 2015, readdition of Wellbutrin summer 2015 with ongoing SSRI Concerned about wight gain and considering new rx - to follow up on same with psyc provider Verified no SI/HI - support offered today    IBS (irritable bowel syndrome)     Hx and education on same provided followup labs as for CPX Try probiotic OTC    Relevant Medications      Probiotic Product (ADVANCED PROBIOTIC) CAPS   Mixed hyperlipidemia     On statin since spring 2013 Check now, adjust as needed Continue work on diet and exercise     Other Visit Diagnoses   Routine general medical examination at a health care facility    -  Primary    Need for prophylactic vaccination and inoculation against influenza        Relevant Orders       Flu Vaccine QUAD 36+ mos PF IM (Fluarix Quad PF) (Completed)

## 2014-05-01 NOTE — Assessment & Plan Note (Signed)
on current meds (buproprion) since fall 2011 - in counseling with pscy NP (prev [facility] Child psychotherapist, now [employer]) -reports symptoms improved despite ongoing social, financial and marital stress On lexapro since start of 2015, readdition of Wellbutrin summer 2015 with ongoing SSRI Concerned about wight gain and considering new rx - to follow up on same with psyc provider Verified no SI/HI - support offered today

## 2014-05-01 NOTE — Patient Instructions (Addendum)
It was good to see you today.  We have reviewed your prior records including labs and tests today  Your annual flu shot was given and/or updated today.  Health Maintenance reviewed - all recommended immunizations and age-appropriate screenings are up-to-date.  Test(s) completed today. Your results will be released to [place] (or called to you) after review, usually within 72hours after test completion. If any changes need to be made, you will be notified at that same time.  Medications reviewed and updated, no changes recommended at this time.  Please schedule followup in 12 months for annual exam and labs, call sooner if problems.  Health Maintenance Adopting a healthy lifestyle and getting preventive care can go a long way to promote health and wellness. Talk with your health care provider about what schedule of regular examinations is right for you. This is a good chance for you to check in with your provider about disease prevention and staying healthy. In between checkups, there are plenty of things you can do on your own. Experts have done a lot of research about which lifestyle changes and preventive measures are most likely to keep you healthy. Ask your health care provider for more information. WEIGHT AND DIET  Eat a healthy diet  Be sure to include plenty of vegetables, fruits, low-fat dairy products, and lean protein.  Do not eat a lot of foods high in solid fats, added sugars, or salt.  Get regular exercise. This is one of the most important things you can do for your health.  Most adults should exercise for at least 150 minutes each week. The exercise should increase your heart rate and make you sweat (moderate-intensity exercise).  Most adults should also do strengthening exercises at least twice a week. This is in addition to the moderate-intensity exercise.  Maintain a healthy weight  Body mass index (BMI) is a measurement that can be used to identify possible weight  problems. It estimates body fat based on height and weight. Your health care provider can help determine your BMI and help you achieve or maintain a healthy weight.  For females 56 years of age and older:   A BMI below 18.5 is considered underweight.  A BMI of 18.5 to 24.9 is normal.  A BMI of 25 to 29.9 is considered overweight.  A BMI of 30 and above is considered obese.  Watch levels of cholesterol and blood lipids  You should start having your blood tested for lipids and cholesterol at 46 years of age, then have this test every 5 years.  You may need to have your cholesterol levels checked more often if:  Your lipid or cholesterol levels are high.  You are older than 46 years of age.  You are at high risk for heart disease.  CANCER SCREENING   Lung Cancer  Lung cancer screening is recommended for adults 75-85 years old who are at high risk for lung cancer because of a history of smoking.  A yearly low-dose CT scan of the lungs is recommended for people who:  Currently smoke.  Have quit within the past 15 years.  Have at least a 30-pack-year history of smoking. A pack year is smoking an average of one pack of cigarettes a day for 1 year.  Yearly screening should continue until it has been 15 years since you quit.  Yearly screening should stop if you develop a health problem that would prevent you from having lung cancer treatment.  Breast Cancer  Practice breast  self-awareness. This means understanding how your breasts normally appear and feel.  It also means doing regular breast self-exams. Let your health care provider know about any changes, no matter how small.  If you are in your 20s or 30s, you should have a clinical breast exam (CBE) by a health care provider every 1-3 years as part of a regular health exam.  If you are 40 or older, have a CBE every year. Also consider having a breast X-ray (mammogram) every year.  If you have a family history of breast  cancer, talk to your health care provider about genetic screening.  If you are at high risk for breast cancer, talk to your health care provider about having an MRI and a mammogram every year.  Breast cancer gene (BRCA) assessment is recommended for women who have family members with BRCA-related cancers. BRCA-related cancers include:  Breast.  Ovarian.  Tubal.  Peritoneal cancers.  Results of the assessment will determine the need for genetic counseling and BRCA1 and BRCA2 testing. Cervical Cancer Routine pelvic examinations to screen for cervical cancer are no longer recommended for nonpregnant women who are considered low risk for cancer of the pelvic organs (ovaries, uterus, and vagina) and who do not have symptoms. A pelvic examination may be necessary if you have symptoms including those associated with pelvic infections. Ask your health care provider if a screening pelvic exam is right for you.   The Pap test is the screening test for cervical cancer for women who are considered at risk.  If you had a hysterectomy for a problem that was not cancer or a condition that could lead to cancer, then you no longer need Pap tests.  If you are older than 65 years, and you have had normal Pap tests for the past 10 years, you no longer need to have Pap tests.  If you have had past treatment for cervical cancer or a condition that could lead to cancer, you need Pap tests and screening for cancer for at least 20 years after your treatment.  If you no longer get a Pap test, assess your risk factors if they change (such as having a new sexual partner). This can affect whether you should start being screened again.  Some women have medical problems that increase their chance of getting cervical cancer. If this is the case for you, your health care provider may recommend more frequent screening and Pap tests.  The human papillomavirus (HPV) test is another test that may be used for cervical  cancer screening. The HPV test looks for the virus that can cause cell changes in the cervix. The cells collected during the Pap test can be tested for HPV.  The HPV test can be used to screen women 77 years of age and older. Getting tested for HPV can extend the interval between normal Pap tests from three to five years.  An HPV test also should be used to screen women of any age who have unclear Pap test results.  After 46 years of age, women should have HPV testing as often as Pap tests.  Colorectal Cancer  This type of cancer can be detected and often prevented.  Routine colorectal cancer screening usually begins at 46 years of age and continues through 46 years of age.  Your health care provider may recommend screening at an earlier age if you have risk factors for colon cancer.  Your health care provider may also recommend using home test kits to check  for hidden blood in the stool.  A small camera at the end of a tube can be used to examine your colon directly (sigmoidoscopy or colonoscopy). This is done to check for the earliest forms of colorectal cancer.  Routine screening usually begins at age 104.  Direct examination of the colon should be repeated every 5-10 years through 46 years of age. However, you may need to be screened more often if early forms of precancerous polyps or small growths are found. Skin Cancer  Check your skin from head to toe regularly.  Tell your health care provider about any new moles or changes in moles, especially if there is a change in a mole's shape or color.  Also tell your health care provider if you have a mole that is larger than the size of a pencil eraser.  Always use sunscreen. Apply sunscreen liberally and repeatedly throughout the day.  Protect yourself by wearing long sleeves, pants, a wide-brimmed hat, and sunglasses whenever you are outside. HEART DISEASE, DIABETES, AND HIGH BLOOD PRESSURE   Have your blood pressure checked at  least every 1-2 years. High blood pressure causes heart disease and increases the risk of stroke.  If you are between 3 years and 57 years old, ask your health care provider if you should take aspirin to prevent strokes.  Have regular diabetes screenings. This involves taking a blood sample to check your fasting blood sugar level.  If you are at a normal weight and have a low risk for diabetes, have this test once every three years after 46 years of age.  If you are overweight and have a high risk for diabetes, consider being tested at a younger age or more often. PREVENTING INFECTION  Hepatitis B  If you have a higher risk for hepatitis B, you should be screened for this virus. You are considered at high risk for hepatitis B if:  You were born in a country where hepatitis B is common. Ask your health care provider which countries are considered high risk.  Your parents were born in a high-risk country, and you have not been immunized against hepatitis B (hepatitis B vaccine).  You have HIV or AIDS.  You use needles to inject street drugs.  You live with someone who has hepatitis B.  You have had sex with someone who has hepatitis B.  You get hemodialysis treatment.  You take certain medicines for conditions, including cancer, organ transplantation, and autoimmune conditions. Hepatitis C  Blood testing is recommended for:  Everyone born from [birth year] through 1963.  Anyone with known risk factors for hepatitis C. Sexually transmitted infections (STIs)  You should be screened for sexually transmitted infections (STIs) including gonorrhea and chlamydia if:  You are sexually active and are younger than 46 years of age.  You are older than 46 years of age and your health care provider tells you that you are at risk for this type of infection.  Your sexual activity has changed since you were last screened and you are at an increased risk for chlamydia or gonorrhea. Ask your health  care provider if you are at risk.  If you do not have HIV, but are at risk, it may be recommended that you take a prescription medicine daily to prevent HIV infection. This is called pre-exposure prophylaxis (PrEP). You are considered at risk if:  You are sexually active and do not regularly use condoms or know the HIV status of your partner(s).  You take drugs  by injection.  You are sexually active with a partner who has HIV. Talk with your health care provider about whether you are at high risk of being infected with HIV. If you choose to begin PrEP, you should first be tested for HIV. You should then be tested every 3 months for as long as you are taking PrEP.  PREGNANCY   If you are premenopausal and you may become pregnant, ask your health care provider about preconception counseling.  If you may become pregnant, take 400 to 800 micrograms (mcg) of folic acid every day.  If you want to prevent pregnancy, talk to your health care provider about birth control (contraception). OSTEOPOROSIS AND MENOPAUSE   Osteoporosis is a disease in which the bones lose minerals and strength with aging. This can result in serious bone fractures. Your risk for osteoporosis can be identified using a bone density scan.  If you are 50 years of age or older, or if you are at risk for osteoporosis and fractures, ask your health care provider if you should be screened.  Ask your health care provider whether you should take a calcium or vitamin D supplement to lower your risk for osteoporosis.  Menopause may have certain physical symptoms and risks.  Hormone replacement therapy may reduce some of these symptoms and risks. Talk to your health care provider about whether hormone replacement therapy is right for you.  HOME CARE INSTRUCTIONS   Schedule regular health, dental, and eye exams.  Stay current with your immunizations.   Do not use any tobacco products including cigarettes, chewing tobacco, or  electronic cigarettes.  If you are pregnant, do not drink alcohol.  If you are breastfeeding, limit how much and how often you drink alcohol.  Limit alcohol intake to no more than 1 drink per day for nonpregnant women. One drink equals 12 ounces of beer, 5 ounces of wine, or 1 ounces of hard liquor.  Do not use street drugs.  Do not share needles.  Ask your health care provider for help if you need support or information about quitting drugs.  Tell your health care provider if you often feel depressed.  Tell your health care provider if you have ever been abused or do not feel safe at home. Document Released: 02/08/2011 Document Revised: 12/10/2013 Document Reviewed: 06/27/2013 [facility] Patient Information 2015 [place], [place]. This information is not intended to replace advice given to you by your health care provider. Make sure you discuss any questions you have with your health care provider.

## 2014-05-01 NOTE — Assessment & Plan Note (Signed)
Hx and education on same provided followup labs as for CPX Try probiotic OTC

## 2014-07-11 ENCOUNTER — Other Ambulatory Visit: Payer: Self-pay | Admitting: Internal Medicine

## 2014-12-16 ENCOUNTER — Other Ambulatory Visit: Payer: Self-pay | Admitting: Internal Medicine

## 2014-12-17 ENCOUNTER — Other Ambulatory Visit: Payer: Self-pay

## 2014-12-17 MED ORDER — BETAMETHASONE DIPROPIONATE AUG 0.05 % EX CREA: TOPICAL_CREAM | Freq: Two times a day (BID) | CUTANEOUS | Status: DC

## 2015-01-27 ENCOUNTER — Telehealth: Payer: Self-pay | Admitting: Internal Medicine

## 2015-01-27 NOTE — Telephone Encounter (Signed)
Patient has a physical on 10/24 and she was hoping she could get her lab work done ahead of time. Please advise

## 2015-01-28 NOTE — Telephone Encounter (Signed)
Mychart message sent.

## 2015-03-21 ENCOUNTER — Other Ambulatory Visit: Payer: Self-pay | Admitting: Internal Medicine

## 2015-06-02 ENCOUNTER — Encounter: Payer: Self-pay | Admitting: Internal Medicine

## 2015-06-02 ENCOUNTER — Ambulatory Visit (INDEPENDENT_AMBULATORY_CARE_PROVIDER_SITE_OTHER): Payer: BLUE CROSS/BLUE SHIELD | Admitting: Internal Medicine

## 2015-06-02 VITALS — BP 138/84 | HR 100 | Temp 98.4°F | Ht 63.0 in | Wt 191.5 lb

## 2015-06-02 DIAGNOSIS — Z23 Encounter for immunization: Secondary | ICD-10-CM

## 2015-06-02 DIAGNOSIS — Z0000 Encounter for general adult medical examination without abnormal findings: Secondary | ICD-10-CM | POA: Diagnosis not present

## 2015-06-02 DIAGNOSIS — Z114 Encounter for screening for human immunodeficiency virus [HIV]: Secondary | ICD-10-CM | POA: Diagnosis not present

## 2015-06-02 DIAGNOSIS — F411 Generalized anxiety disorder: Secondary | ICD-10-CM | POA: Diagnosis not present

## 2015-06-02 NOTE — Assessment & Plan Note (Signed)
on buproprion since fall 2011 - in counseling with pscy NP (prev [facility] psychotherapist, now [employer]) -reports symptoms improved despite ongoing social, financial and marital stress Prev on lexapro since start of 2015, readdition of Wellbutrin summer 2015 with ongoing SSRI Changed to SNRI (trintellex) due to weight gain on lexapro continue follow up on same with psyc provider Verified no SI/HI - support offered today

## 2015-06-02 NOTE — Patient Instructions (Addendum)
It was good to see you today.  We have reviewed your prior records including labs and tests today  Your annual flu shot was given and/or updated today.  Other Health Maintenance reviewed - all recommended immunizations and age-appropriate screenings are up-to-date.  Test(s) ordered today. Return when you are fasting. Your results will be released to [place] (or called to you) after review, usually within 72hours after test completion. If any changes need to be made, you will be notified at that same time.  Medications reviewed and updated, no changes recommended at this time.  Please schedule followup in 12 months for annual exam and labs, call sooner if problems.  Health Maintenance, Female Adopting a healthy lifestyle and getting preventive care can go a long way to promote health and wellness. Talk with your health care provider about what schedule of regular examinations is right for you. This is a good chance for you to check in with your provider about disease prevention and staying healthy. In between checkups, there are plenty of things you can do on your own. Experts have done a lot of research about which lifestyle changes and preventive measures are most likely to keep you healthy. Ask your health care provider for more information. WEIGHT AND DIET  Eat a healthy diet  Be sure to include plenty of vegetables, fruits, low-fat dairy products, and lean protein.  Do not eat a lot of foods high in solid fats, added sugars, or salt.  Get regular exercise. This is one of the most important things you can do for your health.  Most adults should exercise for at least 150 minutes each week. The exercise should increase your heart rate and make you sweat (moderate-intensity exercise).  Most adults should also do strengthening exercises at least twice a week. This is in addition to the moderate-intensity exercise.  Maintain a healthy weight  Body mass index (BMI) is a measurement that  can be used to identify possible weight problems. It estimates body fat based on height and weight. Your health care provider can help determine your BMI and help you achieve or maintain a healthy weight.  For females 46 years of age and older:   A BMI below 18.5 is considered underweight.  A BMI of 18.5 to 24.9 is normal.  A BMI of 25 to 29.9 is considered overweight.  A BMI of 30 and above is considered obese.  Watch levels of cholesterol and blood lipids  You should start having your blood tested for lipids and cholesterol at 47 years of age, then have this test every 5 years.  You may need to have your cholesterol levels checked more often if:  Your lipid or cholesterol levels are high.  You are older than 47 years of age.  You are at high risk for heart disease.  CANCER SCREENING   Lung Cancer  Lung cancer screening is recommended for adults 22-61 years old who are at high risk for lung cancer because of a history of smoking.  A yearly low-dose CT scan of the lungs is recommended for people who:  Currently smoke.  Have quit within the past 15 years.  Have at least a 30-pack-year history of smoking. A pack year is smoking an average of one pack of cigarettes a day for 1 year.  Yearly screening should continue until it has been 15 years since you quit.  Yearly screening should stop if you develop a health problem that would prevent you from having lung cancer  treatment.  Breast Cancer  Practice breast self-awareness. This means understanding how your breasts normally appear and feel.  It also means doing regular breast self-exams. Let your health care provider know about any changes, no matter how small.  If you are in your 20s or 30s, you should have a clinical breast exam (CBE) by a health care provider every 1-3 years as part of a regular health exam.  If you are 42 or older, have a CBE every year. Also consider having a breast X-ray (mammogram) every  year.  If you have a family history of breast cancer, talk to your health care provider about genetic screening.  If you are at high risk for breast cancer, talk to your health care provider about having an MRI and a mammogram every year.  Breast cancer gene (BRCA) assessment is recommended for women who have family members with BRCA-related cancers. BRCA-related cancers include:  Breast.  Ovarian.  Tubal.  Peritoneal cancers.  Results of the assessment will determine the need for genetic counseling and BRCA1 and BRCA2 testing. Cervical Cancer Your health care provider may recommend that you be screened regularly for cancer of the pelvic organs (ovaries, uterus, and vagina). This screening involves a pelvic examination, including checking for microscopic changes to the surface of your cervix (Pap test). You may be encouraged to have this screening done every 3 years, beginning at age 67.  For women ages 59-65, health care providers may recommend pelvic exams and Pap testing every 3 years, or they may recommend the Pap and pelvic exam, combined with testing for human papilloma virus (HPV), every 5 years. Some types of HPV increase your risk of cervical cancer. Testing for HPV may also be done on women of any age with unclear Pap test results.  Other health care providers may not recommend any screening for nonpregnant women who are considered low risk for pelvic cancer and who do not have symptoms. Ask your health care provider if a screening pelvic exam is right for you.  If you have had past treatment for cervical cancer or a condition that could lead to cancer, you need Pap tests and screening for cancer for at least 20 years after your treatment. If Pap tests have been discontinued, your risk factors (such as having a new sexual partner) need to be reassessed to determine if screening should resume. Some women have medical problems that increase the chance of getting cervical cancer. In  these cases, your health care provider may recommend more frequent screening and Pap tests. Colorectal Cancer  This type of cancer can be detected and often prevented.  Routine colorectal cancer screening usually begins at 47 years of age and continues through 47 years of age.  Your health care provider may recommend screening at an earlier age if you have risk factors for colon cancer.  Your health care provider may also recommend using home test kits to check for hidden blood in the stool.  A small camera at the end of a tube can be used to examine your colon directly (sigmoidoscopy or colonoscopy). This is done to check for the earliest forms of colorectal cancer.  Routine screening usually begins at age 57.  Direct examination of the colon should be repeated every 5-10 years through 47 years of age. However, you may need to be screened more often if early forms of precancerous polyps or small growths are found. Skin Cancer  Check your skin from head to toe regularly.  Tell your  health care provider about any new moles or changes in moles, especially if there is a change in a mole's shape or color.  Also tell your health care provider if you have a mole that is larger than the size of a pencil eraser.  Always use sunscreen. Apply sunscreen liberally and repeatedly throughout the day.  Protect yourself by wearing long sleeves, pants, a wide-brimmed hat, and sunglasses whenever you are outside. HEART DISEASE, DIABETES, AND HIGH BLOOD PRESSURE   High blood pressure causes heart disease and increases the risk of stroke. High blood pressure is more likely to develop in:  People who have blood pressure in the high end of the normal range (130-139/85-89 mm Hg).  People who are overweight or obese.  People who are African American.  If you are 4-20 years of age, have your blood pressure checked every 3-5 years. If you are 64 years of age or older, have your blood pressure checked  every year. You should have your blood pressure measured twice--once when you are at a hospital or clinic, and once when you are not at a hospital or clinic. Record the average of the two measurements. To check your blood pressure when you are not at a hospital or clinic, you can use:  An automated blood pressure machine at a pharmacy.  A home blood pressure monitor.  If you are between 11 years and 56 years old, ask your health care provider if you should take aspirin to prevent strokes.  Have regular diabetes screenings. This involves taking a blood sample to check your fasting blood sugar level.  If you are at a normal weight and have a low risk for diabetes, have this test once every three years after 47 years of age.  If you are overweight and have a high risk for diabetes, consider being tested at a younger age or more often. PREVENTING INFECTION  Hepatitis B  If you have a higher risk for hepatitis B, you should be screened for this virus. You are considered at high risk for hepatitis B if:  You were born in a country where hepatitis B is common. Ask your health care provider which countries are considered high risk.  Your parents were born in a high-risk country, and you have not been immunized against hepatitis B (hepatitis B vaccine).  You have HIV or AIDS.  You use needles to inject street drugs.  You live with someone who has hepatitis B.  You have had sex with someone who has hepatitis B.  You get hemodialysis treatment.  You take certain medicines for conditions, including cancer, organ transplantation, and autoimmune conditions. Hepatitis C  Blood testing is recommended for:  Everyone born from [birth year] through 1963.  Anyone with known risk factors for hepatitis C. Sexually transmitted infections (STIs)  You should be screened for sexually transmitted infections (STIs) including gonorrhea and chlamydia if:  You are sexually active and are younger than 47 years  of age.  You are older than 47 years of age and your health care provider tells you that you are at risk for this type of infection.  Your sexual activity has changed since you were last screened and you are at an increased risk for chlamydia or gonorrhea. Ask your health care provider if you are at risk.  If you do not have HIV, but are at risk, it may be recommended that you take a prescription medicine daily to prevent HIV infection. This is called pre-exposure prophylaxis (PrEP).  You are considered at risk if:  You are sexually active and do not regularly use condoms or know the HIV status of your partner(s).  You take drugs by injection.  You are sexually active with a partner who has HIV. Talk with your health care provider about whether you are at high risk of being infected with HIV. If you choose to begin PrEP, you should first be tested for HIV. You should then be tested every 3 months for as long as you are taking PrEP.  PREGNANCY   If you are premenopausal and you may become pregnant, ask your health care provider about preconception counseling.  If you may become pregnant, take 400 to 800 micrograms (mcg) of folic acid every day.  If you want to prevent pregnancy, talk to your health care provider about birth control (contraception). OSTEOPOROSIS AND MENOPAUSE   Osteoporosis is a disease in which the bones lose minerals and strength with aging. This can result in serious bone fractures. Your risk for osteoporosis can be identified using a bone density scan.  If you are 22 years of age or older, or if you are at risk for osteoporosis and fractures, ask your health care provider if you should be screened.  Ask your health care provider whether you should take a calcium or vitamin D supplement to lower your risk for osteoporosis.  Menopause may have certain physical symptoms and risks.  Hormone replacement therapy may reduce some of these symptoms and risks. Talk to your  health care provider about whether hormone replacement therapy is right for you.  HOME CARE INSTRUCTIONS   Schedule regular health, dental, and eye exams.  Stay current with your immunizations.   Do not use any tobacco products including cigarettes, chewing tobacco, or electronic cigarettes.  If you are pregnant, do not drink alcohol.  If you are breastfeeding, limit how much and how often you drink alcohol.  Limit alcohol intake to no more than 1 drink per day for nonpregnant women. One drink equals 12 ounces of beer, 5 ounces of wine, or 1 ounces of hard liquor.  Do not use street drugs.  Do not share needles.  Ask your health care provider for help if you need support or information about quitting drugs.  Tell your health care provider if you often feel depressed.  Tell your health care provider if you have ever been abused or do not feel safe at home.   This information is not intended to replace advice given to you by your health care provider. Make sure you discuss any questions you have with your health care provider.   Document Released: 02/08/2011 Document Revised: 08/16/2014 Document Reviewed: 06/27/2013 Elsevier Interactive Patient Education [employer].

## 2015-06-02 NOTE — Progress Notes (Signed)
Pre visit review using our clinic review tool, if applicable. No additional management support is needed unless otherwise documented below in the visit note. 

## 2015-06-02 NOTE — Progress Notes (Signed)
Subjective:    Patient ID: Jessica [name], female    DOB: [birth year], 49 y.o.   MRN: [id number]  HPI  patient is here today for annual physical. Patient feels well in general Also reviewed chronic medical conditions, interval events and current concerns  Past Medical History  Diagnosis Date  . DYSPLASTIC NEVUS, CHEST   . THROMBOCYTOSIS   . CYSTIC ACNE   . INSOMNIA   . ELEVATED BP READING WITHOUT DX HYPERTENSION   . DEPRESSION   . ALLERGIC RHINITIS CAUSE UNSPECIFIED   . ANXIETY DISORDER   . Mixed hyperlipidemia    Family History  Problem Relation Age of Onset  . Hyperlipidemia Father   . Hypertension Father   . Depression Sister   . Coronary artery disease Paternal Grandfather    Social History  Substance Use Topics  . Smoking status: Never Smoker   . Smokeless tobacco: Never Used  . Alcohol Use: No    Review of Systems  Constitutional: Positive for fatigue. Negative for unexpected weight change.  Respiratory: Negative for cough, shortness of breath and wheezing.   Cardiovascular: Negative for chest pain, palpitations and leg swelling.  Gastrointestinal: Negative for nausea, abdominal pain and diarrhea.  Musculoskeletal: Positive for myalgias and arthralgias.  Neurological: Negative for dizziness, weakness, light-headedness and headaches.  Psychiatric/Behavioral: Negative for dysphoric mood. The patient is not nervous/anxious.   All other systems reviewed and are negative.      Objective:    Physical Exam  Constitutional: She is oriented to person, place, and time. She appears well-developed and well-nourished. No distress.  obese  HENT:  Head: Normocephalic and atraumatic.  Right Ear: External ear normal.  Left Ear: External ear normal.  Nose: Nose normal.  Mouth/Throat: Oropharynx is clear and moist. No oropharyngeal exudate.  Eyes: EOM are normal. Pupils are equal, round, and reactive to light. Right eye exhibits no discharge. Left eye exhibits no  discharge. No scleral icterus.  Neck: Normal range of motion. Neck supple. No JVD present. No tracheal deviation present. No thyromegaly present.  Cardiovascular: Normal rate, regular rhythm, normal heart sounds and intact distal pulses.  Exam reveals no friction rub.   No murmur heard. Pulmonary/Chest: Effort normal and breath sounds normal. No respiratory distress. She has no wheezes. She has no rales. She exhibits no tenderness.  Abdominal: Soft. Bowel sounds are normal. She exhibits no distension and no mass. There is no tenderness. There is no rebound and no guarding.  Musculoskeletal: Normal range of motion.  Lymphadenopathy:    She has no cervical adenopathy.  Neurological: She is alert and oriented to person, place, and time. She has normal reflexes. No cranial nerve deficit.  Skin: Skin is warm and dry. No rash noted. She is not diaphoretic. No erythema.  Psychiatric: She has a normal mood and affect. Her behavior is normal. Judgment and thought content normal.  Nursing note and vitals reviewed.   BP 138/84 mmHg  Pulse 100  Temp(Src) 98.4 F (36.9 C) (Oral)  Ht 5\' 3"  (1.6 m)  Wt 191 lb 8 oz (86.864 kg)  BMI 33.93 kg/m2  SpO2 96% Wt Readings from Last 3 Encounters:  06/02/15 191 lb 8 oz (86.864 kg)  05/01/14 190 lb 8 oz (86.41 kg)  01/16/14 193 lb 6.4 oz (87.726 kg)    Lab Results  Component Value Date   WBC 9.0 05/01/2014   HGB 12.9 05/01/2014   HCT 38.4 05/01/2014   PLT 423.0* 05/01/2014   GLUCOSE 89 05/01/2014  CHOL 218* 04/11/2013   TRIG 304.0* 04/11/2013   HDL 77.90 04/11/2013   LDLDIRECT 106.7 04/11/2013   ALT 18 05/01/2014   AST 17 05/01/2014   NA 135 05/01/2014   K 4.4 05/01/2014   CL 103 05/01/2014   CREATININE 0.7 05/01/2014   BUN 13 05/01/2014   CO2 26 05/01/2014   TSH 3.30 05/01/2014    Dg Lumbar Spine Complete  04/23/2013  CLINICAL DATA:  Lower back pain. Pain in the knee. No known injury. EXAM: LUMBAR SPINE - COMPLETE 4+ VIEW COMPARISON:   None FINDINGS: There is no evidence of lumbar spine fracture. Alignment is normal. Intervertebral disc spaces are maintained. IMPRESSION: Negative. Electronically Signed   By: Shon Hale M.D.   On: 04/23/2013 09:13       Assessment & Plan:   CPX/z00.00 - Patient has been counseled on age-appropriate routine health concerns for screening and prevention. These are reviewed and up-to-date. Immunizations are up-to-date or declined. Labs ordered and reviewed.  Problem List Items Addressed This Visit    Anxiety state    on buproprion since fall 2011 - in counseling with pscy NP (prev [facility] psychotherapist, now [employer]) -reports symptoms improved despite ongoing social, financial and marital stress Prev on lexapro since start of 2015, readdition of Wellbutrin summer 2015 with ongoing SSRI Changed to SNRI (trintellex) due to weight gain on lexapro continue follow up on same with psyc provider Verified no SI/HI - support offered today      Relevant Medications   Vortioxetine HBr (TRINTELLIX) 20 MG TABS    Other Visit Diagnoses    Routine general medical examination at a health care facility    -  Primary    Relevant Orders    Basic metabolic panel    CBC with Differential/Platelet    Hepatic function panel    Lipid panel    TSH    Urinalysis, Routine w reflex microscopic (not at [facility])    Vit D  25 hydroxy (rtn osteoporosis monitoring)    Need for prophylactic vaccination and inoculation against influenza        Relevant Orders    Flu Vaccine QUAD 36+ mos IM (Completed)    Screening for HIV without presence of risk factors        Relevant Orders    HIV antibody        Gwendolyn Grant, MD

## 2015-08-25 ENCOUNTER — Other Ambulatory Visit: Payer: Self-pay | Admitting: Internal Medicine

## 2015-08-26 ENCOUNTER — Telehealth: Payer: Self-pay | Admitting: *Deleted

## 2015-08-26 MED ORDER — SIMVASTATIN 20 MG PO TABS: 20.0000 mg | ORAL_TABLET | Freq: Every day | ORAL | Status: DC

## 2015-08-26 NOTE — Telephone Encounter (Signed)
Received call pt states she is needing refill on her simvastatin. Inform pt she has to transfer care to new provider b4 refills can be sent in. Made appt for 1/24 w/Dr. Quay Burow will send 30 to [facility] until appt since pt is out of medication...Johny Chess

## 2015-09-02 ENCOUNTER — Other Ambulatory Visit (INDEPENDENT_AMBULATORY_CARE_PROVIDER_SITE_OTHER): Payer: BLUE CROSS/BLUE SHIELD

## 2015-09-02 ENCOUNTER — Ambulatory Visit (INDEPENDENT_AMBULATORY_CARE_PROVIDER_SITE_OTHER): Payer: BLUE CROSS/BLUE SHIELD | Admitting: Internal Medicine

## 2015-09-02 ENCOUNTER — Encounter: Payer: Self-pay | Admitting: Internal Medicine

## 2015-09-02 VITALS — BP 128/86 | HR 92 | Temp 98.1°F | Resp 16 | Ht 63.0 in | Wt 194.0 lb

## 2015-09-02 DIAGNOSIS — Z114 Encounter for screening for human immunodeficiency virus [HIV]: Secondary | ICD-10-CM

## 2015-09-02 DIAGNOSIS — F329 Major depressive disorder, single episode, unspecified: Secondary | ICD-10-CM | POA: Diagnosis not present

## 2015-09-02 DIAGNOSIS — K219 Gastro-esophageal reflux disease without esophagitis: Secondary | ICD-10-CM | POA: Diagnosis not present

## 2015-09-02 DIAGNOSIS — Z0000 Encounter for general adult medical examination without abnormal findings: Secondary | ICD-10-CM

## 2015-09-02 DIAGNOSIS — E782 Mixed hyperlipidemia: Secondary | ICD-10-CM

## 2015-09-02 DIAGNOSIS — F411 Generalized anxiety disorder: Secondary | ICD-10-CM | POA: Diagnosis not present

## 2015-09-02 DIAGNOSIS — F32A Depression, unspecified: Secondary | ICD-10-CM

## 2015-09-02 DIAGNOSIS — R7989 Other specified abnormal findings of blood chemistry: Secondary | ICD-10-CM

## 2015-09-02 LAB — URINALYSIS, ROUTINE W REFLEX MICROSCOPIC
BILIRUBIN URINE: NEGATIVE
Hgb urine dipstick: NEGATIVE
KETONES UR: NEGATIVE
NITRITE: NEGATIVE
PH: 7.5 (ref 5.0–8.0)
RBC / HPF: NONE SEEN (ref 0–?)
Specific Gravity, Urine: 1.01 (ref 1.000–1.030)
TOTAL PROTEIN, URINE-UPE24: NEGATIVE
URINE GLUCOSE: NEGATIVE
UROBILINOGEN UA: 0.2 (ref 0.0–1.0)

## 2015-09-02 LAB — HEPATIC FUNCTION PANEL
ALBUMIN: 3.7 g/dL (ref 3.5–5.2)
ALK PHOS: 105 U/L (ref 39–117)
ALT: 15 U/L (ref 0–35)
AST: 13 U/L (ref 0–37)
Bilirubin, Direct: 0.1 mg/dL (ref 0.0–0.3)
TOTAL PROTEIN: 7 g/dL (ref 6.0–8.3)
Total Bilirubin: 0.3 mg/dL (ref 0.2–1.2)

## 2015-09-02 LAB — CBC WITH DIFFERENTIAL/PLATELET
BASOS PCT: 0.4 % (ref 0.0–3.0)
Basophils Absolute: 0 10*3/uL (ref 0.0–0.1)
EOS PCT: 2.3 % (ref 0.0–5.0)
Eosinophils Absolute: 0.2 10*3/uL (ref 0.0–0.7)
HEMATOCRIT: 38.5 % (ref 36.0–46.0)
HEMOGLOBIN: 13 g/dL (ref 12.0–15.0)
LYMPHS PCT: 23.2 % (ref 12.0–46.0)
Lymphs Abs: 2.2 10*3/uL (ref 0.7–4.0)
MCHC: 33.8 g/dL (ref 30.0–36.0)
MCV: 82.7 fl (ref 78.0–100.0)
Monocytes Absolute: 0.7 10*3/uL (ref 0.1–1.0)
Monocytes Relative: 7.9 % (ref 3.0–12.0)
NEUTROS ABS: 6.2 10*3/uL (ref 1.4–7.7)
Neutrophils Relative %: 66.2 % (ref 43.0–77.0)
PLATELETS: 499 10*3/uL — AB (ref 150.0–400.0)
RBC: 4.65 Mil/uL (ref 3.87–5.11)
RDW: 12.8 % (ref 11.5–15.5)
WBC: 9.3 10*3/uL (ref 4.0–10.5)

## 2015-09-02 LAB — TSH: TSH: 2.21 u[IU]/mL (ref 0.35–4.50)

## 2015-09-02 LAB — BASIC METABOLIC PANEL
BUN: 11 mg/dL (ref 6–23)
CHLORIDE: 101 meq/L (ref 96–112)
CO2: 28 meq/L (ref 19–32)
Calcium: 9.2 mg/dL (ref 8.4–10.5)
Creatinine, Ser: 0.65 mg/dL (ref 0.40–1.20)
GFR: 103.69 mL/min (ref >60.00)
Glucose, Bld: 86 mg/dL (ref 70–99)
POTASSIUM: 4.4 meq/L (ref 3.5–5.1)
Sodium: 136 mEq/L (ref 135–145)

## 2015-09-02 LAB — LIPID PANEL
CHOLESTEROL: 219 mg/dL — AB (ref 0–200)
HDL: 70.6 mg/dL (ref >39.00)
NonHDL: 148.52
Total CHOL/HDL Ratio: 3
Triglycerides: 223 mg/dL — ABNORMAL HIGH (ref 0.0–149.0)
VLDL: 44.6 mg/dL — ABNORMAL HIGH (ref 0.0–40.0)

## 2015-09-02 LAB — VITAMIN D 25 HYDROXY (VIT D DEFICIENCY, FRACTURES): VITD: 21.33 ng/mL — ABNORMAL LOW (ref 30.00–100.00)

## 2015-09-02 LAB — LDL CHOLESTEROL, DIRECT: Direct LDL: 106 mg/dL

## 2015-09-02 MED ORDER — BETAMETHASONE DIPROPIONATE AUG 0.05 % EX CREA: TOPICAL_CREAM | Freq: Two times a day (BID) | CUTANEOUS | Status: DC | PRN

## 2015-09-02 MED ORDER — OMEPRAZOLE 20 MG PO CPDR: 20.0000 mg | DELAYED_RELEASE_CAPSULE | ORAL | Status: DC

## 2015-09-02 MED ORDER — SIMVASTATIN 20 MG PO TABS: 20.0000 mg | ORAL_TABLET | Freq: Every day | ORAL | Status: DC

## 2015-09-02 NOTE — Assessment & Plan Note (Signed)
Follows with psych NP - leslie O'neil Fairly controlled meds per psych

## 2015-09-02 NOTE — Progress Notes (Signed)
Subjective:    Patient ID: Jessica [name], female    DOB: March 29, 1966, 50 y.o.   MRN: [id number]  HPI She is here to establish with a new pcp.  She is here for routine follow up of her chronic medical problems.   Hyperlipidemia: She is taking her medication daily, but she missed 4-5 days of medication prior to the blood work today.  She is compliant with a low fat/cholesterol diet. She is exercising regularly - walking, walks at least 5000 steps a day and over 10000 on many days. She denies myalgias.   Depression, anxiety:  She is following with psychiatry.  She is taking her medication daily as prescribed.  She currently feels her depression and anxiety are fairly controlled.    GERD:  She takes otc omeprazole every other day. This controls her GERD.  She does avoid certain foods.   Medications and allergies reviewed with patient and updated if appropriate.  Patient Active Problem List   Diagnosis Date Noted  . IBS (irritable bowel syndrome) 05/01/2014  . Nonallopathic lesion of thoracic region 12/24/2013  . Greater trochanteric bursitis of left hip 04/30/2013  . Chronic low back pain 04/23/2013  . Sacroiliac joint dysfunction of left side 04/19/2013  . Greater trochanteric bursitis of right hip 04/11/2013  . Hip flexor tightness 04/11/2013  . Nonallopathic lesion of lumbosacral region 04/11/2013  . Nonallopathic lesion of pelvic region 04/11/2013  . Gait abnormality 05/09/2012  . Foot pain, right 09/21/2011  . CYSTIC ACNE 06/16/2010  . Depression 04/24/2010  . ALLERGIC RHINITIS CAUSE UNSPECIFIED 12/06/2008  . THROMBOCYTOSIS 05/07/2008  . 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 Mixed hyperlipidemia 04/22/2008  . INSOMNIA 04/22/2008  . Anxiety state 10/04/2007    Current Outpatient Prescriptions on File Prior to Visit  Medication Sig Dispense Refill  . buPROPion (WELLBUTRIN SR) 100 MG 12 hr tablet Take 100 mg by mouth daily.    . cetirizine (ZYRTEC) 10 MG tablet Take 10  mg by mouth as needed.      . ClonazePAM (KLONOPIN PO) Take 1 tablet by mouth 2 (two) times daily as needed.    . drospirenone-ethinyl estradiol (YAZ) 3-0.02 MG per tablet Take 1 tablet by mouth daily.      . fexofenadine (ALLEGRA) 180 MG tablet Take 180 mg by mouth as needed.    . Melatonin 3 MG TABS Take by mouth at bedtime as needed.     Marland Kitchen Phenylephrine HCl 5 MG TABS Take by mouth as needed.      . Vortioxetine HBr (TRINTELLIX) 20 MG TABS Take 20 mg by mouth daily. 30 tablet    No current facility-administered medications on file prior to visit.    Past Medical History  Diagnosis Date  . DYSPLASTIC NEVUS, CHEST   . THROMBOCYTOSIS   . CYSTIC ACNE   . INSOMNIA   . ELEVATED BP READING WITHOUT DX HYPERTENSION   . DEPRESSION   . ALLERGIC RHINITIS CAUSE UNSPECIFIED   . ANXIETY DISORDER   . Mixed hyperlipidemia     Past Surgical History  Procedure Laterality Date  . Breast surgery  2000    Breast reduction  . Deviated septum repair  1997    Social History   Social History  . Marital Status: Married    Spouse Name: N/A  . Number of Children: N/A  . Years of Education: N/A   Social History Main Topics  . Smoking status: Never Smoker   . Smokeless tobacco: Never Used  .  Alcohol Use: No  . Drug Use: No  . Sexual Activity: Not Asked   Other Topics Concern  . None   Social History Narrative   Married lives with spouse and two kids. She is originally from [place] area-moved to [place] 2004    Family History  Problem Relation Age of Onset  . Hyperlipidemia Father   . Hypertension Father   . Depression Sister   . Coronary artery disease Paternal Grandfather     Review of Systems  Constitutional: Negative for fever and diaphoresis.  HENT:       Increase gag reflex - to smells, brushing teeth  Respiratory: Negative for cough, shortness of breath and wheezing.   Cardiovascular: Negative for chest pain, palpitations and leg swelling.  Gastrointestinal: Negative for nausea  and abdominal pain.       GERD - controlled  Neurological: Positive for headaches (occ sinus ). Negative for dizziness and light-headedness.       Objective:   Filed Vitals:   09/02/15 0834  BP: 128/86  Pulse: 92  Temp: 98.1 F (36.7 C)  Resp: 16   Filed Weights   09/02/15 0834  Weight: 194 lb (87.998 kg)   Body mass index is 34.37 kg/(m^2).   Physical Exam Constitutional: Appears well-developed and well-nourished. No distress.  Neck: Neck supple. No tracheal deviation present. No thyromegaly present.  No carotid bruit. No cervical adenopathy.   Cardiovascular: Normal rate, regular rhythm and normal heart sounds.   No murmur heard.  No edema Pulmonary/Chest: Effort normal and breath sounds normal. No respiratory distress. No wheezes.       Assessment & Plan:    See Problem List for Assessment and Plan of chronic medical problems.  Follow up annually

## 2015-09-02 NOTE — Assessment & Plan Note (Signed)
Controlled with QOD prilosec

## 2015-09-02 NOTE — Assessment & Plan Note (Signed)
Lipid panel pending from this morning On simvastatin 20 mg daily Exercising Working on weight loss

## 2015-09-02 NOTE — Patient Instructions (Signed)
  We have reviewed your prior records including labs and tests today.   Your results will be released to [facility] (or called to you) after review, usually within 72hours after test completion. If any changes need to be made, you will be notified at that same time.  All other Health Maintenance issues reviewed.   All recommended immunizations and age-appropriate screenings are up-to-date.  No immunizations administered today.   Medications reviewed and updated.  No changes recommended at this time.   Please followup annually

## 2015-09-02 NOTE — Progress Notes (Signed)
Pre visit review using our clinic review tool, if applicable. No additional management support is needed unless otherwise documented below in the visit note. 

## 2015-09-03 ENCOUNTER — Encounter: Payer: Self-pay | Admitting: Internal Medicine

## 2015-09-03 LAB — HIV ANTIBODY (ROUTINE TESTING W REFLEX): HIV: NONREACTIVE

## 2015-10-21 ENCOUNTER — Telehealth: Payer: Self-pay | Admitting: Family Medicine

## 2015-10-21 NOTE — Telephone Encounter (Signed)
Patient states she is out of town.  States she has had something going on with right side of jaw being sore when she opens and closes mouth.  Would like to know if Dr. Tamala Julian has any recommendations of what to do while she is out of town for the pain.

## 2015-10-21 NOTE — Telephone Encounter (Signed)
I saw her last in 2015.  Ice 20 minutes 3 times a day  Consider ibuprofen if she can take it 600mg  3 times a day for 3 days.

## 2015-10-22 NOTE — Telephone Encounter (Signed)
Spoke with patient and gave recommendations.

## 2015-11-04 ENCOUNTER — Ambulatory Visit (INDEPENDENT_AMBULATORY_CARE_PROVIDER_SITE_OTHER): Payer: BLUE CROSS/BLUE SHIELD | Admitting: Family Medicine

## 2015-11-04 ENCOUNTER — Encounter: Payer: Self-pay | Admitting: Family Medicine

## 2015-11-04 VITALS — BP 126/84 | HR 81 | Ht 63.0 in | Wt 195.0 lb

## 2015-11-04 DIAGNOSIS — M9905 Segmental and somatic dysfunction of pelvic region: Secondary | ICD-10-CM | POA: Diagnosis not present

## 2015-11-04 DIAGNOSIS — M9902 Segmental and somatic dysfunction of thoracic region: Secondary | ICD-10-CM

## 2015-11-04 DIAGNOSIS — M7061 Trochanteric bursitis, right hip: Secondary | ICD-10-CM

## 2015-11-04 DIAGNOSIS — M79671 Pain in right foot: Secondary | ICD-10-CM

## 2015-11-04 DIAGNOSIS — M9903 Segmental and somatic dysfunction of lumbar region: Secondary | ICD-10-CM | POA: Diagnosis not present

## 2015-11-04 DIAGNOSIS — M545 Low back pain: Secondary | ICD-10-CM

## 2015-11-04 DIAGNOSIS — M999 Biomechanical lesion, unspecified: Secondary | ICD-10-CM

## 2015-11-04 DIAGNOSIS — G8929 Other chronic pain: Secondary | ICD-10-CM

## 2015-11-04 MED ORDER — MELOXICAM 15 MG PO TABS: 15.0000 mg | ORAL_TABLET | Freq: Every day | ORAL | Status: DC

## 2015-11-04 NOTE — Progress Notes (Signed)
Subjective:    CC: Low back pain followup Foot pain follow-up  HPI: Patient has not been seen for greater than 2 years. Patient has been traveling a lot more. Patient states that Home now and having some mild increase in stiffness, looking to become more active but has not been able to due to time and some pain .   Noticing more localized pain on lateral aspect of the right hip. No significant radiatoin, pain at night and with going up stairs. Had to stop a hike recently due to the pain.  Patient has worked with trainer and continues to stretch with only mild improvement.  Also has had foot pain , in custom orthotics. Was doing well but with working out more having more pain as well,  More pain medial aspect of foot with activity  Trying to avoid being barefoot. No swelling, no numbness.   Past Medical History  Diagnosis Date  . DYSPLASTIC NEVUS, CHEST   . THROMBOCYTOSIS   . CYSTIC ACNE   . INSOMNIA   . ELEVATED BP READING WITHOUT DX HYPERTENSION   . DEPRESSION   . ALLERGIC RHINITIS CAUSE UNSPECIFIED   . ANXIETY DISORDER   . Mixed hyperlipidemia    Past Surgical History  Procedure Laterality Date  . Breast surgery  2000    Breast reduction  . Deviated septum repair  1997   Social History  Substance Use Topics  . Smoking status: Never Smoker   . Smokeless tobacco: Never Used  . Alcohol Use: No   No Known Allergies Family History  Problem Relation Age of Onset  . Hyperlipidemia Father   . Hypertension Father   . Depression Sister   . Coronary artery disease Paternal Grandfather     Past medical history, Surgical history, Family history not pertinant except as noted below, Social history, Allergies, and medications have been entered into the medical record, reviewed, and no changes needed.   Review of Systems: No fevers, chills, night sweats, weight loss, chest pain, or shortness of breath.   Objective:   Blood pressure 126/84, pulse 81, height 5\' 3"  (1.6 m), weight  195 lb (88.451 kg), SpO2 97 %.  General: Well Developed, well nourished, and in no acute distress.  Neuro: Alert and oriented x3, extra-ocular muscles intact, sensation grossly intact.  HEENT: Normocephalic, atraumatic, pupils equal round reactive to light, neck supple, no masses, no lymphadenopathy, thyroid nonpalpable.  Skin: Warm and dry, no rashes.  Cardiac: no lower extremity edema.  Respiratory: Not using accessory muscles, speaking in full sentences.  Abdominal: NT, soft  Gait: Nonantlagic, good balance and coordination  Lymphatic: no lymphadenopathy in neck or axillae on palpation, non tender.  Musculoskeletal: Inspection and palpation of the right and left upper extremities including the shoulders elbows and wrist are unremarkable with full range of motion and good muscle strength and tone.  Inspection and palpation of the right and left lower extremities including the hips knees and ankles are unremarkable and nontender with full range of motion and good muscle strength and tone and are symmetric.  Foot exam mild breakdown of longitudinal arch a dn pain over plantaris on right foot.  Negative squeeze test, full ROM of ankles bilaterally. Good strength.   Back Exam:  Inspection: Unremarkable  Motion: Flexion 45 deg, Extension 25 deg, Side Bending to 45 deg bilaterally,  Rotation to 45 deg bilaterally  SLR laying: Negative XSLR laying: Negative  Palpable tenderness: TTP on right GT FABER: Positive on left but significantly less  painful than previously. Sensory change: Gross sensation intact to all lumbar and sacral dermatomes.  Reflexes: 2+ at both patellar tendons, 2+ at achilles tendons, Babinski's downgoing.  Strength at foot  Plantar-flexion: 5/5 Dorsi-flexion: 5/5 Eversion: 5/5 Inversion: 5/5  Leg strength  Quad: 5/5 Hamstring: 5/5 Hip flexor: 5/5 Hip abductors: 4/5   OMT Physical Exam   Cervical  C4 flexed rotated and side bent right Thoracic  T5 extended rotated and  side bent left  T7 extended rotated and side bent right  Lumbar  L2 flexed rotated and side bent left  Sacrum  Left on left  Illium  Neutral   Impression and Recommendations:

## 2015-11-04 NOTE — Assessment & Plan Note (Signed)
Patient's foot pain seems to be somewhat better. We discussed continuing the orthotics we discussed other shoes that would help with some of the midfoot as well as supporting the arch. Patient will try these different changes as well as some of her low-back pain. Patient come back and see me again in 4 weeks. Patient's custom orthotics seem to have 01/17/2011 months left of improvement.

## 2015-11-04 NOTE — Patient Instructions (Signed)
Good to see yo u Ice 20 minutes 2 times daily. Usually after activity and before bed. Exercises on wall.  Heel and butt touching.  Raise leg 6 inches and hold 2 seconds.  Down slow for count of 4 seconds.  1 set of 30 reps daily on both sides.  Talk to your trainer to focus on hip abductors meloxicam dialy for 10 days [employer] or Xelero shoes could be great  Vitamin D 4000 IU daily for 2 weeks then 2000 IU daily thereafter See me again in 4 weeks if more manipulation is needed or need injection in the hip

## 2015-11-04 NOTE — Assessment & Plan Note (Signed)
Decision today to treat with OMT was based on Physical Exam  After verbal consent patient was treated with HVLA, ME techniques in thoracic, lumbar, sacral and pelvic areas  Patient tolerated the procedure well with improvement in symptoms  Patient given exercises, stretches and lifestyle modifications  See medications in patient instructions if given  Patient will follow up in 4 weeks

## 2015-11-04 NOTE — Assessment & Plan Note (Signed)
I do believe the patient's chronic low back pain is giving patient's some greater trochanteric bursitis on the right side. We discussed hip abductor strengthening exercises. Patient will be working with a Clinical research associate. Patient did respond very well to osteopathic manipulation. Encourage weight loss and core strengthening. Patient come back and see me again in 4 weeks for further evaluation and treatment.

## 2015-11-04 NOTE — Assessment & Plan Note (Signed)
Topical anti-inflammatories as well as prescription for oral anti-inflammatories given. Discussed icing. Discussed proper shoes. Patient will wear orthotics on a more regular basis. Follow-up with me again in 4 weeks. If worsening symptoms consider injection.

## 2015-11-04 NOTE — Progress Notes (Signed)
Pre visit review using our clinic review tool, if applicable. No additional management support is needed unless otherwise documented below in the visit note. 

## 2015-12-02 ENCOUNTER — Encounter: Payer: Self-pay | Admitting: Family Medicine

## 2015-12-02 ENCOUNTER — Ambulatory Visit (INDEPENDENT_AMBULATORY_CARE_PROVIDER_SITE_OTHER): Payer: BLUE CROSS/BLUE SHIELD | Admitting: Family Medicine

## 2015-12-02 VITALS — BP 122/80 | HR 76 | Wt 195.0 lb

## 2015-12-02 DIAGNOSIS — M9905 Segmental and somatic dysfunction of pelvic region: Secondary | ICD-10-CM

## 2015-12-02 DIAGNOSIS — M9902 Segmental and somatic dysfunction of thoracic region: Secondary | ICD-10-CM | POA: Diagnosis not present

## 2015-12-02 DIAGNOSIS — M533 Sacrococcygeal disorders, not elsewhere classified: Secondary | ICD-10-CM

## 2015-12-02 DIAGNOSIS — M79671 Pain in right foot: Secondary | ICD-10-CM

## 2015-12-02 DIAGNOSIS — M9903 Segmental and somatic dysfunction of lumbar region: Secondary | ICD-10-CM | POA: Diagnosis not present

## 2015-12-02 DIAGNOSIS — M999 Biomechanical lesion, unspecified: Secondary | ICD-10-CM

## 2015-12-02 MED ORDER — MELOXICAM 15 MG PO TABS: 15.0000 mg | ORAL_TABLET | Freq: Every day | ORAL | Status: DC

## 2015-12-02 NOTE — Patient Instructions (Signed)
Good to see you  Try to change position at work a couple times during the day  Ice still is your friend Meloxicam 3 days straight when having pain  Wart removal liquid to decrease the callus Do not tie last eye on your shoes to give toes more room Get back on the horse and do the exercises  See me again in 4-6 weeks

## 2015-12-02 NOTE — Progress Notes (Signed)
Subjective:    CC: Low back pain followup Foot pain follow-up  HPI: patient hasn't been seen for more of a sacroiliac joint dysfunction. An states that she has not been doing exercises. Has been working a lot more which is made her more sedentary. Denies any numbness or tingling. Denies any weakness. States though no significant change. Does notice that if she does he exercises on a more regular basis she does better. Has responded well to osteopathic manipulation in the past.  In addition of this patient's continues to have some foot pain. Patient is still looking to get new shoes. Trying to avoid any type of flexion of the first toe if possible. Is getting a new callus on the right first toe. Wondering what she can possibly do.  Past Medical History  Diagnosis Date  . DYSPLASTIC NEVUS, CHEST   . THROMBOCYTOSIS   . CYSTIC ACNE   . INSOMNIA   . ELEVATED BP READING WITHOUT DX HYPERTENSION   . DEPRESSION   . ALLERGIC RHINITIS CAUSE UNSPECIFIED   . ANXIETY DISORDER   . Mixed hyperlipidemia    Past Surgical History  Procedure Laterality Date  . Breast surgery  2000    Breast reduction  . Deviated septum repair  1997   Social History  Substance Use Topics  . Smoking status: Never Smoker   . Smokeless tobacco: Never Used  . Alcohol Use: No   No Known Allergies Family History  Problem Relation Age of Onset  . Hyperlipidemia Father   . Hypertension Father   . Depression Sister   . Coronary artery disease Paternal Grandfather     Past medical history, Surgical history, Family history not pertinant except as noted below, Social history, Allergies, and medications have been entered into the medical record, reviewed, and no changes needed.   Review of Systems: No fevers, chills, night sweats, weight loss, chest pain, or shortness of breath.   Objective:   Blood pressure 122/80, pulse 76, weight 195 lb (88.451 kg).  General: Well Developed, well nourished, and in no acute  distress.  Neuro: Alert and oriented x3, extra-ocular muscles intact, sensation grossly intact.  HEENT: Normocephalic, atraumatic, pupils equal round reactive to light, neck supple, no masses, no lymphadenopathy, thyroid nonpalpable.  Skin: Warm and dry, no rashes.  Cardiac: no lower extremity edema.  Respiratory: Not using accessory muscles, speaking in full sentences.  Abdominal: NT, soft  Gait: Nonantlagic, good balance and coordination  Lymphatic: no lymphadenopathy in neck or axillae on palpation, non tender.  Musculoskeletal: Inspection and palpation of the right and left upper extremities including the shoulders elbows and wrist are unremarkable with full range of motion and good muscle strength and tone.  Inspection and palpation of the right and left lower extremities including the hips knees and ankles are unremarkable and nontender with full range of motion and good muscle strength and tone and are symmetric.  Foot exam mild breakdown of longitudinal arch a dn pain over plantaris on right foot.  Negative squeeze test, full ROM of ankles bilaterally. Good strength.   Back Exam:  Inspection: Unremarkable  Motion: Flexion 45 deg, Extension 25 deg, Side Bending to 45 deg bilaterally,  Rotation to 45 deg bilaterally  SLR laying: Negative XSLR laying: Negative  Palpable tenderness: mild tenderness over the greater trochanteric area bilaterally FABER: Positive on left  Sensory change: Gross sensation intact to all lumbar and sacral dermatomes.  Reflexes: 2+ at both patellar tendons, 2+ at achilles tendons, Babinski's downgoing.  Strength at foot  Plantar-flexion: 5/5 Dorsi-flexion: 5/5 Eversion: 5/5 Inversion: 5/5  Leg strength  Quad: 5/5 Hamstring: 5/5 Hip flexor: 5/5 Hip abductors: 4/5  No large tears from previous exam for any changes.  OMT Physical Exam   Cervical  C2 flexed rotated and side bent left C4 flexed rotated and side bent right Thoracic  T5 extended rotated and  side bent left  T9 extended rotated and side bent right  Lumbar  L2 flexed rotated and side bent left  Sacrum  Left on left  Illium  Right anterior ilium   Impression and Recommendations:

## 2015-12-02 NOTE — Assessment & Plan Note (Signed)
Decision today to treat with OMT was based on Physical Exam  After verbal consent patient was treated with HVLA, ME techniques in thoracic, lumbar, sacral and pelvic areas  Patient tolerated the procedure well with improvement in symptoms  Patient given exercises, stretches and lifestyle modifications  See medications in patient instructions if given  Patient will follow up in 4-6 weeks

## 2015-12-02 NOTE — Assessment & Plan Note (Signed)
Patient has been doing pretty much nothing. Been fairly noncompliant. Encourage her to do the exercises on a regular basis. Decline formal physical therapy. Encourage him to potentially lose weight. Has responded well to meloxicam and given refill. We discussed icing regimen. Proper shoes. Follow-up with me again in 4-6 weeks.

## 2015-12-02 NOTE — Assessment & Plan Note (Signed)
First metatarsal. We discussed lacing shoe differently as well as proper shoes. Patient is artery in custom orthotics.

## 2016-04-26 LAB — HM MAMMOGRAPHY

## 2016-06-02 ENCOUNTER — Ambulatory Visit (INDEPENDENT_AMBULATORY_CARE_PROVIDER_SITE_OTHER): Payer: BLUE CROSS/BLUE SHIELD | Admitting: Internal Medicine

## 2016-06-02 ENCOUNTER — Encounter: Payer: BLUE CROSS/BLUE SHIELD | Admitting: Internal Medicine

## 2016-06-02 ENCOUNTER — Encounter: Payer: Self-pay | Admitting: Internal Medicine

## 2016-06-02 VITALS — BP 108/78 | HR 99 | Temp 97.9°F | Resp 16 | Ht 63.0 in | Wt 191.0 lb

## 2016-06-02 DIAGNOSIS — K219 Gastro-esophageal reflux disease without esophagitis: Secondary | ICD-10-CM

## 2016-06-02 DIAGNOSIS — F329 Major depressive disorder, single episode, unspecified: Secondary | ICD-10-CM

## 2016-06-02 DIAGNOSIS — K589 Irritable bowel syndrome without diarrhea: Secondary | ICD-10-CM | POA: Diagnosis not present

## 2016-06-02 DIAGNOSIS — E782 Mixed hyperlipidemia: Secondary | ICD-10-CM

## 2016-06-02 DIAGNOSIS — Z0000 Encounter for general adult medical examination without abnormal findings: Secondary | ICD-10-CM | POA: Diagnosis not present

## 2016-06-02 DIAGNOSIS — F411 Generalized anxiety disorder: Secondary | ICD-10-CM

## 2016-06-02 DIAGNOSIS — F32A Depression, unspecified: Secondary | ICD-10-CM

## 2016-06-02 NOTE — Assessment & Plan Note (Signed)
Following with psych Management per psych Currently controlled

## 2016-06-02 NOTE — Assessment & Plan Note (Signed)
controlled 

## 2016-06-02 NOTE — Assessment & Plan Note (Signed)
GERD controlled Continue every other day medication - has not been able to cut down further

## 2016-06-02 NOTE — Progress Notes (Signed)
Pre visit review using our clinic review tool, if applicable. No additional management support is needed unless otherwise documented below in the visit note. 

## 2016-06-02 NOTE — Patient Instructions (Addendum)
All other Health Maintenance issues reviewed.   All recommended immunizations and age-appropriate screenings are up-to-date or discussed.  No immunizations administered today.   Medications reviewed and updated.  No changes recommended at this time.   Please followup in one year for a physical   Health Maintenance, Female Adopting a healthy lifestyle and getting preventive care can go a long way to promote health and wellness. Talk with your health care provider about what schedule of regular examinations is right for you. This is a good chance for you to check in with your provider about disease prevention and staying healthy. In between checkups, there are plenty of things you can do on your own. Experts have done a lot of research about which lifestyle changes and preventive measures are most likely to keep you healthy. Ask your health care provider for more information. WEIGHT AND DIET  Eat a healthy diet  Be sure to include plenty of vegetables, fruits, low-fat dairy products, and lean protein.  Do not eat a lot of foods high in solid fats, added sugars, or salt.  Get regular exercise. This is one of the most important things you can do for your health.  Most adults should exercise for at least 150 minutes each week. The exercise should increase your heart rate and make you sweat (moderate-intensity exercise).  Most adults should also do strengthening exercises at least twice a week. This is in addition to the moderate-intensity exercise.  Maintain a healthy weight  Body mass index (BMI) is a measurement that can be used to identify possible weight problems. It estimates body fat based on height and weight. Your health care provider can help determine your BMI and help you achieve or maintain a healthy weight.  For females 54 years of age and older:   A BMI below 18.5 is considered underweight.  A BMI of 18.5 to 24.9 is normal.  A BMI of 25 to 29.9 is considered  overweight.  A BMI of 30 and above is considered obese.  Watch levels of cholesterol and blood lipids  You should start having your blood tested for lipids and cholesterol at 48 years of age, then have this test every 5 years.  You may need to have your cholesterol levels checked more often if:  Your lipid or cholesterol levels are high.  You are older than 48 years of age.  You are at high risk for heart disease.  CANCER SCREENING   Lung Cancer  Lung cancer screening is recommended for adults 40-71 years old who are at high risk for lung cancer because of a history of smoking.  A yearly low-dose CT scan of the lungs is recommended for people who:  Currently smoke.  Have quit within the past 15 years.  Have at least a 30-pack-year history of smoking. A pack year is smoking an average of one pack of cigarettes a day for 1 year.  Yearly screening should continue until it has been 15 years since you quit.  Yearly screening should stop if you develop a health problem that would prevent you from having lung cancer treatment.  Breast Cancer  Practice breast self-awareness. This means understanding how your breasts normally appear and feel.  It also means doing regular breast self-exams. Let your health care provider know about any changes, no matter how small.  If you are in your 20s or 30s, you should have a clinical breast exam (CBE) by a health care provider every 1-3 years as  part of a regular health exam.  If you are 40 or older, have a CBE every year. Also consider having a breast X-ray (mammogram) every year.  If you have a family history of breast cancer, talk to your health care provider about genetic screening.  If you are at high risk for breast cancer, talk to your health care provider about having an MRI and a mammogram every year.  Breast cancer gene (BRCA) assessment is recommended for women who have family members with BRCA-related cancers. BRCA-related  cancers include:  Breast.  Ovarian.  Tubal.  Peritoneal cancers.  Results of the assessment will determine the need for genetic counseling and BRCA1 and BRCA2 testing. Cervical Cancer Your health care provider may recommend that you be screened regularly for cancer of the pelvic organs (ovaries, uterus, and vagina). This screening involves a pelvic examination, including checking for microscopic changes to the surface of your cervix (Pap test). You may be encouraged to have this screening done every 3 years, beginning at age 51.  For women ages 37-65, health care providers may recommend pelvic exams and Pap testing every 3 years, or they may recommend the Pap and pelvic exam, combined with testing for human papilloma virus (HPV), every 5 years. Some types of HPV increase your risk of cervical cancer. Testing for HPV may also be done on women of any age with unclear Pap test results.  Other health care providers may not recommend any screening for nonpregnant women who are considered low risk for pelvic cancer and who do not have symptoms. Ask your health care provider if a screening pelvic exam is right for you.  If you have had past treatment for cervical cancer or a condition that could lead to cancer, you need Pap tests and screening for cancer for at least 20 years after your treatment. If Pap tests have been discontinued, your risk factors (such as having a new sexual partner) need to be reassessed to determine if screening should resume. Some women have medical problems that increase the chance of getting cervical cancer. In these cases, your health care provider may recommend more frequent screening and Pap tests. Colorectal Cancer  This type of cancer can be detected and often prevented.  Routine colorectal cancer screening usually begins at 48 years of age and continues through 48 years of age.  Your health care provider may recommend screening at an earlier age if you have risk  factors for colon cancer.  Your health care provider may also recommend using home test kits to check for hidden blood in the stool.  A small camera at the end of a tube can be used to examine your colon directly (sigmoidoscopy or colonoscopy). This is done to check for the earliest forms of colorectal cancer.  Routine screening usually begins at age 58.  Direct examination of the colon should be repeated every 5-10 years through 48 years of age. However, you may need to be screened more often if early forms of precancerous polyps or small growths are found. Skin Cancer  Check your skin from head to toe regularly.  Tell your health care provider about any new moles or changes in moles, especially if there is a change in a mole's shape or color.  Also tell your health care provider if you have a mole that is larger than the size of a pencil eraser.  Always use sunscreen. Apply sunscreen liberally and repeatedly throughout the day.  Protect yourself by wearing long sleeves, pants,  a wide-brimmed hat, and sunglasses whenever you are outside. HEART DISEASE, DIABETES, AND HIGH BLOOD PRESSURE   High blood pressure causes heart disease and increases the risk of stroke. High blood pressure is more likely to develop in:  People who have blood pressure in the high end of the normal range (130-139/85-89 mm Hg).  People who are overweight or obese.  People who are African American.  If you are 17-61 years of age, have your blood pressure checked every 3-5 years. If you are 5 years of age or older, have your blood pressure checked every year. You should have your blood pressure measured twice--once when you are at a hospital or clinic, and once when you are not at a hospital or clinic. Record the average of the two measurements. To check your blood pressure when you are not at a hospital or clinic, you can use:  An automated blood pressure machine at a pharmacy.  A home blood pressure  monitor.  If you are between 44 years and 62 years old, ask your health care provider if you should take aspirin to prevent strokes.  Have regular diabetes screenings. This involves taking a blood sample to check your fasting blood sugar level.  If you are at a normal weight and have a low risk for diabetes, have this test once every three years after 48 years of age.  If you are overweight and have a high risk for diabetes, consider being tested at a younger age or more often. PREVENTING INFECTION  Hepatitis B  If you have a higher risk for hepatitis B, you should be screened for this virus. You are considered at high risk for hepatitis B if:  You were born in a country where hepatitis B is common. Ask your health care provider which countries are considered high risk.  Your parents were born in a high-risk country, and you have not been immunized against hepatitis B (hepatitis B vaccine).  You have HIV or AIDS.  You use needles to inject street drugs.  You live with someone who has hepatitis B.  You have had sex with someone who has hepatitis B.  You get hemodialysis treatment.  You take certain medicines for conditions, including cancer, organ transplantation, and autoimmune conditions. Hepatitis C  Blood testing is recommended for:  Everyone born from [birth year] through 1963.  Anyone with known risk factors for hepatitis C. Sexually transmitted infections (STIs)  You should be screened for sexually transmitted infections (STIs) including gonorrhea and chlamydia if:  You are sexually active and are younger than 48 years of age.  You are older than 48 years of age and your health care provider tells you that you are at risk for this type of infection.  Your sexual activity has changed since you were last screened and you are at an increased risk for chlamydia or gonorrhea. Ask your health care provider if you are at risk.  If you do not have HIV, but are at risk, it may be  recommended that you take a prescription medicine daily to prevent HIV infection. This is called pre-exposure prophylaxis (PrEP). You are considered at risk if:  You are sexually active and do not regularly use condoms or know the HIV status of your partner(s).  You take drugs by injection.  You are sexually active with a partner who has HIV. Talk with your health care provider about whether you are at high risk of being infected with HIV. If you choose to begin  PrEP, you should first be tested for HIV. You should then be tested every 3 months for as long as you are taking PrEP.  PREGNANCY   If you are premenopausal and you may become pregnant, ask your health care provider about preconception counseling.  If you may become pregnant, take 400 to 800 micrograms (mcg) of folic acid every day.  If you want to prevent pregnancy, talk to your health care provider about birth control (contraception). OSTEOPOROSIS AND MENOPAUSE   Osteoporosis is a disease in which the bones lose minerals and strength with aging. This can result in serious bone fractures. Your risk for osteoporosis can be identified using a bone density scan.  If you are 63 years of age or older, or if you are at risk for osteoporosis and fractures, ask your health care provider if you should be screened.  Ask your health care provider whether you should take a calcium or vitamin D supplement to lower your risk for osteoporosis.  Menopause may have certain physical symptoms and risks.  Hormone replacement therapy may reduce some of these symptoms and risks. Talk to your health care provider about whether hormone replacement therapy is right for you.  HOME CARE INSTRUCTIONS   Schedule regular health, dental, and eye exams.  Stay current with your immunizations.   Do not use any tobacco products including cigarettes, chewing tobacco, or electronic cigarettes.  If you are pregnant, do not drink alcohol.  If you are  breastfeeding, limit how much and how often you drink alcohol.  Limit alcohol intake to no more than 1 drink per day for nonpregnant women. One drink equals 12 ounces of beer, 5 ounces of wine, or 1 ounces of hard liquor.  Do not use street drugs.  Do not share needles.  Ask your health care provider for help if you need support or information about quitting drugs.  Tell your health care provider if you often feel depressed.  Tell your health care provider if you have ever been abused or do not feel safe at home.   This information is not intended to replace advice given to you by your health care provider. Make sure you discuss any questions you have with your health care provider.   Document Released: 02/08/2011 Document Revised: 08/16/2014 Document Reviewed: 06/27/2013 Elsevier Interactive Patient Education [employer].

## 2016-06-02 NOTE — Assessment & Plan Note (Signed)
Cholesterol has been well controlled Continue simvastatin 

## 2016-06-02 NOTE — Progress Notes (Signed)
Subjective:    Patient ID: VALEKA MASSAR, female    DOB: 1965/11/14, 50 y.o.   MRN: [id number]  HPI She is here for a physical exam.   For the past several weeks she has been seeing a Physiological scientist.  She sees him twice a week, walks and does zumba classes.    Hyperlipidemia: She is taking her medication daily. She is compliant with a low fat/cholesterol diet. She is exercising regularly. She denies myalgias.   GERD:  She is taking her medication every other day.  She denies any GERD symptoms and feels her GERD is well controlled.   Depression, anxiety, insomnia: She follows with psychiatry.    Medications and allergies reviewed with patient and updated if appropriate.  Patient Active Problem List   Diagnosis Date Noted  . GERD (gastroesophageal reflux disease) 09/02/2015  . IBS (irritable bowel syndrome) 05/01/2014  . Nonallopathic lesion of thoracic region 12/24/2013  . Greater trochanteric bursitis of left hip 04/30/2013  . Chronic low back pain 04/23/2013  . Sacroiliac joint dysfunction of left side 04/19/2013  . Greater trochanteric bursitis of right hip 04/11/2013  . Hip flexor tightness 04/11/2013  . Nonallopathic lesion of lumbosacral region 04/11/2013  . Nonallopathic lesion of pelvic region 04/11/2013  . Gait abnormality 05/09/2012  . Foot pain, right 09/21/2011  . CYSTIC ACNE 06/16/2010  . Depression 04/24/2010  . ALLERGIC RHINITIS CAUSE UNSPECIFIED 12/06/2008  . THROMBOCYTOSIS 05/07/2008  . 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 Mixed hyperlipidemia 04/22/2008  . INSOMNIA 04/22/2008  . Anxiety state 10/04/2007    Current Outpatient Prescriptions on File Prior to Visit  Medication Sig Dispense Refill  . augmented betamethasone dipropionate (DIPROLENE-AF) 0.05 % cream Apply topically 2 (two) times daily as needed. 150 g 1  . buPROPion (WELLBUTRIN SR) 100 MG 12 hr tablet Take 100 mg by mouth daily.    . cetirizine (ZYRTEC) 10 MG tablet Take 10 mg by  mouth as needed.      . ClonazePAM (KLONOPIN PO) Take 1 tablet by mouth 2 (two) times daily as needed.    . drospirenone-ethinyl estradiol (YAZ) 3-0.02 MG per tablet Take 1 tablet by mouth daily.      . fexofenadine (ALLEGRA) 180 MG tablet Take 180 mg by mouth as needed.    . Melatonin 3 MG TABS Take by mouth at bedtime as needed.     . meloxicam (MOBIC) 15 MG tablet Take 1 tablet (15 mg total) by mouth daily. (Patient taking differently: Take 15 mg by mouth as needed. ) 90 tablet 1  . omeprazole (PRILOSEC) 20 MG capsule Take 1 capsule (20 mg total) by mouth every other day. 30 capsule 3  . Phenylephrine HCl 5 MG TABS Take by mouth as needed.      . simvastatin (ZOCOR) 20 MG tablet Take 1 tablet (20 mg total) by mouth at bedtime. 90 tablet 3  . Vortioxetine HBr (TRINTELLIX) 20 MG TABS Take 20 mg by mouth daily. 30 tablet    No current facility-administered medications on file prior to visit.     Past Medical History:  Diagnosis Date  . ALLERGIC RHINITIS CAUSE UNSPECIFIED   . ANXIETY DISORDER   . CYSTIC ACNE   . DEPRESSION   . DYSPLASTIC NEVUS, CHEST   . ELEVATED BP READING WITHOUT DX HYPERTENSION   . INSOMNIA   . Mixed hyperlipidemia   . THROMBOCYTOSIS     Past Surgical History:  Procedure Laterality Date  . BREAST SURGERY  2000   Breast reduction  . Deviated septum repair  1997    Social History   Social History  . Marital status: Married    Spouse name: N/A  . Number of children: N/A  . Years of education: N/A   Social History Main Topics  . Smoking status: Never Smoker  . Smokeless tobacco: Never Used  . Alcohol use No  . Drug use: No  . Sexual activity: Not Asked   Other Topics Concern  . None   Social History Narrative   Married lives with spouse and two kids. She is originally from [place] area-moved to [place] 2004    Family History  Problem Relation Age of Onset  . Hyperlipidemia Father   . Hypertension Father   . Depression Sister   . Coronary artery  disease Paternal Grandfather     Review of Systems  Constitutional: Negative for appetite change, chills and fever.  HENT: Positive for congestion, postnasal drip, rhinorrhea, sinus pressure (very mild) and sore throat. Negative for ear pain.   Eyes: Negative for visual disturbance.  Respiratory: Negative for cough, shortness of breath and wheezing.   Cardiovascular: Negative for chest pain, palpitations and leg swelling.  Gastrointestinal: Negative for abdominal pain, blood in stool, constipation, diarrhea and nausea.  Endocrine: Negative for polydipsia and polyuria.  Genitourinary: Negative for dysuria and hematuria.  Musculoskeletal: Positive for arthralgias (right hip) and back pain (discomfort depending on activity).  Skin: Negative for color change and rash.  Neurological: Negative for dizziness, light-headedness and headaches.  Psychiatric/Behavioral: Positive for dysphoric mood and sleep disturbance. The patient is nervous/anxious (controlled).        Objective:   Vitals:   06/02/16 1030  BP: 108/78  Pulse: 99  Resp: 16  Temp: 97.9 F (36.6 C)   Filed Weights   06/02/16 1030  Weight: 191 lb (86.6 kg)   Body mass index is 33.83 kg/m.   Physical Exam Constitutional: She appears well-developed and well-nourished. No distress.  HENT:  Head: Normocephalic and atraumatic.  Right Ear: External ear normal. Normal ear canal and TM Left Ear: External ear normal.  Normal ear canal and TM Mouth/Throat: Oropharynx is clear and moist.  Eyes: Conjunctivae and EOM are normal.  Neck: Neck supple. No tracheal deviation present. No thyromegaly present.  No carotid bruit  Cardiovascular: Normal rate, regular rhythm and normal heart sounds.   No murmur heard.  No edema. Pulmonary/Chest: Effort normal and breath sounds normal. No respiratory distress. She has no wheezes. She has no rales.  Breast: deferred to Gyn Abdominal: Soft. She exhibits no distension. There is no tenderness.   Lymphadenopathy: She has no cervical adenopathy.  Skin: Skin is warm and dry. She is not diaphoretic.  Psychiatric: She has a normal mood and affect. Her behavior is normal.       Assessment & Plan:   Physical exam: Screening blood work   ordered Immunizations   Up to date  Mammogram   Up to date  Gyn    Up to date  Eye exams:  Up to date  Exercise - regular Weight - working on weight loss Skin  - non concerns today - will see derm Substance abuse - none  See Problem List for Assessment and Plan of chronic medical problems.   F/u annually

## 2016-07-28 ENCOUNTER — Other Ambulatory Visit: Payer: Self-pay | Admitting: Emergency Medicine

## 2016-07-28 MED ORDER — SIMVASTATIN 20 MG PO TABS: 20.0000 mg | ORAL_TABLET | Freq: Every day | ORAL | 2 refills | Status: DC

## 2017-01-14 ENCOUNTER — Ambulatory Visit (INDEPENDENT_AMBULATORY_CARE_PROVIDER_SITE_OTHER): Payer: BLUE CROSS/BLUE SHIELD | Admitting: Internal Medicine

## 2017-01-14 ENCOUNTER — Ambulatory Visit: Payer: BLUE CROSS/BLUE SHIELD | Admitting: Nurse Practitioner

## 2017-01-14 ENCOUNTER — Encounter: Payer: Self-pay | Admitting: Internal Medicine

## 2017-01-14 VITALS — BP 132/84 | HR 97 | Temp 98.2°F | Resp 16 | Wt 193.0 lb

## 2017-01-14 DIAGNOSIS — J301 Allergic rhinitis due to pollen: Secondary | ICD-10-CM | POA: Diagnosis not present

## 2017-01-14 MED ORDER — OLOPATADINE HCL 0.1 % OP SOLN: 1.0000 [drp] | Freq: Two times a day (BID) | OPHTHALMIC | 12 refills | Status: DC

## 2017-01-14 MED ORDER — MONTELUKAST SODIUM 10 MG PO TABS: 10.0000 mg | ORAL_TABLET | Freq: Every day | ORAL | 3 refills | Status: DC

## 2017-01-14 NOTE — Patient Instructions (Signed)
   Medications reviewed and updated.  Changes include starting xyzal at night, singulair at night and eye drops as needed for your allergies.  Continue allegra during the day.   Your prescription(s) have been submitted to your pharmacy. Please take as directed and contact our office if you believe you are having problem(s) with the medication(s).

## 2017-01-14 NOTE — Assessment & Plan Note (Signed)
Allergies are severe enough that they are affecting her quality of life Continue Allegra in the morning Can try Xyzal at night Start Singulair at night Unable to take a steroid nasal spray, continue neti pot Prescription allergic eyedrop She will set up an appointment with an allergist

## 2017-01-14 NOTE — Progress Notes (Signed)
Subjective:    Patient ID: Jessica [name], female    DOB: Nov 30, 1965, 51 y.o.   MRN: [id number]  HPI She is here for an acute visit.   Her allergies are persistent.  Usually at this time of year her allergies are getting better, but they are not.  She has a runny nose and a sore throat.  Her eyes are very itchy and irritated.  She has been having allergy attacks.  She heard about singulair. She takes allegra or zyrtec.  She can not use a nasal spray - it causes her to throw up.  She does  The neti pot.    Her bursitis flared up from the allergies and whole body inflammation.   Medications and allergies reviewed with patient and updated if appropriate.  Patient Active Problem List   Diagnosis Date Noted  . GERD (gastroesophageal reflux disease) 09/02/2015  . IBS (irritable bowel syndrome) 05/01/2014  . Nonallopathic lesion of thoracic region 12/24/2013  . Greater trochanteric bursitis of left hip 04/30/2013  . Chronic low back pain 04/23/2013  . Sacroiliac joint dysfunction of left side 04/19/2013  . Greater trochanteric bursitis of right hip 04/11/2013  . Hip flexor tightness 04/11/2013  . Nonallopathic lesion of lumbosacral region 04/11/2013  . Nonallopathic lesion of pelvic region 04/11/2013  . Gait abnormality 05/09/2012  . Foot pain, right 09/21/2011  . CYSTIC ACNE 06/16/2010  . Depression 04/24/2010  . ALLERGIC RHINITIS CAUSE UNSPECIFIED 12/06/2008  . THROMBOCYTOSIS 05/07/2008  . 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 Mixed hyperlipidemia 04/22/2008  . INSOMNIA 04/22/2008  . Anxiety state 10/04/2007    Current Outpatient Prescriptions on File Prior to Visit  Medication Sig Dispense Refill  . augmented betamethasone dipropionate (DIPROLENE-AF) 0.05 % cream Apply topically 2 (two) times daily as needed. 150 g 1  . buPROPion (WELLBUTRIN SR) 100 MG 12 hr tablet Take 100 mg by mouth daily.    . cetirizine (ZYRTEC) 10 MG tablet Take 10 mg by mouth as needed.      .  ClonazePAM (KLONOPIN PO) Take 1 tablet by mouth 2 (two) times daily as needed.    . drospirenone-ethinyl estradiol (YAZ) 3-0.02 MG per tablet Take 1 tablet by mouth daily.      . fexofenadine (ALLEGRA) 180 MG tablet Take 180 mg by mouth as needed.    . Melatonin 3 MG TABS Take by mouth at bedtime as needed.     . meloxicam (MOBIC) 15 MG tablet Take 1 tablet (15 mg total) by mouth daily. (Patient taking differently: Take 15 mg by mouth as needed. ) 90 tablet 1  . omeprazole (PRILOSEC) 20 MG capsule Take 1 capsule (20 mg total) by mouth every other day. 30 capsule 3  . Phenylephrine HCl 5 MG TABS Take by mouth as needed.      . simvastatin (ZOCOR) 20 MG tablet Take 1 tablet (20 mg total) by mouth at bedtime. 90 tablet 2  . Vortioxetine HBr (TRINTELLIX) 20 MG TABS Take 20 mg by mouth daily. 30 tablet    No current facility-administered medications on file prior to visit.     Past Medical History:  Diagnosis Date  . ALLERGIC RHINITIS CAUSE UNSPECIFIED   . ANXIETY DISORDER   . CYSTIC ACNE   . DEPRESSION   . DYSPLASTIC NEVUS, CHEST   . ELEVATED BP READING WITHOUT DX HYPERTENSION   . INSOMNIA   . Mixed hyperlipidemia   . THROMBOCYTOSIS     Past Surgical History:  Procedure  Laterality Date  . BREAST SURGERY  2000   Breast reduction  . Deviated septum repair  1997    Social History   Social History  . Marital status: Married    Spouse name: paul  . Number of children: N/A  . Years of education: N/A   Social History Main Topics  . Smoking status: Never Smoker  . Smokeless tobacco: Never Used  . Alcohol use No  . Drug use: No  . Sexual activity: Not on file   Other Topics Concern  . Not on file   Social History Narrative   Married lives with spouse and two kids. She is originally from [place] area-moved to [place] 2004    Family History  Problem Relation Age of Onset  . Hyperlipidemia Father   . Hypertension Father   . Depression Sister   . Coronary artery disease Paternal  Grandfather     Review of Systems  HENT: Positive for congestion, postnasal drip and rhinorrhea. Negative for sore throat (itchy throat).   Eyes: Positive for itching.  Respiratory: Negative for cough, shortness of breath and wheezing.        Objective:   Vitals:   01/14/17 1442  BP: 132/84  Pulse: 97  Resp: 16  Temp: 98.2 F (36.8 C)   Filed Weights   01/14/17 1442  Weight: 193 lb (87.5 kg)   Body mass index is 34.19 kg/m.  Wt Readings from Last 3 Encounters:  01/14/17 193 lb (87.5 kg)  06/02/16 191 lb (86.6 kg)  12/02/15 195 lb (88.5 kg)     Physical Exam GENERAL APPEARANCE: Appears stated age, well appearing, NAD EYES: conjunctiva clear, no icterus HEENT: bilateral tympanic membranes and ear canals normal, oropharynx with no erythema, no thyromegaly, trachea midline, no cervical or supraclavicular lymphadenopathy LUNGS: Clear to auscultation without wheeze or crackles, unlabored breathing, good air entry bilaterally HEART: Normal S1,S2 without murmurs EXTREMITIES: Without clubbing, cyanosis, or edema        Assessment & Plan:   See Problem List for Assessment and Plan of chronic medical problems.

## 2017-01-21 ENCOUNTER — Other Ambulatory Visit: Payer: Self-pay | Admitting: *Deleted

## 2017-01-21 NOTE — Telephone Encounter (Signed)
Left msg on triage stating she is current out of town wanting to get a refill on meloxicam & prednisone rx by Dr. Tamala Julian called into [facility] @ [phone]

## 2017-01-24 MED ORDER — MELOXICAM 15 MG PO TABS: 15.0000 mg | ORAL_TABLET | Freq: Every day | ORAL | 1 refills | Status: DC

## 2017-01-24 MED ORDER — PREDNISONE 50 MG PO TABS: 50.0000 mg | ORAL_TABLET | Freq: Every day | ORAL | 0 refills | Status: DC

## 2017-01-24 NOTE — Telephone Encounter (Signed)
rx sent into pharmacy

## 2017-04-21 ENCOUNTER — Telehealth: Payer: Self-pay | Admitting: Internal Medicine

## 2017-04-21 MED ORDER — MONTELUKAST SODIUM 10 MG PO TABS: 10.0000 mg | ORAL_TABLET | Freq: Every day | ORAL | 0 refills | Status: DC

## 2017-04-21 MED ORDER — OLOPATADINE HCL 0.1 % OP SOLN: 1.0000 [drp] | Freq: Two times a day (BID) | OPHTHALMIC | 0 refills | Status: DC

## 2017-04-21 NOTE — Telephone Encounter (Signed)
Called pt no answer LMOM rx sent to mail service will need annual appt in Oct./lbm

## 2017-04-21 NOTE — Telephone Encounter (Signed)
Pt needs a refill of her  olopatadine (PATANOL) 0.1 % ophthalmic solution And  montelukast (SINGULAIR) 10 MG tablet Would like them sent to [facility] Please advise

## 2017-06-02 DIAGNOSIS — E669 Obesity, unspecified: Secondary | ICD-10-CM | POA: Insufficient documentation

## 2017-06-02 NOTE — Patient Instructions (Addendum)
Test(s) ordered today. Your results will be released to [place] (or called to you) after review, usually within 72hours after test completion. If any changes need to be made, you will be notified at that same time.  All other Health Maintenance issues reviewed.   All recommended immunizations and age-appropriate screenings are up-to-date or discussed.  No immunizations administered today.   Medications reviewed and updated.  Changes include trying a steroid shampoo for your scalp.  Your prescription(s) have been submitted to your pharmacy. Please take as directed and contact our office if you believe you are having problem(s) with the medication(s).  Please followup in yearly   Health Maintenance, Female Adopting a healthy lifestyle and getting preventive care can go a long way to promote health and wellness. Talk with your health care provider about what schedule of regular examinations is right for you. This is a good chance for you to check in with your provider about disease prevention and staying healthy. In between checkups, there are plenty of things you can do on your own. Experts have done a lot of research about which lifestyle changes and preventive measures are most likely to keep you healthy. Ask your health care provider for more information. Weight and diet Eat a healthy diet  Be sure to include plenty of vegetables, fruits, low-fat dairy products, and lean protein.  Do not eat a lot of foods high in solid fats, added sugars, or salt.  Get regular exercise. This is one of the most important things you can do for your health. ? Most adults should exercise for at least 150 minutes each week. The exercise should increase your heart rate and make you sweat (moderate-intensity exercise). ? Most adults should also do strengthening exercises at least twice a week. This is in addition to the moderate-intensity exercise.  Maintain a healthy weight  Body mass index (BMI) is a  measurement that can be used to identify possible weight problems. It estimates body fat based on height and weight. Your health care provider can help determine your BMI and help you achieve or maintain a healthy weight.  For females 23 years of age and older: ? A BMI below 18.5 is considered underweight. ? A BMI of 18.5 to 24.9 is normal. ? A BMI of 25 to 29.9 is considered overweight. ? A BMI of 30 and above is considered obese.  Watch levels of cholesterol and blood lipids  You should start having your blood tested for lipids and cholesterol at 49 years of age, then have this test every 5 years.  You may need to have your cholesterol levels checked more often if: ? Your lipid or cholesterol levels are high. ? You are older than 49 years of age. ? You are at high risk for heart disease.  Cancer screening Lung Cancer  Lung cancer screening is recommended for adults 100-53 years old who are at high risk for lung cancer because of a history of smoking.  A yearly low-dose CT scan of the lungs is recommended for people who: ? Currently smoke. ? Have quit within the past 15 years. ? Have at least a 30-pack-year history of smoking. A pack year is smoking an average of one pack of cigarettes a day for 1 year.  Yearly screening should continue until it has been 15 years since you quit.  Yearly screening should stop if you develop a health problem that would prevent you from having lung cancer treatment.  Breast Cancer  Practice breast  self-awareness. This means understanding how your breasts normally appear and feel.  It also means doing regular breast self-exams. Let your health care provider know about any changes, no matter how small.  If you are in your 20s or 30s, you should have a clinical breast exam (CBE) by a health care provider every 1-3 years as part of a regular health exam.  If you are 53 or older, have a CBE every year. Also consider having a breast X-ray (mammogram)  every year.  If you have a family history of breast cancer, talk to your health care provider about genetic screening.  If you are at high risk for breast cancer, talk to your health care provider about having an MRI and a mammogram every year.  Breast cancer gene (BRCA) assessment is recommended for women who have family members with BRCA-related cancers. BRCA-related cancers include: ? Breast. ? Ovarian. ? Tubal. ? Peritoneal cancers.  Results of the assessment will determine the need for genetic counseling and BRCA1 and BRCA2 testing.  Cervical Cancer Your health care provider may recommend that you be screened regularly for cancer of the pelvic organs (ovaries, uterus, and vagina). This screening involves a pelvic examination, including checking for microscopic changes to the surface of your cervix (Pap test). You may be encouraged to have this screening done every 3 years, beginning at age 61.  For women ages 4-65, health care providers may recommend pelvic exams and Pap testing every 3 years, or they may recommend the Pap and pelvic exam, combined with testing for human papilloma virus (HPV), every 5 years. Some types of HPV increase your risk of cervical cancer. Testing for HPV may also be done on women of any age with unclear Pap test results.  Other health care providers may not recommend any screening for nonpregnant women who are considered low risk for pelvic cancer and who do not have symptoms. Ask your health care provider if a screening pelvic exam is right for you.  If you have had past treatment for cervical cancer or a condition that could lead to cancer, you need Pap tests and screening for cancer for at least 20 years after your treatment. If Pap tests have been discontinued, your risk factors (such as having a new sexual partner) need to be reassessed to determine if screening should resume. Some women have medical problems that increase the chance of getting cervical  cancer. In these cases, your health care provider may recommend more frequent screening and Pap tests.  Colorectal Cancer  This type of cancer can be detected and often prevented.  Routine colorectal cancer screening usually begins at 49 years of age and continues through 49 years of age.  Your health care provider may recommend screening at an earlier age if you have risk factors for colon cancer.  Your health care provider may also recommend using home test kits to check for hidden blood in the stool.  A small camera at the end of a tube can be used to examine your colon directly (sigmoidoscopy or colonoscopy). This is done to check for the earliest forms of colorectal cancer.  Routine screening usually begins at age 56.  Direct examination of the colon should be repeated every 5-10 years through 49 years of age. However, you may need to be screened more often if early forms of precancerous polyps or small growths are found.  Skin Cancer  Check your skin from head to toe regularly.  Tell your health care provider about  any new moles or changes in moles, especially if there is a change in a mole's shape or color.  Also tell your health care provider if you have a mole that is larger than the size of a pencil eraser.  Always use sunscreen. Apply sunscreen liberally and repeatedly throughout the day.  Protect yourself by wearing long sleeves, pants, a wide-brimmed hat, and sunglasses whenever you are outside.  Heart disease, diabetes, and high blood pressure  High blood pressure causes heart disease and increases the risk of stroke. High blood pressure is more likely to develop in: ? People who have blood pressure in the high end of the normal range (130-139/85-89 mm Hg). ? People who are overweight or obese. ? People who are African American.  If you are 56-30 years of age, have your blood pressure checked every 3-5 years. If you are 58 years of age or older, have your blood  pressure checked every year. You should have your blood pressure measured twice-once when you are at a hospital or clinic, and once when you are not at a hospital or clinic. Record the average of the two measurements. To check your blood pressure when you are not at a hospital or clinic, you can use: ? An automated blood pressure machine at a pharmacy. ? A home blood pressure monitor.  If you are between 53 years and 22 years old, ask your health care provider if you should take aspirin to prevent strokes.  Have regular diabetes screenings. This involves taking a blood sample to check your fasting blood sugar level. ? If you are at a normal weight and have a low risk for diabetes, have this test once every three years after 49 years of age. ? If you are overweight and have a high risk for diabetes, consider being tested at a younger age or more often. Preventing infection Hepatitis B  If you have a higher risk for hepatitis B, you should be screened for this virus. You are considered at high risk for hepatitis B if: ? You were born in a country where hepatitis B is common. Ask your health care provider which countries are considered high risk. ? Your parents were born in a high-risk country, and you have not been immunized against hepatitis B (hepatitis B vaccine). ? You have HIV or AIDS. ? You use needles to inject street drugs. ? You live with someone who has hepatitis B. ? You have had sex with someone who has hepatitis B. ? You get hemodialysis treatment. ? You take certain medicines for conditions, including cancer, organ transplantation, and autoimmune conditions.  Hepatitis C  Blood testing is recommended for: ? Everyone born from [birth year] through 1963. ? Anyone with known risk factors for hepatitis C.  Sexually transmitted infections (STIs)  You should be screened for sexually transmitted infections (STIs) including gonorrhea and chlamydia if: ? You are sexually active and are  younger than 49 years of age. ? You are older than 49 years of age and your health care provider tells you that you are at risk for this type of infection. ? Your sexual activity has changed since you were last screened and you are at an increased risk for chlamydia or gonorrhea. Ask your health care provider if you are at risk.  If you do not have HIV, but are at risk, it may be recommended that you take a prescription medicine daily to prevent HIV infection. This is called pre-exposure prophylaxis (PrEP). You are considered  at risk if: ? You are sexually active and do not regularly use condoms or know the HIV status of your partner(s). ? You take drugs by injection. ? You are sexually active with a partner who has HIV.  Talk with your health care provider about whether you are at high risk of being infected with HIV. If you choose to begin PrEP, you should first be tested for HIV. You should then be tested every 3 months for as long as you are taking PrEP. Pregnancy  If you are premenopausal and you may become pregnant, ask your health care provider about preconception counseling.  If you may become pregnant, take 400 to 800 micrograms (mcg) of folic acid every day.  If you want to prevent pregnancy, talk to your health care provider about birth control (contraception). Osteoporosis and menopause  Osteoporosis is a disease in which the bones lose minerals and strength with aging. This can result in serious bone fractures. Your risk for osteoporosis can be identified using a bone density scan.  If you are 66 years of age or older, or if you are at risk for osteoporosis and fractures, ask your health care provider if you should be screened.  Ask your health care provider whether you should take a calcium or vitamin D supplement to lower your risk for osteoporosis.  Menopause may have certain physical symptoms and risks.  Hormone replacement therapy may reduce some of these symptoms and  risks. Talk to your health care provider about whether hormone replacement therapy is right for you. Follow these instructions at home:  Schedule regular health, dental, and eye exams.  Stay current with your immunizations.  Do not use any tobacco products including cigarettes, chewing tobacco, or electronic cigarettes.  If you are pregnant, do not drink alcohol.  If you are breastfeeding, limit how much and how often you drink alcohol.  Limit alcohol intake to no more than 1 drink per day for nonpregnant women. One drink equals 12 ounces of beer, 5 ounces of wine, or 1 ounces of hard liquor.  Do not use street drugs.  Do not share needles.  Ask your health care provider for help if you need support or information about quitting drugs.  Tell your health care provider if you often feel depressed.  Tell your health care provider if you have ever been abused or do not feel safe at home. This information is not intended to replace advice given to you by your health care provider. Make sure you discuss any questions you have with your health care provider. Document Released: 02/08/2011 Document Revised: 01/01/2016 Document Reviewed: 04/29/2015 Elsevier Interactive Patient Education  [employer].

## 2017-06-02 NOTE — Progress Notes (Signed)
Subjective:    Patient ID: Jessica [name], female    DOB: [birth year], 51 y.o.   MRN: [id number]  HPI She is here for a physical exam.   She has been getting eczema or dry patches in her scalp.  It was itchy and worse over the summer.  She tried several otc products.  She self diagnosed herself with seborrheic dermatitis.  A couple of the products seemed to help.   Medications and allergies reviewed with patient and updated if appropriate.  Patient Active Problem List   Diagnosis Date Noted  . Obesity with body mass index greater than 30 06/02/2017  . GERD (gastroesophageal reflux disease) 09/02/2015  . IBS (irritable bowel syndrome) 05/01/2014  . Nonallopathic lesion of thoracic region 12/24/2013  . Greater trochanteric bursitis of left hip 04/30/2013  . Chronic low back pain 04/23/2013  . Sacroiliac joint dysfunction of left side 04/19/2013  . Greater trochanteric bursitis of right hip 04/11/2013  . Hip flexor tightness 04/11/2013  . Nonallopathic lesion of lumbosacral region 04/11/2013  . Nonallopathic lesion of pelvic region 04/11/2013  . Gait abnormality 05/09/2012  . CYSTIC ACNE 06/16/2010  . Depression 04/24/2010  . Allergic rhinitis 12/06/2008  . Thrombocytosis ([place]) 05/07/2008  . 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 Mixed hyperlipidemia 04/22/2008  . INSOMNIA 04/22/2008  . Anxiety state 10/04/2007    Current Outpatient Prescriptions on File Prior to Visit  Medication Sig Dispense Refill  . augmented betamethasone dipropionate (DIPROLENE-AF) 0.05 % cream Apply topically 2 (two) times daily as needed. 150 g 1  . buPROPion (WELLBUTRIN SR) 100 MG 12 hr tablet Take 100 mg by mouth daily.    . cetirizine (ZYRTEC) 10 MG tablet Take 10 mg by mouth as needed.      . ClonazePAM (KLONOPIN PO) Take 1 tablet by mouth 2 (two) times daily as needed.    . drospirenone-ethinyl estradiol (YAZ) 3-0.02 MG per tablet Take 1 tablet by mouth daily.      . fexofenadine (ALLEGRA)  180 MG tablet Take 180 mg by mouth as needed.    . Melatonin 3 MG TABS Take by mouth at bedtime as needed.     . meloxicam (MOBIC) 15 MG tablet Take 1 tablet (15 mg total) by mouth daily. 90 tablet 1  . montelukast (SINGULAIR) 10 MG tablet Take 1 tablet (10 mg total) by mouth at bedtime. Annual appt due in Oct must see provider for future refills 90 tablet 0  . olopatadine (PATANOL) 0.1 % ophthalmic solution Place 1 drop into both eyes 2 (two) times daily. Annual appt due in Oct must see provider for future refills 15 mL 0  . omeprazole (PRILOSEC) 20 MG capsule Take 1 capsule (20 mg total) by mouth every other day. 30 capsule 3  . Phenylephrine HCl 5 MG TABS Take by mouth as needed.      . simvastatin (ZOCOR) 20 MG tablet Take 1 tablet (20 mg total) by mouth at bedtime. 90 tablet 2  . Vortioxetine HBr (TRINTELLIX) 20 MG TABS Take 20 mg by mouth daily. 30 tablet    No current facility-administered medications on file prior to visit.     Past Medical History:  Diagnosis Date  . ALLERGIC RHINITIS CAUSE UNSPECIFIED   . ANXIETY DISORDER   . CYSTIC ACNE   . DEPRESSION   . DYSPLASTIC NEVUS, CHEST   . ELEVATED BP READING WITHOUT DX HYPERTENSION   . INSOMNIA   . Mixed hyperlipidemia   . THROMBOCYTOSIS  Past Surgical History:  Procedure Laterality Date  . BREAST SURGERY  2000   Breast reduction  . Deviated septum repair  1997    Social History   Social History  . Marital status: Married    Spouse name: paul  . Number of children: N/A  . Years of education: N/A   Social History Main Topics  . Smoking status: Never Smoker  . Smokeless tobacco: Never Used  . Alcohol use No  . Drug use: No  . Sexual activity: Not Asked   Other Topics Concern  . None   Social History Narrative   Married lives with spouse and two kids. She is originally from [place] area-moved to [place] 2004    Family History  Problem Relation Age of Onset  . Hyperlipidemia Father   . Hypertension Father   .  Depression Sister   . Coronary artery disease Paternal Grandfather     Review of Systems  Constitutional: Negative for chills and fever.  Eyes: Negative for visual disturbance.  Respiratory: Negative for cough, shortness of breath and wheezing.   Cardiovascular: Negative for chest pain, palpitations and leg swelling.  Gastrointestinal: Negative for abdominal pain, blood in stool, constipation, diarrhea and nausea.       GERD occ   Genitourinary: Negative for dysuria and hematuria.  Musculoskeletal: Positive for arthralgias (hip ).  Skin:       Scalp, eczema  Neurological: Positive for headaches (allergy related). Negative for dizziness and light-headedness.  Psychiatric/Behavioral: Positive for dysphoric mood (controlled) and sleep disturbance (does not sleep enough and not best quality but sufficient). The patient is nervous/anxious (controlled).        Objective:   Vitals:   06/03/17 0925  BP: 104/74  Pulse: (!) 116  Resp: 16  Temp: 98.6 F (37 C)  SpO2: 98%   Filed Weights   06/03/17 0925  Weight: 194 lb (88 kg)   Body mass index is 34.37 kg/m.  Wt Readings from Last 3 Encounters:  06/03/17 194 lb (88 kg)  01/14/17 193 lb (87.5 kg)  06/02/16 191 lb (86.6 kg)     Physical Exam Constitutional: She appears well-developed and well-nourished. No distress.  HENT:  Head: Normocephalic and atraumatic.  Right Ear: External ear normal. Normal ear canal and TM Left Ear: External ear normal.  Normal ear canal and TM Mouth/Throat: Oropharynx is clear and moist.  Eyes: Conjunctivae and EOM are normal.  Neck: Neck supple. No tracheal deviation present. No thyromegaly present.  No carotid bruit  Cardiovascular: Normal rate, regular rhythm and normal heart sounds.   No murmur heard.  No edema. Pulmonary/Chest: Effort normal and breath sounds normal. No respiratory distress. She has no wheezes. She has no rales.  Breast: deferred to Gyn Abdominal: Soft. She exhibits no  distension. There is no tenderness.  Lymphadenopathy: She has no cervical adenopathy.  Skin: Skin is warm and dry. She is not diaphoretic.  Psychiatric: She has a normal mood and affect. Her behavior is normal.        Assessment & Plan:   Physical exam: Screening blood work  ordered Immunizations  Td up to date, flu vaccine  Mammogram Up to date - will due next month Gyn  Up to date  - has appt schedule Eye exams  Up to date  Exercise   Walking, zumba - at least 3/week Weight  Advised on weight loss Skin  Dry and itchy patches on scalp Substance abuse   none  See Problem List  for Assessment and Plan of chronic medical problems.   FU in one year

## 2017-06-03 ENCOUNTER — Encounter: Payer: Self-pay | Admitting: Internal Medicine

## 2017-06-03 ENCOUNTER — Ambulatory Visit (INDEPENDENT_AMBULATORY_CARE_PROVIDER_SITE_OTHER): Payer: BLUE CROSS/BLUE SHIELD | Admitting: Internal Medicine

## 2017-06-03 VITALS — BP 104/74 | HR 116 | Temp 98.6°F | Resp 16 | Ht 63.0 in | Wt 194.0 lb

## 2017-06-03 DIAGNOSIS — E782 Mixed hyperlipidemia: Secondary | ICD-10-CM | POA: Diagnosis not present

## 2017-06-03 DIAGNOSIS — D75839 Thrombocytosis, unspecified: Secondary | ICD-10-CM

## 2017-06-03 DIAGNOSIS — F329 Major depressive disorder, single episode, unspecified: Secondary | ICD-10-CM | POA: Diagnosis not present

## 2017-06-03 DIAGNOSIS — K589 Irritable bowel syndrome without diarrhea: Secondary | ICD-10-CM | POA: Diagnosis not present

## 2017-06-03 DIAGNOSIS — K219 Gastro-esophageal reflux disease without esophagitis: Secondary | ICD-10-CM

## 2017-06-03 DIAGNOSIS — Z0000 Encounter for general adult medical examination without abnormal findings: Secondary | ICD-10-CM

## 2017-06-03 DIAGNOSIS — D473 Essential (hemorrhagic) thrombocythemia: Secondary | ICD-10-CM

## 2017-06-03 DIAGNOSIS — J301 Allergic rhinitis due to pollen: Secondary | ICD-10-CM

## 2017-06-03 DIAGNOSIS — F32A Depression, unspecified: Secondary | ICD-10-CM

## 2017-06-03 DIAGNOSIS — F411 Generalized anxiety disorder: Secondary | ICD-10-CM | POA: Diagnosis not present

## 2017-06-03 DIAGNOSIS — L219 Seborrheic dermatitis, unspecified: Secondary | ICD-10-CM

## 2017-06-03 MED ORDER — OMEPRAZOLE 20 MG PO CPDR: 20.0000 mg | DELAYED_RELEASE_CAPSULE | ORAL | 3 refills | Status: DC

## 2017-06-03 MED ORDER — MONTELUKAST SODIUM 10 MG PO TABS: 10.0000 mg | ORAL_TABLET | Freq: Every day | ORAL | 3 refills | Status: DC

## 2017-06-03 MED ORDER — OLOPATADINE HCL 0.1 % OP SOLN: 1.0000 [drp] | Freq: Two times a day (BID) | OPHTHALMIC | 1 refills | Status: DC

## 2017-06-03 MED ORDER — SIMVASTATIN 20 MG PO TABS: 20.0000 mg | ORAL_TABLET | Freq: Every day | ORAL | 3 refills | Status: DC

## 2017-06-03 MED ORDER — CICLOPIROX 1 % EX SHAM: MEDICATED_SHAMPOO | CUTANEOUS | 8 refills | Status: DC

## 2017-06-03 NOTE — Assessment & Plan Note (Addendum)
Check lipid panel  Continue daily statin Regular exercise and healthy diet encouraged  

## 2017-06-03 NOTE — Assessment & Plan Note (Signed)
Takes medication 5/7 days Overall controlled Compliant with GERD diet

## 2017-06-03 NOTE — Assessment & Plan Note (Signed)
Controlled with anxiety / stress controlled

## 2017-06-03 NOTE — Assessment & Plan Note (Signed)
Check cbc 

## 2017-06-03 NOTE — Assessment & Plan Note (Signed)
Management per psychiatrist - management per them Seeing a therapist

## 2017-06-03 NOTE — Assessment & Plan Note (Signed)
Overall controlled Continue current medication 

## 2017-06-13 LAB — HM MAMMOGRAPHY

## 2017-06-15 ENCOUNTER — Encounter: Payer: Self-pay | Admitting: Internal Medicine

## 2017-06-17 ENCOUNTER — Telehealth: Payer: Self-pay | Admitting: Internal Medicine

## 2017-06-17 NOTE — Telephone Encounter (Signed)
Patient Name: Jessica [name]  DOB: January 24, 1966    Initial Comment Caller states having vertigo in the mornings past couple of mornings;    Nurse Assessment  Nurse: Verlin Fester RN, Stanton Kidney Date/Time (Eastern Time): 06/17/2017 9:35:26 AM  Confirm and document reason for call. If symptomatic, describe symptoms. ---Patient states she has been having vertigo for the past few mornings.  Does the patient have any new or worsening symptoms? ---Yes  Will a triage be completed? ---Yes  Related visit to physician within the last 2 weeks? ---No  Does the PT have any chronic conditions? (i.e. diabetes, asthma, etc.) ---No  Is the patient pregnant or possibly pregnant? (Ask all females between the ages of 67-55) ---No  Is this a behavioral health or substance abuse call? ---No     Guidelines    Guideline Title Affirmed Question Affirmed Notes  Pregnancy - Abdominal Pain Greater Than [redacted] Weeks EGA MODERATE-SEVERE abdominal pain (e.g., interferes with normal activities, awakens from sleep)   Dizziness - Vertigo [1] MODERATE dizziness (e.g., vertigo; feels very unsteady, interferes with normal activities) AND [2] has NOT been evaluated by physician for this    Final Disposition User   See Physician within 24 Hours Noe, RN, Stanton Kidney    Comments  No appt available asked caller to call back after noon for Saturday appt since she needs to get someone to drive her   Referrals  [facility]   Caller Disagree/Comply Comply  Caller Understands Yes  PreDisposition Call Doctor

## 2017-06-18 ENCOUNTER — Encounter: Payer: Self-pay | Admitting: Family Medicine

## 2017-06-18 ENCOUNTER — Ambulatory Visit: Payer: BLUE CROSS/BLUE SHIELD | Admitting: Family Medicine

## 2017-06-18 VITALS — BP 120/80 | HR 91 | Temp 98.4°F | Wt 192.0 lb

## 2017-06-18 DIAGNOSIS — R42 Dizziness and giddiness: Secondary | ICD-10-CM

## 2017-06-18 NOTE — Patient Instructions (Signed)

## 2017-06-18 NOTE — Progress Notes (Signed)
Subjective:     Patient ID: Jessica [name], female   DOB: 1966-06-04, 51 y.o.   MRN: [id number]  HPI Patient seen as a work in with onset of dizziness last Tuesday.  When she first got up Tuesday morning  noticed some vertigo type symptoms lasting about an hour. She had similar symptoms Wednesday and gradually improving some during the week. She has had some chronic sinus congestion and frequent allergies symptoms and at baseline takes Allegra, Levocetirizine, and Singulair. No recent fever. She's had some sinus type headache past couple days mostly frontal sinus region. She's not had any ataxia or any focal weakness. No vision changes. No diplopia.  Past Medical History:  Diagnosis Date  . ALLERGIC RHINITIS CAUSE UNSPECIFIED   . ANXIETY DISORDER   . CYSTIC ACNE   . DEPRESSION   . DYSPLASTIC NEVUS, CHEST   . ELEVATED BP READING WITHOUT DX HYPERTENSION   . INSOMNIA   . Mixed hyperlipidemia   . THROMBOCYTOSIS    Past Surgical History:  Procedure Laterality Date  . BREAST SURGERY  2000   Breast reduction  . Deviated septum repair  1997    reports that  has never smoked. she has never used smokeless tobacco. She reports that she does not drink alcohol or use drugs. family history includes Coronary artery disease in her paternal grandfather; Depression in her sister; Hyperlipidemia in her father; Hypertension in her father. No Known Allergies   Review of Systems  Constitutional: Negative for chills and fever.  HENT: Negative for hearing loss.   Neurological: Positive for dizziness and headaches.       Objective:   Physical Exam  Constitutional: She is oriented to person, place, and time. She appears well-developed and well-nourished.  HENT:  Head: Atraumatic.  Right Ear: External ear normal.  Left Ear: External ear normal.  Neurological: She is alert and oriented to person, place, and time. No cranial nerve deficit. Coordination normal.  No focal weakness. Cranial nerves II  through XII are normal. Normal cerebellar by finger-nose       Assessment:     Patient presents with few day history of vertigo. We performed Dix-Hallpike maneuver and could not retrigger vertigo now but her symptoms are actually improving some. Suspect benign peripheral positional vertigo    Plan:     -Handout on vertigo given -We reviewed red flags for concerning symptoms for vertigo and follow-up promptly if any are noted -Consider Epley maneuvers if she has any recurrence and pay attention to see if this is directional  Eulas Post MD [facility]

## 2017-09-15 ENCOUNTER — Telehealth: Payer: Self-pay | Admitting: Internal Medicine

## 2017-09-15 MED ORDER — OSELTAMIVIR PHOSPHATE 75 MG PO CAPS: 75.0000 mg | ORAL_CAPSULE | Freq: Every day | ORAL | 0 refills | Status: DC

## 2017-09-15 NOTE — Telephone Encounter (Signed)
Copied from [place]. Topic: Quick Communication - See Telephone Encounter >> Sep 15, 2017 11:42 AM Clack, Laban Emperor wrote: CRM for notification. See Telephone encounter for:  Pt states her son and husband has the flu and would like to know if the provider would call her in something so she does not get it.  [id number] - [place], [place] [address] [phone] (Phone) [phone] (Fax)   Please f/u with pt if something is able to be called in.  09/15/17.

## 2017-09-15 NOTE — Telephone Encounter (Signed)
Spoke with pt to inform RX had been sent to [facility]

## 2017-09-15 NOTE — Telephone Encounter (Signed)
Please advise 

## 2017-09-15 NOTE — Telephone Encounter (Signed)
rx signed

## 2017-11-08 ENCOUNTER — Other Ambulatory Visit: Payer: Self-pay | Admitting: Internal Medicine

## 2017-11-09 ENCOUNTER — Other Ambulatory Visit: Payer: Self-pay | Admitting: Internal Medicine

## 2017-12-29 ENCOUNTER — Other Ambulatory Visit: Payer: Self-pay | Admitting: Emergency Medicine

## 2017-12-29 MED ORDER — BETAMETHASONE DIPROPIONATE AUG 0.05 % EX CREA: TOPICAL_CREAM | Freq: Two times a day (BID) | CUTANEOUS | 1 refills | Status: DC | PRN

## 2017-12-29 NOTE — Telephone Encounter (Signed)
Received fax from [facility] for refill on betamethasone cream. Please advise on refill

## 2018-02-13 ENCOUNTER — Other Ambulatory Visit: Payer: Self-pay | Admitting: Emergency Medicine

## 2018-02-13 MED ORDER — MONTELUKAST SODIUM 10 MG PO TABS: 10.0000 mg | ORAL_TABLET | Freq: Every day | ORAL | 1 refills | Status: DC

## 2018-02-13 MED ORDER — OMEPRAZOLE 20 MG PO CPDR: 20.0000 mg | DELAYED_RELEASE_CAPSULE | ORAL | 1 refills | Status: DC

## 2018-02-13 MED ORDER — SIMVASTATIN 20 MG PO TABS: 20.0000 mg | ORAL_TABLET | Freq: Every day | ORAL | 1 refills | Status: DC

## 2018-02-13 MED ORDER — OLOPATADINE HCL 0.1 % OP SOLN
OPHTHALMIC | 1 refills | Status: DC
Start: 2018-02-13 — End: 2019-05-29

## 2018-02-15 ENCOUNTER — Other Ambulatory Visit: Payer: Self-pay | Admitting: Emergency Medicine

## 2018-02-15 MED ORDER — BETAMETHASONE DIPROPIONATE AUG 0.05 % EX CREA: TOPICAL_CREAM | Freq: Two times a day (BID) | CUTANEOUS | 1 refills | Status: AC | PRN

## 2018-05-04 ENCOUNTER — Encounter (HOSPITAL_COMMUNITY)
Admission: EM | Disposition: A | Discharge status: Home / Self Care | Payer: Self-pay | Source: Home / Self Care | Attending: Emergency Medicine

## 2018-05-04 ENCOUNTER — Observation Stay (HOSPITAL_COMMUNITY)
Admission: EM | Admit: 2018-05-04 | Discharge: 2018-05-05 | Disposition: A | Discharge status: Home / Self Care | Payer: No Typology Code available for payment source | Attending: General Surgery | Admitting: General Surgery

## 2018-05-04 ENCOUNTER — Emergency Department (HOSPITAL_COMMUNITY): Payer: No Typology Code available for payment source | Admitting: Anesthesiology

## 2018-05-04 ENCOUNTER — Emergency Department (HOSPITAL_COMMUNITY): Payer: No Typology Code available for payment source

## 2018-05-04 ENCOUNTER — Encounter (HOSPITAL_COMMUNITY): Payer: Self-pay | Admitting: *Deleted

## 2018-05-04 ENCOUNTER — Other Ambulatory Visit: Payer: Self-pay

## 2018-05-04 DIAGNOSIS — F411 Generalized anxiety disorder: Secondary | ICD-10-CM | POA: Insufficient documentation

## 2018-05-04 DIAGNOSIS — E669 Obesity, unspecified: Secondary | ICD-10-CM | POA: Insufficient documentation

## 2018-05-04 DIAGNOSIS — Z23 Encounter for immunization: Secondary | ICD-10-CM | POA: Insufficient documentation

## 2018-05-04 DIAGNOSIS — M545 Low back pain: Secondary | ICD-10-CM | POA: Insufficient documentation

## 2018-05-04 DIAGNOSIS — R1011 Right upper quadrant pain: Secondary | ICD-10-CM | POA: Diagnosis present

## 2018-05-04 DIAGNOSIS — Q858 Other phakomatoses, not elsewhere classified: Secondary | ICD-10-CM | POA: Diagnosis not present

## 2018-05-04 DIAGNOSIS — G8929 Other chronic pain: Secondary | ICD-10-CM | POA: Insufficient documentation

## 2018-05-04 DIAGNOSIS — K219 Gastro-esophageal reflux disease without esophagitis: Secondary | ICD-10-CM | POA: Diagnosis not present

## 2018-05-04 DIAGNOSIS — F329 Major depressive disorder, single episode, unspecified: Secondary | ICD-10-CM | POA: Insufficient documentation

## 2018-05-04 DIAGNOSIS — E782 Mixed hyperlipidemia: Secondary | ICD-10-CM | POA: Insufficient documentation

## 2018-05-04 DIAGNOSIS — G4700 Insomnia, unspecified: Secondary | ICD-10-CM | POA: Insufficient documentation

## 2018-05-04 DIAGNOSIS — E278 Other specified disorders of adrenal gland: Secondary | ICD-10-CM

## 2018-05-04 DIAGNOSIS — Z6834 Body mass index (BMI) 34.0-34.9, adult: Secondary | ICD-10-CM | POA: Insufficient documentation

## 2018-05-04 DIAGNOSIS — K580 Irritable bowel syndrome with diarrhea: Secondary | ICD-10-CM | POA: Insufficient documentation

## 2018-05-04 DIAGNOSIS — K8010 Calculus of gallbladder with chronic cholecystitis without obstruction: Principal | ICD-10-CM | POA: Insufficient documentation

## 2018-05-04 DIAGNOSIS — K810 Acute cholecystitis: Secondary | ICD-10-CM | POA: Diagnosis present

## 2018-05-04 DIAGNOSIS — E279 Disorder of adrenal gland, unspecified: Secondary | ICD-10-CM | POA: Diagnosis present

## 2018-05-04 DIAGNOSIS — K8020 Calculus of gallbladder without cholecystitis without obstruction: Secondary | ICD-10-CM

## 2018-05-04 DIAGNOSIS — R109 Unspecified abdominal pain: Secondary | ICD-10-CM

## 2018-05-04 HISTORY — DX: Gastro-esophageal reflux disease without esophagitis: K21.9

## 2018-05-04 HISTORY — DX: Nausea with vomiting, unspecified: R11.2

## 2018-05-04 HISTORY — PX: CHOLECYSTECTOMY: SHX55

## 2018-05-04 HISTORY — DX: Adverse effect of unspecified anesthetic, initial encounter: T41.45XA

## 2018-05-04 HISTORY — DX: Other complications of anesthesia, initial encounter: T88.59XA

## 2018-05-04 HISTORY — DX: Other specified postprocedural states: Z98.890

## 2018-05-04 LAB — COMPREHENSIVE METABOLIC PANEL
ALBUMIN: 3.5 g/dL (ref 3.5–5.0)
ALT: 20 U/L (ref 0–44)
AST: 19 U/L (ref 15–41)
Alkaline Phosphatase: 130 U/L — ABNORMAL HIGH (ref 38–126)
Anion gap: 11 (ref 5–15)
BUN: 14 mg/dL (ref 6–20)
CALCIUM: 9.8 mg/dL (ref 8.9–10.3)
CHLORIDE: 106 mmol/L (ref 98–111)
CO2: 21 mmol/L — AB (ref 22–32)
CREATININE: 0.76 mg/dL (ref 0.44–1.00)
GFR calc non Af Amer: >60 mL/min (ref >60)
GLUCOSE: 150 mg/dL — AB (ref 70–99)
Potassium: 3.6 mmol/L (ref 3.5–5.1)
SODIUM: 138 mmol/L (ref 135–145)
Total Bilirubin: 0.3 mg/dL (ref 0.3–1.2)
Total Protein: 7.4 g/dL (ref 6.5–8.1)

## 2018-05-04 LAB — CBC
HCT: 40.8 % (ref 36.0–46.0)
HEMOGLOBIN: 13.4 g/dL (ref 12.0–15.0)
MCH: 27.5 pg (ref 26.0–34.0)
MCHC: 32.8 g/dL (ref 30.0–36.0)
MCV: 83.6 fL (ref 78.0–100.0)
Platelets: 525 10*3/uL — ABNORMAL HIGH (ref 150–400)
RBC: 4.88 MIL/uL (ref 3.87–5.11)
RDW: 12.7 % (ref 11.5–15.5)
WBC: 17.7 10*3/uL — ABNORMAL HIGH (ref 4.0–10.5)

## 2018-05-04 LAB — I-STAT BETA HCG BLOOD, ED (MC, WL, AP ONLY)

## 2018-05-04 LAB — URINALYSIS, ROUTINE W REFLEX MICROSCOPIC
BILIRUBIN URINE: NEGATIVE
GLUCOSE, UA: NEGATIVE mg/dL
HGB URINE DIPSTICK: NEGATIVE
Ketones, ur: 5 mg/dL — AB
Leukocytes, UA: NEGATIVE
Nitrite: NEGATIVE
Protein, ur: NEGATIVE mg/dL
Specific Gravity, Urine: 1.024 (ref 1.005–1.030)
pH: 6 (ref 5.0–8.0)

## 2018-05-04 LAB — LIPASE, BLOOD: LIPASE: 31 U/L (ref 11–51)

## 2018-05-04 SURGERY — LAPAROSCOPIC CHOLECYSTECTOMY WITH INTRAOPERATIVE CHOLANGIOGRAM
Anesthesia: General | Site: Abdomen

## 2018-05-04 MED ORDER — PROMETHAZINE HCL 25 MG/ML IJ SOLN: 6.2500 mg | INTRAMUSCULAR | Status: DC | PRN

## 2018-05-04 MED ORDER — PROPOFOL 10 MG/ML IV BOLUS
INTRAVENOUS | Status: DC | PRN
  Administered 2018-05-04: 150 mg via INTRAVENOUS

## 2018-05-04 MED ORDER — PANTOPRAZOLE SODIUM 40 MG PO TBEC
40.0000 mg | DELAYED_RELEASE_TABLET | Freq: Every day | ORAL | Status: DC
  Administered 2018-05-04 – 2018-05-05 (×2): 40 mg via ORAL
  Filled 2018-05-04 (×2): qty 1

## 2018-05-04 MED ORDER — GABAPENTIN 300 MG PO CAPS
300.0000 mg | ORAL_CAPSULE | ORAL | Status: AC
  Administered 2018-05-04: 300 mg via ORAL
  Filled 2018-05-04: qty 1

## 2018-05-04 MED ORDER — DIPHENHYDRAMINE HCL 25 MG PO CAPS: 25.0000 mg | ORAL_CAPSULE | Freq: Four times a day (QID) | ORAL | Status: DC | PRN

## 2018-05-04 MED ORDER — FENTANYL CITRATE (PF) 100 MCG/2ML IJ SOLN
INTRAMUSCULAR | Status: DC | PRN
  Administered 2018-05-04: 75 ug via INTRAVENOUS
  Administered 2018-05-04 (×3): 50 ug via INTRAVENOUS
  Administered 2018-05-04: 25 ug via INTRAVENOUS

## 2018-05-04 MED ORDER — MORPHINE SULFATE (PF) 2 MG/ML IV SOLN
1.0000 mg | INTRAVENOUS | Status: DC | PRN
  Administered 2018-05-04: 2 mg via INTRAVENOUS
  Filled 2018-05-04: qty 1

## 2018-05-04 MED ORDER — LACTATED RINGERS IV SOLN: INTRAVENOUS | Status: DC

## 2018-05-04 MED ORDER — FENTANYL CITRATE (PF) 100 MCG/2ML IJ SOLN
INTRAMUSCULAR | Status: AC
  Filled 2018-05-04: qty 2

## 2018-05-04 MED ORDER — IBUPROFEN 600 MG PO TABS
600.0000 mg | ORAL_TABLET | Freq: Three times a day (TID) | ORAL | Status: DC
  Administered 2018-05-04 – 2018-05-05 (×3): 600 mg via ORAL
  Filled 2018-05-04 (×3): qty 1

## 2018-05-04 MED ORDER — ACETAMINOPHEN 500 MG PO TABS
1000.0000 mg | ORAL_TABLET | Freq: Three times a day (TID) | ORAL | Status: DC
  Administered 2018-05-04 (×2): 1000 mg via ORAL
  Filled 2018-05-04 (×2): qty 2

## 2018-05-04 MED ORDER — ONDANSETRON HCL 4 MG/2ML IJ SOLN
INTRAMUSCULAR | Status: AC
  Filled 2018-05-04: qty 4

## 2018-05-04 MED ORDER — SODIUM CHLORIDE 0.45 % IV SOLN
INTRAVENOUS | Status: DC
  Administered 2018-05-04 – 2018-05-05 (×2): via INTRAVENOUS

## 2018-05-04 MED ORDER — CLONAZEPAM 0.5 MG PO TABS: 0.5000 mg | ORAL_TABLET | Freq: Two times a day (BID) | ORAL | Status: DC | PRN

## 2018-05-04 MED ORDER — ONDANSETRON HCL 4 MG/2ML IJ SOLN
4.0000 mg | Freq: Once | INTRAMUSCULAR | Status: AC
  Administered 2018-05-04: 4 mg via INTRAVENOUS
  Filled 2018-05-04: qty 2

## 2018-05-04 MED ORDER — SCOPOLAMINE 1 MG/3DAYS TD PT72
1.0000 | MEDICATED_PATCH | TRANSDERMAL | Status: DC
  Administered 2018-05-04: 1 via TRANSDERMAL
  Filled 2018-05-04: qty 1

## 2018-05-04 MED ORDER — FENTANYL CITRATE (PF) 100 MCG/2ML IJ SOLN
50.0000 ug | Freq: Once | INTRAMUSCULAR | Status: AC
  Administered 2018-05-04: 50 ug via INTRAVENOUS
  Filled 2018-05-04: qty 2

## 2018-05-04 MED ORDER — DOCUSATE SODIUM 100 MG PO CAPS
100.0000 mg | ORAL_CAPSULE | Freq: Two times a day (BID) | ORAL | Status: DC
  Administered 2018-05-04 – 2018-05-05 (×2): 100 mg via ORAL
  Filled 2018-05-04 (×2): qty 1

## 2018-05-04 MED ORDER — CELECOXIB 200 MG PO CAPS
200.0000 mg | ORAL_CAPSULE | ORAL | Status: AC
  Administered 2018-05-04: 200 mg via ORAL

## 2018-05-04 MED ORDER — ROCURONIUM BROMIDE 50 MG/5ML IV SOSY
PREFILLED_SYRINGE | INTRAVENOUS | Status: DC | PRN
  Administered 2018-05-04: 50 mg via INTRAVENOUS

## 2018-05-04 MED ORDER — SODIUM CHLORIDE 0.9 % IV BOLUS (SEPSIS)
1000.0000 mL | Freq: Once | INTRAVENOUS | Status: AC
  Administered 2018-05-04: 1000 mL via INTRAVENOUS

## 2018-05-04 MED ORDER — SODIUM CHLORIDE 0.9 % IR SOLN
Status: DC | PRN
  Administered 2018-05-04: 1

## 2018-05-04 MED ORDER — ACETAMINOPHEN 500 MG PO TABS
1000.0000 mg | ORAL_TABLET | ORAL | Status: AC
  Administered 2018-05-04: 1000 mg via ORAL
  Filled 2018-05-04: qty 2

## 2018-05-04 MED ORDER — SIMETHICONE 80 MG PO CHEW: 40.0000 mg | CHEWABLE_TABLET | Freq: Four times a day (QID) | ORAL | Status: DC | PRN

## 2018-05-04 MED ORDER — CELECOXIB 200 MG PO CAPS
ORAL_CAPSULE | ORAL | Status: AC
  Administered 2018-05-04: 200 mg via ORAL
  Filled 2018-05-04: qty 1

## 2018-05-04 MED ORDER — OXYCODONE HCL 5 MG PO TABS
ORAL_TABLET | ORAL | Status: AC
  Filled 2018-05-04: qty 1

## 2018-05-04 MED ORDER — CHLORHEXIDINE GLUCONATE CLOTH 2 % EX PADS: 6.0000 | MEDICATED_PAD | Freq: Once | CUTANEOUS | Status: DC

## 2018-05-04 MED ORDER — VORTIOXETINE HBR 20 MG PO TABS
20.0000 mg | ORAL_TABLET | Freq: Every day | ORAL | Status: DC
  Administered 2018-05-05: 20 mg via ORAL
  Filled 2018-05-04 (×2): qty 1

## 2018-05-04 MED ORDER — DEXAMETHASONE SODIUM PHOSPHATE 10 MG/ML IJ SOLN
INTRAMUSCULAR | Status: AC
  Filled 2018-05-04: qty 2

## 2018-05-04 MED ORDER — DROSPIRENONE-ETHINYL ESTRADIOL 3-0.02 MG PO TABS: 1.0000 | ORAL_TABLET | Freq: Every day | ORAL | Status: DC

## 2018-05-04 MED ORDER — METHOCARBAMOL 500 MG PO TABS
500.0000 mg | ORAL_TABLET | Freq: Four times a day (QID) | ORAL | Status: DC | PRN
  Administered 2018-05-04: 500 mg via ORAL
  Filled 2018-05-04: qty 1

## 2018-05-04 MED ORDER — FENTANYL CITRATE (PF) 100 MCG/2ML IJ SOLN
25.0000 ug | INTRAMUSCULAR | Status: DC | PRN
  Administered 2018-05-04: 50 ug via INTRAVENOUS
  Administered 2018-05-04: 25 ug via INTRAVENOUS

## 2018-05-04 MED ORDER — MIDAZOLAM HCL 5 MG/5ML IJ SOLN
INTRAMUSCULAR | Status: DC | PRN
  Administered 2018-05-04: 2 mg via INTRAVENOUS

## 2018-05-04 MED ORDER — IOPAMIDOL (ISOVUE-300) INJECTION 61%
INTRAVENOUS | Status: AC
  Filled 2018-05-04: qty 50

## 2018-05-04 MED ORDER — SIMVASTATIN 20 MG PO TABS: 20.0000 mg | ORAL_TABLET | Freq: Every day | ORAL | Status: DC

## 2018-05-04 MED ORDER — LORAZEPAM 2 MG/ML IJ SOLN
1.0000 mg | Freq: Once | INTRAMUSCULAR | Status: AC
  Administered 2018-05-04: 1 mg via INTRAVENOUS
  Filled 2018-05-04: qty 1

## 2018-05-04 MED ORDER — PROMETHAZINE HCL 25 MG/ML IJ SOLN: 12.5000 mg | Freq: Four times a day (QID) | INTRAMUSCULAR | Status: DC | PRN

## 2018-05-04 MED ORDER — BUPIVACAINE-EPINEPHRINE 0.25% -1:200000 IJ SOLN
INTRAMUSCULAR | Status: DC | PRN
  Administered 2018-05-04: 20 mL

## 2018-05-04 MED ORDER — BUPIVACAINE-EPINEPHRINE (PF) 0.25% -1:200000 IJ SOLN
INTRAMUSCULAR | Status: AC
  Filled 2018-05-04: qty 30

## 2018-05-04 MED ORDER — HYDROMORPHONE HCL 1 MG/ML IJ SOLN
1.0000 mg | Freq: Once | INTRAMUSCULAR | Status: AC
  Administered 2018-05-04: 1 mg via INTRAVENOUS
  Filled 2018-05-04: qty 1

## 2018-05-04 MED ORDER — OLOPATADINE HCL 0.1 % OP SOLN
1.0000 [drp] | Freq: Two times a day (BID) | OPHTHALMIC | Status: DC
  Filled 2018-05-04: qty 5

## 2018-05-04 MED ORDER — LACTATED RINGERS IV SOLN
INTRAVENOUS | Status: DC
  Administered 2018-05-04: 08:00:00 via INTRAVENOUS

## 2018-05-04 MED ORDER — POLYETHYLENE GLYCOL 3350 17 G PO PACK
17.0000 g | PACK | Freq: Every day | ORAL | Status: DC | PRN
  Administered 2018-05-04: 17 g via ORAL
  Filled 2018-05-04 (×2): qty 1

## 2018-05-04 MED ORDER — SCOPOLAMINE 1 MG/3DAYS TD PT72
MEDICATED_PATCH | TRANSDERMAL | Status: AC
  Filled 2018-05-04: qty 1

## 2018-05-04 MED ORDER — OXYCODONE HCL 5 MG PO TABS
5.0000 mg | ORAL_TABLET | Freq: Once | ORAL | Status: AC | PRN
  Administered 2018-05-04: 5 mg via ORAL

## 2018-05-04 MED ORDER — INFLUENZA VAC SPLIT QUAD 0.5 ML IM SUSY
0.5000 mL | PREFILLED_SYRINGE | INTRAMUSCULAR | Status: AC | PRN
  Administered 2018-05-05: 0.5 mL via INTRAMUSCULAR
  Filled 2018-05-04: qty 0.5

## 2018-05-04 MED ORDER — ENOXAPARIN SODIUM 40 MG/0.4ML ~~LOC~~ SOLN
40.0000 mg | SUBCUTANEOUS | Status: DC
  Administered 2018-05-04: 40 mg via SUBCUTANEOUS
  Filled 2018-05-04: qty 0.4

## 2018-05-04 MED ORDER — FENTANYL CITRATE (PF) 100 MCG/2ML IJ SOLN
100.0000 ug | Freq: Once | INTRAMUSCULAR | Status: AC
  Administered 2018-05-04: 100 ug via INTRAVENOUS
  Filled 2018-05-04: qty 2

## 2018-05-04 MED ORDER — LACTATED RINGERS IV SOLN
INTRAVENOUS | Status: DC | PRN
  Administered 2018-05-04 (×2): via INTRAVENOUS

## 2018-05-04 MED ORDER — IOHEXOL 300 MG/ML  SOLN
100.0000 mL | Freq: Once | INTRAMUSCULAR | Status: AC | PRN
  Administered 2018-05-04: 100 mL via INTRAVENOUS

## 2018-05-04 MED ORDER — DIPHENHYDRAMINE HCL 50 MG/ML IJ SOLN: 25.0000 mg | Freq: Four times a day (QID) | INTRAMUSCULAR | Status: DC | PRN

## 2018-05-04 MED ORDER — METOPROLOL TARTRATE 5 MG/5ML IV SOLN: 5.0000 mg | Freq: Four times a day (QID) | INTRAVENOUS | Status: DC | PRN

## 2018-05-04 MED ORDER — ONDANSETRON HCL 4 MG/2ML IJ SOLN: 4.0000 mg | Freq: Four times a day (QID) | INTRAMUSCULAR | Status: DC | PRN

## 2018-05-04 MED ORDER — MIDAZOLAM HCL 2 MG/2ML IJ SOLN
INTRAMUSCULAR | Status: AC
  Filled 2018-05-04: qty 2

## 2018-05-04 MED ORDER — DEXAMETHASONE SODIUM PHOSPHATE 10 MG/ML IJ SOLN
INTRAMUSCULAR | Status: DC | PRN
  Administered 2018-05-04: 10 mg via INTRAVENOUS

## 2018-05-04 MED ORDER — FENTANYL CITRATE (PF) 250 MCG/5ML IJ SOLN
INTRAMUSCULAR | Status: AC
  Filled 2018-05-04: qty 5

## 2018-05-04 MED ORDER — LIDOCAINE 2% (20 MG/ML) 5 ML SYRINGE
INTRAMUSCULAR | Status: DC | PRN
  Administered 2018-05-04: 60 mg via INTRAVENOUS

## 2018-05-04 MED ORDER — LIDOCAINE 2% (20 MG/ML) 5 ML SYRINGE
INTRAMUSCULAR | Status: AC
  Filled 2018-05-04: qty 5

## 2018-05-04 MED ORDER — ONDANSETRON HCL 4 MG/2ML IJ SOLN
INTRAMUSCULAR | Status: DC | PRN
  Administered 2018-05-04: 4 mg via INTRAVENOUS

## 2018-05-04 MED ORDER — SUGAMMADEX SODIUM 200 MG/2ML IV SOLN
INTRAVENOUS | Status: DC | PRN
  Administered 2018-05-04: 200 mg via INTRAVENOUS

## 2018-05-04 MED ORDER — OXYCODONE HCL 5 MG PO TABS: 5.0000 mg | ORAL_TABLET | ORAL | Status: DC | PRN

## 2018-05-04 MED ORDER — DEXMEDETOMIDINE HCL 200 MCG/2ML IV SOLN
INTRAVENOUS | Status: DC | PRN
  Administered 2018-05-04 (×2): 4 ug via INTRAVENOUS

## 2018-05-04 MED ORDER — OXYCODONE HCL 5 MG/5ML PO SOLN: 5.0000 mg | Freq: Once | ORAL | Status: AC | PRN

## 2018-05-04 MED ORDER — PROPOFOL 10 MG/ML IV BOLUS
INTRAVENOUS | Status: AC
  Filled 2018-05-04: qty 20

## 2018-05-04 MED ORDER — FENTANYL CITRATE (PF) 100 MCG/2ML IJ SOLN: 50.0000 ug | Freq: Once | INTRAMUSCULAR | Status: DC

## 2018-05-04 MED ORDER — ONDANSETRON 4 MG PO TBDP: 4.0000 mg | ORAL_TABLET | Freq: Four times a day (QID) | ORAL | Status: DC | PRN

## 2018-05-04 MED ORDER — SODIUM CHLORIDE 0.9 % IV SOLN
2.0000 g | INTRAVENOUS | Status: AC
  Administered 2018-05-04: 2 g via INTRAVENOUS
  Filled 2018-05-04: qty 20

## 2018-05-04 MED ORDER — BUPROPION HCL ER (XL) 150 MG PO TB24
300.0000 mg | ORAL_TABLET | Freq: Every day | ORAL | Status: DC
  Administered 2018-05-04 – 2018-05-05 (×2): 300 mg via ORAL
  Filled 2018-05-04 (×2): qty 2

## 2018-05-04 MED ORDER — SUCCINYLCHOLINE CHLORIDE 20 MG/ML IJ SOLN
INTRAMUSCULAR | Status: DC | PRN
  Administered 2018-05-04: 100 mg via INTRAVENOUS

## 2018-05-04 MED ORDER — 0.9 % SODIUM CHLORIDE (POUR BTL) OPTIME
TOPICAL | Status: DC | PRN
  Administered 2018-05-04: 1000 mL

## 2018-05-04 SURGICAL SUPPLY — 43 items
APPLIER CLIP 5 13 M/L LIGAMAX5 (MISCELLANEOUS) ×2
BLADE CLIPPER SURG (BLADE) IMPLANT
CANISTER SUCT 3000ML PPV (MISCELLANEOUS) ×2 IMPLANT
CHLORAPREP W/TINT 26ML (MISCELLANEOUS) ×2 IMPLANT
CLIP APPLIE 5 13 M/L LIGAMAX5 (MISCELLANEOUS) ×1 IMPLANT
COVER MAYO STAND STRL (DRAPES) ×2 IMPLANT
COVER SURGICAL LIGHT HANDLE (MISCELLANEOUS) ×2 IMPLANT
DERMABOND ADVANCED (GAUZE/BANDAGES/DRESSINGS) ×1
DERMABOND ADVANCED .7 DNX12 (GAUZE/BANDAGES/DRESSINGS) ×1 IMPLANT
DRAPE C-ARM 42X72 X-RAY (DRAPES) ×2 IMPLANT
ELECT REM PT RETURN 9FT ADLT (ELECTROSURGICAL) ×2
ELECTRODE REM PT RTRN 9FT ADLT (ELECTROSURGICAL) ×1 IMPLANT
FILTER SMOKE EVAC LAPAROSHD (FILTER) ×2 IMPLANT
GLOVE BIO SURGEON STRL SZ8 (GLOVE) ×2 IMPLANT
GLOVE BIOGEL PI IND STRL 8 (GLOVE) ×1 IMPLANT
GLOVE BIOGEL PI INDICATOR 8 (GLOVE) ×1
GOWN STRL REUS W/ TWL LRG LVL3 (GOWN DISPOSABLE) ×2 IMPLANT
GOWN STRL REUS W/ TWL XL LVL3 (GOWN DISPOSABLE) ×1 IMPLANT
GOWN STRL REUS W/TWL LRG LVL3 (GOWN DISPOSABLE) ×2
GOWN STRL REUS W/TWL XL LVL3 (GOWN DISPOSABLE) ×1
KIT BASIN OR (CUSTOM PROCEDURE TRAY) ×2 IMPLANT
KIT TURNOVER KIT B (KITS) ×2 IMPLANT
L-HOOK LAP DISP 36CM (ELECTROSURGICAL) ×2
LHOOK LAP DISP 36CM (ELECTROSURGICAL) ×1 IMPLANT
NEEDLE 22X1 1/2 (OR ONLY) (NEEDLE) ×2 IMPLANT
NS IRRIG 1000ML POUR BTL (IV SOLUTION) ×2 IMPLANT
PAD ARMBOARD 7.5X6 YLW CONV (MISCELLANEOUS) ×2 IMPLANT
PENCIL BUTTON HOLSTER BLD 10FT (ELECTRODE) ×2 IMPLANT
POUCH RETRIEVAL ECOSAC 10 (ENDOMECHANICALS) ×1 IMPLANT
POUCH RETRIEVAL ECOSAC 10MM (ENDOMECHANICALS) ×1
SCISSORS LAP 5X35 DISP (ENDOMECHANICALS) ×2 IMPLANT
SET CHOLANGIOGRAPH 5 50 .035 (SET/KITS/TRAYS/PACK) ×2 IMPLANT
SET IRRIG TUBING LAPAROSCOPIC (IRRIGATION / IRRIGATOR) ×2 IMPLANT
SLEEVE ENDOPATH XCEL 5M (ENDOMECHANICALS) ×4 IMPLANT
SPECIMEN JAR SMALL (MISCELLANEOUS) ×2 IMPLANT
SUT VIC AB 4-0 PS2 27 (SUTURE) ×2 IMPLANT
TOWEL OR 17X24 6PK STRL BLUE (TOWEL DISPOSABLE) ×2 IMPLANT
TOWEL OR 17X26 10 PK STRL BLUE (TOWEL DISPOSABLE) ×2 IMPLANT
TRAY LAPAROSCOPIC MC (CUSTOM PROCEDURE TRAY) ×2 IMPLANT
TROCAR XCEL BLUNT TIP 100MML (ENDOMECHANICALS) ×2 IMPLANT
TROCAR XCEL NON-BLD 5MMX100MML (ENDOMECHANICALS) ×2 IMPLANT
TUBING INSUFFLATION (TUBING) ×2 IMPLANT
WATER STERILE IRR 1000ML POUR (IV SOLUTION) ×2 IMPLANT

## 2018-05-04 NOTE — H&P (Signed)
[name] Admission Note  Jessica [name] [birth year]  [id number].    Requesting MD: Gerlene Fee Chief Complaint/Reason for Consult: gallstones  HPI:  Jessica [name] is a 52yo female who presented to [facility] early this morning with acute onset abdominal pain. States that it started around 2130 last night after dinner. Pain is diffuse and associated with nausea, vomiting, and diarrhea. States that she has never had pain like this before. Pain is worse with movement and palpation. H/o IBS so she initially thought her pain was coming from this. ED workup included CT abdomen/pelvic which showed no acute intra-abdominal process. U/s revealed cholelithiasis without sonographic evidence of acute cholecystitis. WBC 17.7, AST 19, ALT 20, alk phos 130, total bilirubin 0.3, lipase 31. General surgery asked to see.  PMH significant for anxiety/depression, HLD, GERD Abdominal surgical history: none Anticoagulants: none Nonsmoker Employment: Architect Lives at home with husband and children  ROS: Review of Systems  Constitutional: Negative.   HENT: Negative.   Eyes: Negative.   Respiratory: Negative.   Cardiovascular: Negative.   Gastrointestinal: Positive for abdominal pain, diarrhea, nausea and vomiting.  Genitourinary: Negative.   Musculoskeletal: Negative.   Skin: Negative.   Neurological: Negative.     All systems reviewed and otherwise negative except for as above  Family History  Problem Relation Age of Onset  . Hyperlipidemia Father   . Hypertension Father   . Depression Sister   . Coronary artery disease Paternal Grandfather     Past Medical History:  Diagnosis Date  . ALLERGIC RHINITIS CAUSE UNSPECIFIED   . ANXIETY DISORDER   . CYSTIC ACNE   . DEPRESSION   . DYSPLASTIC NEVUS, CHEST   . ELEVATED BP READING WITHOUT DX HYPERTENSION   . INSOMNIA   . Mixed hyperlipidemia   . THROMBOCYTOSIS     Past Surgical History:  Procedure Laterality  Date  . BREAST SURGERY  2000   Breast reduction  . Deviated septum repair  1997    Social History:  reports that she has never smoked. She has never used smokeless tobacco. She reports that she does not drink alcohol or use drugs.  Allergies: No Known Allergies   (Not in a hospital admission)  Prior to Admission medications   Medication Sig Start Date End Date Taking? Authorizing Provider  augmented betamethasone dipropionate (DIPROLENE-AF) 0.05 % cream Apply topically 2 (two) times daily as needed. 02/15/18   Binnie Rail, MD  buPROPion (WELLBUTRIN XL) 300 MG 24 hr tablet Take 300 mg daily by mouth. 05/05/17   [provider]  cetirizine (ZYRTEC) 10 MG tablet Take 10 mg by mouth as needed.      [provider]  Ciclopirox 1 % shampoo apply 2x/wk x4wk, leave on 31mn, rinse off 06/03/17   Burns, SClaudina Lick MD  ClonazePAM (KLONOPIN PO) Take 1 tablet by mouth 2 (two) times daily as needed.    [provider]  drospirenone-ethinyl estradiol (YAZ) 3-0.02 MG per tablet Take 1 tablet by mouth daily.      [provider]  fexofenadine (ALLEGRA) 180 MG tablet Take 180 mg by mouth as needed.    [provider]  Melatonin 3 MG TABS Take by mouth at bedtime as needed.     [provider]  meloxicam (MOBIC) 15 MG tablet Take 1 tablet (15 mg total) by mouth daily. Patient taking differently: Take 15 mg daily as needed by mouth.  01/24/17   SLyndal Pulley DO  montelukast (SINGULAIR)  10 MG tablet Take 1 tablet (10 mg total) by mouth at bedtime. 02/13/18   Burns, Claudina Lick, MD  olopatadine (PATANOL) 0.1 % ophthalmic solution INSTILL 1 DROP INTO BOTH EYES TWICE DAILY. GENERIC EQUIVALENT FOR PATANOL. MUST SEE PROVIDER FOR FUTURE REFILL. 02/13/18   Binnie Rail, MD  omeprazole (PRILOSEC) 20 MG capsule Take 1 capsule (20 mg total) by mouth every other day. 02/13/18   Binnie Rail, MD  oseltamivir (TAMIFLU) 75 MG capsule Take 1 capsule (75 mg total) by mouth  daily. 09/15/17   Binnie Rail, MD  Phenylephrine HCl 5 MG TABS Take by mouth as needed.      [provider]  simvastatin (ZOCOR) 20 MG tablet Take 1 tablet (20 mg total) by mouth at bedtime. 02/13/18   Binnie Rail, MD  Vortioxetine HBr (TRINTELLIX) 20 MG TABS Take 20 mg by mouth daily. 06/02/15   Rowe Clack, MD    Blood pressure (!) 141/69, pulse (!) 103, temperature 98.9 F (37.2 C), temperature source Oral, resp. rate (!) 25, SpO2 99 %. Physical Exam: General: pleasant, WD/WN white female who is laying in bed in NAD but appears uncomfortable HEENT: head is normocephalic, atraumatic.  Sclera are noninjected.  Pupils equal and round.  Ears and nose without any masses or lesions.  Mouth is pink and moist. Dentition fair Heart: regular, rate, and rhythm.  No obvious murmurs, gallops, or rubs noted.  Palpable pedal pulses bilaterally Lungs: CTAB, no wheezes, rhonchi, or rales noted.  Respiratory effort nonlabored Abd: obese, soft, ND, +BS, no masses, hernias, or organomegaly, mild diffuse TTP with moderate TTP RUQ MS: all 4 extremities are symmetrical with no cyanosis, clubbing, or edema. Skin: warm and dry with no masses, lesions, or rashes Psych: A&Ox3 Neuro: cranial nerves grossly intact, extremity CSM intact bilaterally, normal speech  Results for orders placed or performed during the hospital encounter of 05/04/18 (from the past 48 hour(s))  Urinalysis, Routine w reflex microscopic     Status: Abnormal   Collection Time: 05/04/18  1:38 AM  Result Value Ref Range   Color, Urine YELLOW YELLOW   APPearance CLEAR CLEAR   Specific Gravity, Urine 1.024 1.005 - 1.030   pH 6.0 5.0 - 8.0   Glucose, UA NEGATIVE NEGATIVE mg/dL   Hgb urine dipstick NEGATIVE NEGATIVE   Bilirubin Urine NEGATIVE NEGATIVE   Ketones, ur 5 (A) NEGATIVE mg/dL   Protein, ur NEGATIVE NEGATIVE mg/dL   Nitrite NEGATIVE NEGATIVE   Leukocytes, UA NEGATIVE NEGATIVE    Comment: Performed at [facility], [address], [place], [place] [address]  Lipase, blood     Status: None   Collection Time: 05/04/18  1:42 AM  Result Value Ref Range   Lipase 31 11 - 51 U/L    Comment: Performed at [facility] [address], [place], [place] [address]  Comprehensive metabolic panel     Status: Abnormal   Collection Time: 05/04/18  1:42 AM  Result Value Ref Range   Sodium 138 135 - 145 mmol/L   Potassium 3.6 3.5 - 5.1 mmol/L   Chloride 106 98 - 111 mmol/L   CO2 21 (L) 22 - 32 mmol/L   Glucose, Bld 150 (H) 70 - 99 mg/dL   BUN 14 6 - 20 mg/dL   Creatinine, Ser 0.76 0.44 - 1.00 mg/dL   Calcium 9.8 8.9 - 10.3 mg/dL   Total Protein 7.4 6.5 - 8.1 g/dL   Albumin  3.5 3.5 - 5.0 g/dL   AST 19 15 - 41 U/L   ALT 20 0 - 44 U/L   Alkaline Phosphatase 130 (H) 38 - 126 U/L   Total Bilirubin 0.3 0.3 - 1.2 mg/dL   GFR calc non Af Amer >60 >60 mL/min   GFR calc Af Amer >60 >60 mL/min    Comment: (NOTE) The eGFR has been calculated using the CKD EPI equation. This calculation has not been validated in all clinical situations. eGFR's persistently <60 mL/min signify possible Chronic Kidney Disease.    Anion gap 11 5 - 15    Comment: Performed at [facility] [address] [place], [place] [address]  CBC     Status: Abnormal   Collection Time: 05/04/18  1:42 AM  Result Value Ref Range   WBC 17.7 (H) 4.0 - 10.5 K/uL   RBC 4.88 3.87 - 5.11 MIL/uL   Hemoglobin 13.4 12.0 - 15.0 g/dL   HCT 40.8 36.0 - 46.0 %   MCV 83.6 78.0 - 100.0 fL   MCH 27.5 26.0 - 34.0 pg   MCHC 32.8 30.0 - 36.0 g/dL   RDW 12.7 11.5 - 15.5 %   Platelets 525 (H) 150 - 400 K/uL    Comment: Performed at [facility], [place] [address] [place], [place] [address]  I-Stat beta hCG blood, ED     Status: None   Collection Time: 05/04/18  1:47 AM  Result Value Ref Range   I-stat hCG, quantitative <5.0 <5 mIU/mL   Comment 3            Comment:   GEST. AGE      CONC.  (mIU/mL)   <=1 WEEK        5 - 50     2 WEEKS        50 - 500     3 WEEKS       100 - 10,000     4 WEEKS     1,000 - 30,000        FEMALE AND NON-PREGNANT FEMALE:     LESS THAN 5 mIU/mL    Ct Abdomen Pelvis W Contrast  Result Date: 05/04/2018 CLINICAL DATA:  Initial evaluation for acute lower abdominal cramping with nausea. EXAM: CT ABDOMEN AND PELVIS WITH CONTRAST TECHNIQUE: Multidetector CT imaging of the abdomen and pelvis was performed using the standard protocol following bolus administration of intravenous contrast. CONTRAST:  119m OMNIPAQUE IOHEXOL 300 MG/ML  SOLN COMPARISON:  None available. FINDINGS: Lower chest: Mild scattered subsegmental atelectatic changes present within the lung bases. Visualized lungs are otherwise clear. Hepatobiliary: Liver demonstrates a normal contrast enhanced appearance. Cholelithiasis. No biliary dilatation. Pancreas: Pancreas within normal limits. Spleen: Unremarkable. Adrenals/Urinary Tract: 2.2 cm right adrenal nodule, indeterminate. Adrenal glands otherwise unremarkable. Kidneys equal in size with symmetric enhancement. No nephrolithiasis, hydronephrosis, or focal enhancing renal mass. No hydroureter. Partially distended bladder within normal limits. Stomach/Bowel: Stomach within normal limits. No evidence for bowel obstruction. Normal appendix. No acute inflammatory changes seen about the bowels. Single noninflamed diverticulum noted at the descending colon. Vascular/Lymphatic: Normal intravascular enhancement seen throughout the intra-abdominal aorta and its branch vessels. Mild atheromatous plaque about the aortic bifurcation. No aneurysm. No adenopathy. Reproductive: Uterus and ovaries within normal limits. Other: No free air or fluid. Musculoskeletal: No acute osseus abnormality. No worrisome lytic or blastic osseous lesions. IMPRESSION: 1. No CT evidence for acute intra-abdominal or pelvic process. 2. Cholelithiasis. 3. 2.2 cm right  adrenal nodule, indeterminate. Follow-up examination with nonemergent  adrenal mass protocol CT recommended for further evaluation. Electronically Signed   By: Jeannine Boga M.D.   On: 05/04/2018 04:06   US Abdomen Limited Ruq  Result Date: 05/04/2018 CLINICAL DATA:  52 year old female with right upper quadrant abdominal pain. EXAM: ULTRASOUND ABDOMEN LIMITED RIGHT UPPER QUADRANT COMPARISON:  CT of the abdomen pelvis dated 05/04/2018 FINDINGS: Gallbladder: There multiple stones within the gallbladder. No gallbladder wall thickening or pericholecystic fluid. Negative sonographic Murphy's sign. Common bile duct: Diameter: 3 mm Liver: The liver is unremarkable. Portal vein is patent on color Doppler imaging with normal direction of blood flow towards the liver. IMPRESSION: Cholelithiasis without sonographic evidence of acute cholecystitis. Electronically Signed   By: Anner Crete M.D.   On: 05/04/2018 05:40      Assessment/Plan Anxiety/depression GERD HLD R adrenal nodule - 2.2cm seen on CT, needs follow up   Symptomatic cholelithiasis - Patient with leukocytosis, elevated alk phos, and uncontrolled abdominal pain in the ED from symptomatic cholelithiasis, possible acute cholecystitis. Will admit to surgery and plan for laparoscopic cholecystectomy today. Keep NPO. Rocephin ordered.  Wellington Hampshire, [facility] 05/04/2018, 7:49 AM Pager: [phone] Consults: [phone] Mon 7:00 am -11:30 AM Tues-Fri 7:00 am-4:30 pm Sat-Sun 7:00 am-11:30 am

## 2018-05-04 NOTE — Anesthesia Preprocedure Evaluation (Signed)
Anesthesia Evaluation  Patient identified by MRN, date of birth, ID band Patient awake    Reviewed: Allergy & Precautions, NPO status , Patient's Chart, lab work & pertinent test results  History of Anesthesia Complications (+) PONV and history of anesthetic complications  Airway Mallampati: III  TM Distance: >3 FB Neck ROM: Full    Dental no notable dental hx. (+) Teeth Intact   Pulmonary neg pulmonary ROS,    Pulmonary exam normal        Cardiovascular negative cardio ROS Normal cardiovascular exam     Neuro/Psych PSYCHIATRIC DISORDERS Anxiety Depression negative neurological ROS     GI/Hepatic Neg liver ROS, GERD  ,  Endo/Other  negative endocrine ROS  Renal/GU negative Renal ROS  negative genitourinary   Musculoskeletal negative musculoskeletal ROS (+)   Abdominal   Peds  Hematology negative hematology ROS (+)   Anesthesia Other Findings   Reproductive/Obstetrics negative OB ROS                            Anesthesia Physical Anesthesia Plan  ASA: II  Anesthesia Plan: General   Post-op Pain Management:    Induction: Intravenous and Rapid sequence  PONV Risk Score and Plan: 4 or greater and Ondansetron, Dexamethasone, Treatment may vary due to age or medical condition, Midazolam and Scopolamine patch - Pre-op  Airway Management Planned: Oral ETT  Additional Equipment:   Intra-op Plan:   Post-operative Plan: Extubation in OR  Informed Consent: I have reviewed the patients History and Physical, chart, labs and discussed the procedure including the risks, benefits and alternatives for the proposed anesthesia with the patient or authorized representative who has indicated his/her understanding and acceptance.     Plan Discussed with:   Anesthesia Plan Comments:        Anesthesia Quick Evaluation

## 2018-05-04 NOTE — Op Note (Signed)
05/04/2018  11:31 AM  PATIENT:  Jessica [name]  52 y.o. female  PRE-OPERATIVE DIAGNOSIS: Acute cholecystitis with cholelithiasis  POST-OPERATIVE DIAGNOSIS: Acute cholecystitis with cholelithiasis, multiple small superficial liver lesions  PROCEDURE:  Procedure(s): LAPAROSCOPIC CHOLECYSTECTOMY LAPAROSCOPIC LIVER BIOPSY  SURGEON:  Surgeon(s): Jessica Skeans, MD  ASSISTANTS: Jessica Billet, PA-C   ANESTHESIA:   local and general  EBL:  Total I/O In: 1000 [I.V.:1000] Out: 20 [Blood:20]  BLOOD ADMINISTERED:none  DRAINS: none   SPECIMEN:  Excision  DISPOSITION OF SPECIMEN:  PATHOLOGY  COUNTS:  YES  DICTATION: .Dragon Dictation Findings: Severe acute cholecystitis with cholelithiasis, cystic duct too short to perform cholangiogram  Procedure detail: Jessica [name] presents for cholecystectomy.  She was identified in the preop holding area.  Informed consent was obtained.  She received intravenous antibiotics.  She was brought to the operating room and general endotracheal anesthesia was administered by the anesthesia staff.  Her abdomen was prepped and draped in sterile fashion.  We did a timeout procedure.The infraumbilical region was infiltrated with local. Infraumbilical incision was made. Subcutaneous tissues were dissected down revealing the anterior fascia. This was divided sharply along the midline. Peritoneal cavity was entered under direct vision without complication. A 0 Vicryl pursestring was placed around the fascial opening. Hassan trocar was inserted into the abdomen. The abdomen was insufflated with carbon dioxide in standard fashion. Under direct vision a 5 mm epigastric and 5 mm right port x2 were placed.  Local was used at each port site.  Laparoscopic expiration revealed a severely inflamed gallbladder.  It was tensely distended so I drained it with the Nahzat suction.  The liver had diffuse scattered small punctate white superficial lesions of uncertain etiology.  The  dome of the gallbladder was then retracted superior medially.  The infundibulum was retracted inferior laterally.  Dissection began laterally and progressed medially identifying the cystic duct.  It was noted to be only about 1 cm long between the infundibulum and the common bile duct.  This precluded doing a cholangiogram.  We had a critical view of safety.  3 clips were placed proximally on the cystic duct.  One was placed on the infundibulum and the cystic duct was divided.  Further dissection revealed the cystic artery which was clipped twice proximally and divided distally with cautery.  The gallbladder was then taken off the liver bed using cautery and achieving excellent hemostasis along the way.  The gallbladder was placed in a bag and removed from the abdomen.  It was sent to pathology.  Liver bed was copiously irrigated and hemostasis was ensured.  The clips remain in good position.  Next, I chose an easily accessible white punctate lesion among the many that were scattered along the surface of the right lobe of the liver.  Which is one near the falciform ligament and it was sharply excised and sent to pathology.  The site was then cauterized getting excellent hemostasis.  The liver bed was rechecked and remained dry.  Ports were removed under direct vision.  Pneumoperitoneum was released.  The infraumbilical fascia was closed by tying the pursestring.  All 4 wounds were irrigated and the skin which was closed with running 4-0 Monocryl subcuticular followed by Dermabond.  All counts were correct.  She tolerated procedure well that apparent complication was taken recovery in stable condition.  PATIENT DISPOSITION:  PACU - hemodynamically stable.   Delay start of Pharmacological VTE agent (>24hrs) due to surgical blood loss or risk of bleeding:  no  Jessica [name]  Jessica Silos, MD, MPH, [place] Pager: [phone]  9/26/201911:31 AM

## 2018-05-04 NOTE — ED Triage Notes (Signed)
Onset of lower abdominal cramping around 9pm last night, associated with nausea. Feels constipated. She took pepto bismol at onset of pain. Denies vaginal/urinary symptoms.

## 2018-05-04 NOTE — ED Provider Notes (Signed)
[facility] Provider Note   CSN: [id number] Arrival date & time: 05/04/18  0131     History   Chief Complaint Chief Complaint  Patient presents with  . Abdominal Pain    HPI Jessica [name] is a 52 y.o. female.  The history is provided by the patient and the spouse.  Abdominal Pain   This is a new problem. The current episode started 3 to 5 hours ago. The problem occurs constantly. The problem has been rapidly worsening. The pain is located in the generalized abdominal region. The pain is severe. Associated symptoms include nausea, vomiting and constipation. Pertinent negatives include dysuria. The symptoms are aggravated by certain positions. Nothing relieves the symptoms.  Patient reports she has been recently constipated.  She reports over the past several hours she has  had rapidly worsening generalized abdominal pain, with nausea and dry heaving.  Denies any chest pain or shortness of breath.  Past Medical History:  Diagnosis Date  . ALLERGIC RHINITIS CAUSE UNSPECIFIED   . ANXIETY DISORDER   . CYSTIC ACNE   . DEPRESSION   . DYSPLASTIC NEVUS, CHEST   . ELEVATED BP READING WITHOUT DX HYPERTENSION   . INSOMNIA   . Mixed hyperlipidemia   . THROMBOCYTOSIS     Patient Active Problem List   Diagnosis Date Noted  . Obesity with body mass index greater than 30 06/02/2017  . GERD (gastroesophageal reflux disease) 09/02/2015  . IBS (irritable bowel syndrome) 05/01/2014  . Nonallopathic lesion of thoracic region 12/24/2013  . Greater trochanteric bursitis of left hip 04/30/2013  . Chronic low back pain 04/23/2013  . Sacroiliac joint dysfunction of left side 04/19/2013  . Greater trochanteric bursitis of right hip 04/11/2013  . Hip flexor tightness 04/11/2013  . Nonallopathic lesion of lumbosacral region 04/11/2013  . Nonallopathic lesion of pelvic region 04/11/2013  . Gait abnormality 05/09/2012  . CYSTIC ACNE 06/16/2010  .  Depression 04/24/2010  . Allergic rhinitis 12/06/2008  . Thrombocytosis ([place]) 05/07/2008  . 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 Mixed hyperlipidemia 04/22/2008  . INSOMNIA 04/22/2008  . Anxiety state 10/04/2007    Past Surgical History:  Procedure Laterality Date  . BREAST SURGERY  2000   Breast reduction  . Deviated septum repair  1997     OB History   None      Home Medications    Prior to Admission medications   Medication Sig Start Date End Date Taking? Authorizing Provider  augmented betamethasone dipropionate (DIPROLENE-AF) 0.05 % cream Apply topically 2 (two) times daily as needed. 02/15/18   Binnie Rail, MD  buPROPion (WELLBUTRIN XL) 300 MG 24 hr tablet Take 300 mg daily by mouth. 05/05/17   [provider]  cetirizine (ZYRTEC) 10 MG tablet Take 10 mg by mouth as needed.      [provider]  Ciclopirox 1 % shampoo apply 2x/wk x4wk, leave on 2min, rinse off 06/03/17   Burns, Claudina Lick, MD  ClonazePAM (KLONOPIN PO) Take 1 tablet by mouth 2 (two) times daily as needed.    [provider]  drospirenone-ethinyl estradiol (YAZ) 3-0.02 MG per tablet Take 1 tablet by mouth daily.      [provider]  fexofenadine (ALLEGRA) 180 MG tablet Take 180 mg by mouth as needed.    [provider]  Melatonin 3 MG TABS Take by mouth at bedtime as needed.     [provider]  meloxicam (MOBIC) 15 MG tablet Take  1 tablet (15 mg total) by mouth daily. Patient taking differently: Take 15 mg daily as needed by mouth.  01/24/17   Lyndal Pulley, DO  montelukast (SINGULAIR) 10 MG tablet Take 1 tablet (10 mg total) by mouth at bedtime. 02/13/18   Burns, Claudina Lick, MD  olopatadine (PATANOL) 0.1 % ophthalmic solution INSTILL 1 DROP INTO BOTH EYES TWICE DAILY. GENERIC EQUIVALENT FOR PATANOL. MUST SEE PROVIDER FOR FUTURE REFILL. 02/13/18   Binnie Rail, MD  omeprazole (PRILOSEC) 20 MG capsule Take 1 capsule (20 mg total) by mouth every other day.  02/13/18   Binnie Rail, MD  oseltamivir (TAMIFLU) 75 MG capsule Take 1 capsule (75 mg total) by mouth daily. 09/15/17   Binnie Rail, MD  Phenylephrine HCl 5 MG TABS Take by mouth as needed.      [provider]  simvastatin (ZOCOR) 20 MG tablet Take 1 tablet (20 mg total) by mouth at bedtime. 02/13/18   Binnie Rail, MD  Vortioxetine HBr (TRINTELLIX) 20 MG TABS Take 20 mg by mouth daily. 06/02/15   Rowe Clack, MD    Family History Family History  Problem Relation Age of Onset  . Hyperlipidemia Father   . Hypertension Father   . Depression Sister   . Coronary artery disease Paternal Grandfather     Social History Social History   Tobacco Use  . Smoking status: Never Smoker  . Smokeless tobacco: Never Used  Substance Use Topics  . Alcohol use: No  . Drug use: No     Allergies   Patient has no known allergies.   Review of Systems Review of Systems  Respiratory: Negative for shortness of breath.   Cardiovascular: Negative for chest pain.  Gastrointestinal: Positive for abdominal pain, constipation, nausea and vomiting.  Genitourinary: Negative for dysuria.  All other systems reviewed and are negative.    Physical Exam Updated Vital Signs BP (!) 161/102   Pulse 99   Temp 98.9 F (37.2 C) (Oral)   Resp (!) 22   SpO2 99%   Physical Exam CONSTITUTIONAL: Well developed/well nourished, anxious HEAD: Normocephalic/atraumatic EYES: EOMI/PERRL ENMT: Mucous membranes moist NECK: supple no meningeal signs SPINE/BACK:entire spine nontender CV: S1/S2 noted, no murmurs/rubs/gallops noted LUNGS: Lungs are clear to auscultation bilaterally, no apparent distress ABDOMEN: soft, generalized moderate abdominal tenderness, no rebound or guarding, decreased bowel sounds noted throughout abdomen GU:no cva tenderness NEURO: Pt is awake/alert/appropriate, moves all extremitiesx4.  No facial droop.   EXTREMITIES: pulses normal/equal, full ROM SKIN:  Diaphoretic PSYCH: anxious   ED Treatments / Results  Labs (all labs ordered are listed, but only abnormal results are displayed) Labs Reviewed  COMPREHENSIVE METABOLIC PANEL - Abnormal; Notable for the following components:      Result Value   CO2 21 (*)    Glucose, Bld 150 (*)    Alkaline Phosphatase 130 (*)    All other components within normal limits  CBC - Abnormal; Notable for the following components:   WBC 17.7 (*)    Platelets 525 (*)    All other components within normal limits  URINALYSIS, ROUTINE W REFLEX MICROSCOPIC - Abnormal; Notable for the following components:   Ketones, ur 5 (*)    All other components within normal limits  LIPASE, BLOOD  I-STAT BETA HCG BLOOD, ED ([facility], [facility], [facility] ONLY)    EKG None  Radiology Ct Abdomen Pelvis W Contrast  Result Date: 05/04/2018 CLINICAL DATA:  Initial evaluation for acute lower abdominal cramping with  nausea. EXAM: CT ABDOMEN AND PELVIS WITH CONTRAST TECHNIQUE: Multidetector CT imaging of the abdomen and pelvis was performed using the standard protocol following bolus administration of intravenous contrast. CONTRAST:  152mL OMNIPAQUE IOHEXOL 300 MG/ML  SOLN COMPARISON:  None available. FINDINGS: Lower chest: Mild scattered subsegmental atelectatic changes present within the lung bases. Visualized lungs are otherwise clear. Hepatobiliary: Liver demonstrates a normal contrast enhanced appearance. Cholelithiasis. No biliary dilatation. Pancreas: Pancreas within normal limits. Spleen: Unremarkable. Adrenals/Urinary Tract: 2.2 cm right adrenal nodule, indeterminate. Adrenal glands otherwise unremarkable. Kidneys equal in size with symmetric enhancement. No nephrolithiasis, hydronephrosis, or focal enhancing renal mass. No hydroureter. Partially distended bladder within normal limits. Stomach/Bowel: Stomach within normal limits. No evidence for bowel obstruction. Normal appendix. No acute inflammatory changes seen about the bowels. Single  noninflamed diverticulum noted at the descending colon. Vascular/Lymphatic: Normal intravascular enhancement seen throughout the intra-abdominal aorta and its branch vessels. Mild atheromatous plaque about the aortic bifurcation. No aneurysm. No adenopathy. Reproductive: Uterus and ovaries within normal limits. Other: No free air or fluid. Musculoskeletal: No acute osseus abnormality. No worrisome lytic or blastic osseous lesions. IMPRESSION: 1. No CT evidence for acute intra-abdominal or pelvic process. 2. Cholelithiasis. 3. 2.2 cm right adrenal nodule, indeterminate. Follow-up examination with nonemergent adrenal mass protocol CT recommended for further evaluation. Electronically Signed   By: Jeannine Boga M.D.   On: 05/04/2018 04:06   US Abdomen Limited Ruq  Result Date: 05/04/2018 CLINICAL DATA:  52 year old female with right upper quadrant abdominal pain. EXAM: ULTRASOUND ABDOMEN LIMITED RIGHT UPPER QUADRANT COMPARISON:  CT of the abdomen pelvis dated 05/04/2018 FINDINGS: Gallbladder: There multiple stones within the gallbladder. No gallbladder wall thickening or pericholecystic fluid. Negative sonographic Murphy's sign. Common bile duct: Diameter: 3 mm Liver: The liver is unremarkable. Portal vein is patent on color Doppler imaging with normal direction of blood flow towards the liver. IMPRESSION: Cholelithiasis without sonographic evidence of acute cholecystitis. Electronically Signed   By: Anner Crete M.D.   On: 05/04/2018 05:40    Procedures Procedures   Medications Ordered in ED Medications  lactated ringers infusion (has no administration in time range)  fentaNYL (SUBLIMAZE) injection 50 mcg (50 mcg Intravenous Given 05/04/18 0250)  ondansetron (ZOFRAN) injection 4 mg (4 mg Intravenous Given 05/04/18 0250)  sodium chloride 0.9 % bolus 1,000 mL (0 mLs Intravenous Stopped 05/04/18 0621)  sodium chloride 0.9 % bolus 1,000 mL (0 mLs Intravenous Stopped 05/04/18 0621)  LORazepam  (ATIVAN) injection 1 mg (1 mg Intravenous Given 05/04/18 0318)  iohexol (OMNIPAQUE) 300 MG/ML solution 100 mL (100 mLs Intravenous Contrast Given 05/04/18 0334)  fentaNYL (SUBLIMAZE) injection 100 mcg (100 mcg Intravenous Given 05/04/18 0427)  HYDROmorphone (DILAUDID) injection 1 mg (1 mg Intravenous Given 05/04/18 0621)  ondansetron (ZOFRAN) injection 4 mg (4 mg Intravenous Given 05/04/18 0620)     Initial Impression / Assessment and Plan / ED Course  I have reviewed the triage vital signs and the nursing notes.  Pertinent labs & imaging results that were available during my care of the patient were reviewed by me and considered in my medical decision making (see chart for details).     6:59 AM Pt presents with abrupt onset of diffuse abdominal pain.  My initial concern was for acute surgical abdomen such as appendicitis.  She had elevated white count, she was very anxious and diaphoretic. CT imaging did not reveal an acute surgical process, but she did have cholelithiasis.  On repeat assessment she had significant abdominal pain particularly  right upper quadrant.  Stat ultrasound of right upper quadrant was performed, which revealed cholelithiasis without cholecystitis Despite multiple pain medication regimens, patient is still having significant right upper quadrant tenderness.  She is still very uncomfortable at this time.  I have consulted general surgery for admission for intractable pain due to cholelithiasis. She has An incidental adrenal lesion, that will require outpatient CT imaging. Final Clinical Impressions(s) / ED Diagnoses   Final diagnoses:  RUQ pain  Calculus of gallbladder without cholecystitis without obstruction  Intractable abdominal pain  Adrenal mass [facility])    ED Discharge Orders    None       Ripley Fraise, MD 05/04/18 0700

## 2018-05-04 NOTE — Transfer of Care (Signed)
Immediate Anesthesia Transfer of Care Note  Patient: Jessica [name]  Procedure(s) Performed: LAPAROSCOPIC CHOLECYSTECTOMY (N/A Abdomen)  Patient Location: PACU  Anesthesia Type:General  Level of Consciousness: awake, alert  and oriented  Airway & Oxygen Therapy: Patient Spontanous Breathing and Patient connected to nasal cannula oxygen  Post-op Assessment: Report given to RN, Post -op Vital signs reviewed and stable and Patient moving all extremities X 4  Post vital signs: Reviewed and stable  Last Vitals:  Vitals Value Taken Time  BP 114/69 05/04/2018 11:46 AM  Temp    Pulse 117 05/04/2018 11:47 AM  Resp 27 05/04/2018 11:47 AM  SpO2 92 % 05/04/2018 11:47 AM  Vitals shown include unvalidated device data.  Last Pain:  Vitals:   05/04/18 0741  TempSrc:   PainSc: Asleep         Complications: No apparent anesthesia complications

## 2018-05-04 NOTE — Anesthesia Procedure Notes (Signed)
Procedure Name: Intubation Date/Time: 05/04/2018 10:33 AM Performed by: Neldon Newport, CRNA Pre-anesthesia Checklist: Timeout performed, Patient being monitored, Suction available, Emergency Drugs available and Patient identified Patient Re-evaluated:Patient Re-evaluated prior to induction Oxygen Delivery Method: Circle system utilized Preoxygenation: Pre-oxygenation with 100% oxygen Induction Type: IV induction Ventilation: Mask ventilation without difficulty Laryngoscope Size: Mac and 3 Grade View: Grade I Tube type: Oral Tube size: 7.0 mm Number of attempts: 1 Placement Confirmation: breath sounds checked- equal and bilateral,  positive ETCO2 and ETT inserted through vocal cords under direct vision Secured at: 21 cm Tube secured with: Tape Dental Injury: Teeth and Oropharynx as per pre-operative assessment

## 2018-05-04 NOTE — Discharge Instructions (Addendum)
You will need followup imaging of your adrenal glands, contact your primary care provider We did take a biopsy of your liver during surgery. Our office will contact you with the results.   Please arrive at least 30 min before your appointment to complete your check in paperwork.  If you are unable to arrive 30 min prior to your appointment time we may have to cancel or reschedule you.  LAPAROSCOPIC SURGERY: POST OP INSTRUCTIONS  1. DIET: Follow a light bland diet the first 24 hours after arrival home, such as soup, liquids, crackers, etc. Be sure to include lots of fluids daily. Avoid fast food or heavy meals as your are more likely to get nauseated. Eat a low fat the next few days after surgery.  2. Take your usually prescribed home medications unless otherwise directed. 3. PAIN CONTROL:  1. Pain is best controlled by a usual combination of three different methods TOGETHER:  1. Ice/Heat 2. Over the counter pain medication 3. Prescription pain medication 2. Most patients will experience some swelling and bruising around the incisions. Ice packs or heating pads (30-60 minutes up to 6 times a day) will help. Use ice for the first few days to help decrease swelling and bruising, then switch to heat to help relax tight/sore spots and speed recovery. Some people prefer to use ice alone, heat alone, alternating between ice & heat. Experiment to what works for you. Swelling and bruising can take several weeks to resolve.  3. It is helpful to take an over-the-counter pain medication regularly for the first few weeks. Choose one of the following that works best for you:  1. Naproxen (Aleve, etc) Two 220mg  tabs twice a day 2. Ibuprofen (Advil, etc) Three 200mg  tabs four times a day (every meal & bedtime) 3. Acetaminophen (Tylenol, etc) 500-650mg  four times a day (every meal & bedtime) 4. A prescription for pain medication (such as oxycodone, hydrocodone, etc) should be given to you upon discharge. Take your  pain medication as prescribed.  1. If you are having problems/concerns with the prescription medicine (does not control pain, nausea, vomiting, rash, itching, etc), please call us [phone] to see if we need to switch you to a different pain medicine that will work better for you and/or control your side effect better. 2. If you need a refill on your pain medication, please contact your pharmacy. They will contact our office to request authorization. Prescriptions will not be filled after 5 pm or on week-ends. 4. Avoid getting constipated. Between the surgery and the pain medications, it is common to experience some constipation. Increasing fluid intake and taking a fiber supplement (such as Metamucil, Citrucel, FiberCon, MiraLax, etc) 1-2 times a day regularly will usually help prevent this problem from occurring. A mild laxative (prune juice, Milk of Magnesia, MiraLax, etc) should be taken according to package directions if there are no bowel movements after 48 hours.  5. Watch out for diarrhea. If you have many loose bowel movements, simplify your diet to bland foods & liquids for a few days. Stop any stool softeners and decrease your fiber supplement. Switching to mild anti-diarrheal medications (Kayopectate, Pepto Bismol) can help. If this worsens or does not improve, please call us. 6. Wash / shower every day. You may shower over the dressings as they are waterproof. Continue to shower over incision(s) after the dressing is off. If there is glue over the incisions try not to pick it off, let it fall off naturally. 7. Remove your  waterproof bandages 2 days after surgery. You may leave the incision open to air. You may replace a dressing/Band-Aid to cover the incision for comfort if you wish.  8. ACTIVITIES as tolerated:  1. You may resume regular (light) daily activities beginning the next day--such as daily self-care, walking, climbing stairs--gradually increasing activities as tolerated. If you  can walk 30 minutes without difficulty, it is safe to try more intense activity such as jogging, treadmill, bicycling, low-impact aerobics, swimming, etc. 2. Save the most intensive and strenuous activity for last such as sit-ups, heavy lifting, contact sports, etc Refrain from any heavy lifting or straining until you are off narcotics for pain control. For the first 2-3 weeks do not lift over 10-15lb.  3. DO NOT PUSH THROUGH PAIN. Let pain be your guide: If it hurts to do something, don't do it. Pain is your body warning you to avoid that activity for another week until the pain goes down. 4. You may drive when you are no longer taking prescription pain medication, you can comfortably wear a seatbelt, and you can safely maneuver your car and apply brakes. 5. You may have sexual intercourse when it is comfortable.  9. FOLLOW UP in our office  1. Please call [facility] at [phone] to set up an appointment to see your surgeon in the office for a follow-up appointment approximately 2-3 weeks after your surgery. 2. Make sure that you call for this appointment the day you arrive home to insure a convenient appointment time.      10. IF YOU HAVE DISABILITY OR FAMILY LEAVE FORMS, BRING THEM TO THE               OFFICE FOR PROCESSING.   WHEN TO CALL us [phone]:  1. Poor pain control 2. Reactions / problems with new medications (rash/itching, nausea, etc)  3. Fever over 101.5 F (38.5 C) 4. Inability to urinate 5. Nausea and/or vomiting 6. Worsening swelling or bruising 7. Continued bleeding from incision. 8. Increased pain, redness, or drainage from the incision  The clinic staff is available to answer your questions during regular business hours (8:30am-5pm). Please dont hesitate to call and ask to speak to one of our nurses for clinical concerns.  If you have a medical emergency, go to the nearest emergency room or call 911.  A surgeon from [facility] is always on call at the  [facility], [place], [place], [place], [place] [address] ?  MAIN: [phone] ? TOLL FREE: [phone] ?  FAX [phone]  [email or web address]

## 2018-05-05 ENCOUNTER — Encounter (HOSPITAL_COMMUNITY): Payer: Self-pay | Admitting: General Surgery

## 2018-05-05 LAB — COMPREHENSIVE METABOLIC PANEL
ALT: 36 U/L (ref 0–44)
AST: 48 U/L — ABNORMAL HIGH (ref 15–41)
Albumin: 2.5 g/dL — ABNORMAL LOW (ref 3.5–5.0)
Alkaline Phosphatase: 91 U/L (ref 38–126)
Anion gap: 9 (ref 5–15)
BUN: 7 mg/dL (ref 6–20)
CHLORIDE: 103 mmol/L (ref 98–111)
CO2: 23 mmol/L (ref 22–32)
CREATININE: 0.66 mg/dL (ref 0.44–1.00)
Calcium: 8 mg/dL — ABNORMAL LOW (ref 8.9–10.3)
GFR calc Af Amer: >60 mL/min (ref >60)
GLUCOSE: 94 mg/dL (ref 70–99)
Potassium: 4.1 mmol/L (ref 3.5–5.1)
Sodium: 135 mmol/L (ref 135–145)
Total Bilirubin: 1.6 mg/dL — ABNORMAL HIGH (ref 0.3–1.2)
Total Protein: 5.4 g/dL — ABNORMAL LOW (ref 6.5–8.1)

## 2018-05-05 LAB — CBC
HCT: 34.2 % — ABNORMAL LOW (ref 36.0–46.0)
Hemoglobin: 11 g/dL — ABNORMAL LOW (ref 12.0–15.0)
MCH: 27.6 pg (ref 26.0–34.0)
MCHC: 32.2 g/dL (ref 30.0–36.0)
MCV: 85.9 fL (ref 78.0–100.0)
Platelets: 401 10*3/uL — ABNORMAL HIGH (ref 150–400)
RBC: 3.98 MIL/uL (ref 3.87–5.11)
RDW: 13.2 % (ref 11.5–15.5)
WBC: 10.6 10*3/uL — AB (ref 4.0–10.5)

## 2018-05-05 LAB — HIV ANTIBODY (ROUTINE TESTING W REFLEX): HIV Screen 4th Generation wRfx: NONREACTIVE

## 2018-05-05 MED ORDER — HYDROCODONE-ACETAMINOPHEN 5-325 MG PO TABS: 1.0000 | ORAL_TABLET | ORAL | Status: DC | PRN

## 2018-05-05 MED ORDER — HYDROCODONE-ACETAMINOPHEN 5-325 MG PO TABS: 1.0000 | ORAL_TABLET | Freq: Four times a day (QID) | ORAL | 0 refills | Status: DC | PRN

## 2018-05-05 MED ORDER — ACETAMINOPHEN 325 MG PO TABS: 650.0000 mg | ORAL_TABLET | Freq: Four times a day (QID) | ORAL | Status: DC | PRN

## 2018-05-05 NOTE — Progress Notes (Signed)
Scot Jun Levy-Walstad to be D/C'd  per MD order. Discussed with the patient and all questions fully answered.  VSS, Skin clean, dry and intact without evidence of skin break down, no evidence of skin tears noted.  IV catheter discontinued intact. Site without signs and symptoms of complications. Dressing and pressure applied.  An After Visit Summary was printed and given to the patient. Patient received prescription.  D/c education completed with patient/family including follow up instructions, medication list, d/c activities limitations if indicated, with other d/c instructions as indicated by MD - patient able to verbalize understanding, all questions fully answered.   Patient instructed to return to ED, call 911, or call MD for any changes in condition.   Patient to be escorted via [place], and D/C home via private auto.

## 2018-05-05 NOTE — Anesthesia Postprocedure Evaluation (Signed)
Anesthesia Post Note  Patient: Jessica [name]  Procedure(s) Performed: LAPAROSCOPIC CHOLECYSTECTOMY (N/A Abdomen)     Patient location during evaluation: PACU Anesthesia Type: General Level of consciousness: awake and alert Pain management: pain level controlled Vital Signs Assessment: post-procedure vital signs reviewed and stable Respiratory status: spontaneous breathing, nonlabored ventilation, respiratory function stable and patient connected to nasal cannula oxygen Cardiovascular status: blood pressure returned to baseline and stable Postop Assessment: no apparent nausea or vomiting Anesthetic complications: no    Last Vitals:  Vitals:   05/05/18 0227 05/05/18 0440  BP: 116/70 120/68  Pulse: 82 86  Resp: 18 17  Temp: 36.9 C 36.8 C  SpO2: 92% 96%    Last Pain:  Vitals:   05/05/18 0900  TempSrc:   PainSc: 2    Pain Goal: Patients Stated Pain Goal: 3 (05/04/18 2015)               Lidia Collum

## 2018-05-05 NOTE — Discharge Summary (Signed)
[facility] Discharge Summary   Patient ID: Jessica [name] MRN: [id number] DOB/AGE: 09/28/1965 52 y.o.  Admit date: 05/04/2018 Discharge date: 05/05/2018  Admitting Diagnosis: Acute cholecystitis  Discharge Diagnosis Patient Active Problem List   Diagnosis Date Noted  . Acute cholecystitis 05/04/2018  . Obesity with body mass index greater than 30 06/02/2017  . GERD (gastroesophageal reflux disease) 09/02/2015  . IBS (irritable bowel syndrome) 05/01/2014  . Nonallopathic lesion of thoracic region 12/24/2013  . Greater trochanteric bursitis of left hip 04/30/2013  . Chronic low back pain 04/23/2013  . Sacroiliac joint dysfunction of left side 04/19/2013  . Greater trochanteric bursitis of right hip 04/11/2013  . Hip flexor tightness 04/11/2013  . Nonallopathic lesion of lumbosacral region 04/11/2013  . Nonallopathic lesion of pelvic region 04/11/2013  . Gait abnormality 05/09/2012  . CYSTIC ACNE 06/16/2010  . Depression 04/24/2010  . Allergic rhinitis 12/06/2008  . Thrombocytosis ([place]) 05/07/2008  . 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 Mixed hyperlipidemia 04/22/2008  . INSOMNIA 04/22/2008  . Anxiety state 10/04/2007    Consultants None  Imaging: Ct Abdomen Pelvis W Contrast  Result Date: 05/04/2018 CLINICAL DATA:  Initial evaluation for acute lower abdominal cramping with nausea. EXAM: CT ABDOMEN AND PELVIS WITH CONTRAST TECHNIQUE: Multidetector CT imaging of the abdomen and pelvis was performed using the standard protocol following bolus administration of intravenous contrast. CONTRAST:  167m OMNIPAQUE IOHEXOL 300 MG/ML  SOLN COMPARISON:  None available. FINDINGS: Lower chest: Mild scattered subsegmental atelectatic changes present within the lung bases. Visualized lungs are otherwise clear. Hepatobiliary: Liver demonstrates a normal contrast enhanced appearance. Cholelithiasis. No biliary dilatation. Pancreas: Pancreas within normal limits. Spleen:  Unremarkable. Adrenals/Urinary Tract: 2.2 cm right adrenal nodule, indeterminate. Adrenal glands otherwise unremarkable. Kidneys equal in size with symmetric enhancement. No nephrolithiasis, hydronephrosis, or focal enhancing renal mass. No hydroureter. Partially distended bladder within normal limits. Stomach/Bowel: Stomach within normal limits. No evidence for bowel obstruction. Normal appendix. No acute inflammatory changes seen about the bowels. Single noninflamed diverticulum noted at the descending colon. Vascular/Lymphatic: Normal intravascular enhancement seen throughout the intra-abdominal aorta and its branch vessels. Mild atheromatous plaque about the aortic bifurcation. No aneurysm. No adenopathy. Reproductive: Uterus and ovaries within normal limits. Other: No free air or fluid. Musculoskeletal: No acute osseus abnormality. No worrisome lytic or blastic osseous lesions. IMPRESSION: 1. No CT evidence for acute intra-abdominal or pelvic process. 2. Cholelithiasis. 3. 2.2 cm right adrenal nodule, indeterminate. Follow-up examination with nonemergent adrenal mass protocol CT recommended for further evaluation. Electronically Signed   By: BJeannine BogaM.D.   On: 05/04/2018 04:06   UKoreaAbdomen Limited Ruq  Result Date: 05/04/2018 CLINICAL DATA:  52year old female with right upper quadrant abdominal pain. EXAM: ULTRASOUND ABDOMEN LIMITED RIGHT UPPER QUADRANT COMPARISON:  CT of the abdomen pelvis dated 05/04/2018 FINDINGS: Gallbladder: There multiple stones within the gallbladder. No gallbladder wall thickening or pericholecystic fluid. Negative sonographic Murphy's sign. Common bile duct: Diameter: 3 mm Liver: The liver is unremarkable. Portal vein is patent on color Doppler imaging with normal direction of blood flow towards the liver. IMPRESSION: Cholelithiasis without sonographic evidence of acute cholecystitis. Electronically Signed   By: AAnner CreteM.D.   On: 05/04/2018 05:40     Procedures Dr. TGrandville Silos(05/04/18) - Laparoscopic Cholecystectomy, liver biopsy  Hospital Course:  Jessica EBERis a 89yofemale [facility].  ED workup included CT abdomen/pelvic which showed no acute intra-abdominal process. U/s revealed cholelithiasis  without sonographic evidence of acute cholecystitis. WBC 17.7, AST 19, ALT 20, alk phos 130, total bilirubin 0.3, lipase 31. Patient was admitted and underwent procedure listed above.  Intraoperatively she was found to have cholecystitis with cholelithiasis, multiple small superficial liver lesions. A biopsy was taken of the liver. Tolerated procedure well and was transferred to the floor.  Diet was advanced as tolerated.  On POD1 the patient was voiding well, tolerating diet, ambulating well, pain well controlled, vital signs stable, incisions c/d/i and felt stable for discharge home.  Patient will follow up as below and knows to call with questions or concerns.   Of note, incidental finding 2.2 cm right adrenal nodule seen on CT scan and discussed with patient. She will follow up with PCP for further workup.  I have personally reviewed the patients medication history on the [place] controlled substance database.    Physical Exam: General:  Alert, NAD, comfortable Pulm: effort normal, CTAB Cardio: RRR Abd:  Soft, ND, appropriately tender, multiple lap incisions C/D/I, +BS  Allergies as of 05/05/2018   No Known Allergies     Medication List    STOP taking these medications   oseltamivir 75 MG capsule Commonly known as:  TAMIFLU     TAKE these medications   augmented betamethasone dipropionate 0.05 % cream Commonly known as:  DIPROLENE-AF Apply topically 2 (two) times daily as needed.   buPROPion 300 MG 24 hr tablet Commonly known as:  WELLBUTRIN XL Take 300 mg daily by mouth.   cetirizine 10 MG tablet Commonly known as:  ZYRTEC Take 10 mg by mouth as needed.   Ciclopirox 1 %  shampoo apply 2x/wk x4wk, leave on 25mn, rinse off   drospirenone-ethinyl estradiol 3-0.02 MG tablet Commonly known as:  YAZ,GIANVI,LORYNA Take 1 tablet by mouth daily.   fexofenadine 180 MG tablet Commonly known as:  ALLEGRA Take 180 mg by mouth as needed.   HYDROcodone-acetaminophen 5-325 MG tablet Commonly known as:  NORCO/VICODIN Take 1 tablet by mouth every 6 (six) hours as needed for severe pain.   KLONOPIN PO Take 1 tablet by mouth 2 (two) times daily as needed.   Melatonin 3 MG Tabs Take by mouth at bedtime as needed.   meloxicam 15 MG tablet Commonly known as:  MOBIC Take 1 tablet (15 mg total) by mouth daily. What changed:    when to take this  reasons to take this   montelukast 10 MG tablet Commonly known as:  SINGULAIR Take 1 tablet (10 mg total) by mouth at bedtime.   olopatadine 0.1 % ophthalmic solution Commonly known as:  PATANOL INSTILL 1 DROP INTO BOTH EYES TWICE DAILY. GENERIC EQUIVALENT FOR PATANOL. MUST SEE PROVIDER FOR FUTURE REFILL.   omeprazole 20 MG capsule Commonly known as:  PRILOSEC Take 1 capsule (20 mg total) by mouth every other day.   Phenylephrine HCl 5 MG Tabs Take by mouth as needed.   simvastatin 20 MG tablet Commonly known as:  ZOCOR Take 1 tablet (20 mg total) by mouth at bedtime.   TRINTELLIX 20 MG Tabs tablet Generic drug:  vortioxetine HBr Take 20 mg by mouth daily.        Follow-up Information    BBinnie Rail MD.   Specialty:  Internal Medicine Why:  for followup CT scan of your adrenal glands Contact information: [id number] [place] on 05/18/2018.   Specialty:  General Surgery  Why:  Your appointment is 05/18/18 at 3 pm. Please arrive 30 minutes prior to your appointment to check in and fill out paperwork. Bring photo ID and insurance information. Contact information: [address] [place]  [place] [phone]          Signed: Wellington Hampshire, [facility] 05/05/2018, 9:19 AM Pager: [phone] Consults: [phone] Mon 7:00 am -11:30 AM Tues-Fri 7:00 am-4:30 pm Sat-Sun 7:00 am-11:30 am

## 2018-05-05 NOTE — Plan of Care (Signed)

## 2018-05-11 ENCOUNTER — Encounter: Payer: Self-pay | Admitting: Internal Medicine

## 2018-06-04 NOTE — Patient Instructions (Addendum)
Tests ordered today. Your results will be released to [place] (or called to you) after review, usually within 72hours after test completion. If any changes need to be made, you will be notified at that same time.  All other Health Maintenance issues reviewed.   All recommended immunizations and age-appropriate screenings are up-to-date or discussed.  No immunizations administered today.   Medications reviewed and updated.  Changes include :   none  Your prescription(s) have been submitted to your pharmacy. Please take as directed and contact our office if you believe you are having problem(s) with the medication(s).  A Ct for your adrenal gland was ordered.   Please followup in one year    Health Maintenance, Female Adopting a healthy lifestyle and getting preventive care can go a long way to promote health and wellness. Talk with your health care provider about what schedule of regular examinations is right for you. This is a good chance for you to check in with your provider about disease prevention and staying healthy. In between checkups, there are plenty of things you can do on your own. Experts have done a lot of research about which lifestyle changes and preventive measures are most likely to keep you healthy. Ask your health care provider for more information. Weight and diet Eat a healthy diet  Be sure to include plenty of vegetables, fruits, low-fat dairy products, and lean protein.  Do not eat a lot of foods high in solid fats, added sugars, or salt.  Get regular exercise. This is one of the most important things you can do for your health. ? Most adults should exercise for at least 150 minutes each week. The exercise should increase your heart rate and make you sweat (moderate-intensity exercise). ? Most adults should also do strengthening exercises at least twice a week. This is in addition to the moderate-intensity exercise.  Maintain a healthy weight  Body mass index  (BMI) is a measurement that can be used to identify possible weight problems. It estimates body fat based on height and weight. Your health care provider can help determine your BMI and help you achieve or maintain a healthy weight.  For females 59 years of age and older: ? A BMI below 18.5 is considered underweight. ? A BMI of 18.5 to 24.9 is normal. ? A BMI of 25 to 29.9 is considered overweight. ? A BMI of 30 and above is considered obese.  Watch levels of cholesterol and blood lipids  You should start having your blood tested for lipids and cholesterol at 50 years of age, then have this test every 5 years.  You may need to have your cholesterol levels checked more often if: ? Your lipid or cholesterol levels are high. ? You are older than 50 years of age. ? You are at high risk for heart disease.  Cancer screening Lung Cancer  Lung cancer screening is recommended for adults 66-50 years old who are at high risk for lung cancer because of a history of smoking.  A yearly low-dose CT scan of the lungs is recommended for people who: ? Currently smoke. ? Have quit within the past 15 years. ? Have at least a 30-pack-year history of smoking. A pack year is smoking an average of one pack of cigarettes a day for 1 year.  Yearly screening should continue until it has been 15 years since you quit.  Yearly screening should stop if you develop a health problem that would prevent you from having  lung cancer treatment.  Breast Cancer  Practice breast self-awareness. This means understanding how your breasts normally appear and feel.  It also means doing regular breast self-exams. Let your health care provider know about any changes, no matter how small.  If you are in your 20s or 30s, you should have a clinical breast exam (CBE) by a health care provider every 1-3 years as part of a regular health exam.  If you are 23 or older, have a CBE every year. Also consider having a breast X-ray  (mammogram) every year.  If you have a family history of breast cancer, talk to your health care provider about genetic screening.  If you are at high risk for breast cancer, talk to your health care provider about having an MRI and a mammogram every year.  Breast cancer gene (BRCA) assessment is recommended for women who have family members with BRCA-related cancers. BRCA-related cancers include: ? Breast. ? Ovarian. ? Tubal. ? Peritoneal cancers.  Results of the assessment will determine the need for genetic counseling and BRCA1 and BRCA2 testing.  Cervical Cancer Your health care provider may recommend that you be screened regularly for cancer of the pelvic organs (ovaries, uterus, and vagina). This screening involves a pelvic examination, including checking for microscopic changes to the surface of your cervix (Pap test). You may be encouraged to have this screening done every 3 years, beginning at age 65.  For women ages 32-65, health care providers may recommend pelvic exams and Pap testing every 3 years, or they may recommend the Pap and pelvic exam, combined with testing for human papilloma virus (HPV), every 5 years. Some types of HPV increase your risk of cervical cancer. Testing for HPV may also be done on women of any age with unclear Pap test results.  Other health care providers may not recommend any screening for nonpregnant women who are considered low risk for pelvic cancer and who do not have symptoms. Ask your health care provider if a screening pelvic exam is right for you.  If you have had past treatment for cervical cancer or a condition that could lead to cancer, you need Pap tests and screening for cancer for at least 20 years after your treatment. If Pap tests have been discontinued, your risk factors (such as having a new sexual partner) need to be reassessed to determine if screening should resume. Some women have medical problems that increase the chance of getting  cervical cancer. In these cases, your health care provider may recommend more frequent screening and Pap tests.  Colorectal Cancer  This type of cancer can be detected and often prevented.  Routine colorectal cancer screening usually begins at 50 years of age and continues through 50 years of age.  Your health care provider may recommend screening at an earlier age if you have risk factors for colon cancer.  Your health care provider may also recommend using home test kits to check for hidden blood in the stool.  A small camera at the end of a tube can be used to examine your colon directly (sigmoidoscopy or colonoscopy). This is done to check for the earliest forms of colorectal cancer.  Routine screening usually begins at age 47.  Direct examination of the colon should be repeated every 5-10 years through 50 years of age. However, you may need to be screened more often if early forms of precancerous polyps or small growths are found.  Skin Cancer  Check your skin from head to  toe regularly.  Tell your health care provider about any new moles or changes in moles, especially if there is a change in a mole's shape or color.  Also tell your health care provider if you have a mole that is larger than the size of a pencil eraser.  Always use sunscreen. Apply sunscreen liberally and repeatedly throughout the day.  Protect yourself by wearing long sleeves, pants, a wide-brimmed hat, and sunglasses whenever you are outside.  Heart disease, diabetes, and high blood pressure  High blood pressure causes heart disease and increases the risk of stroke. High blood pressure is more likely to develop in: ? People who have blood pressure in the high end of the normal range (130-139/85-89 mm Hg). ? People who are overweight or obese. ? People who are African American.  If you are 62-3 years of age, have your blood pressure checked every 3-5 years. If you are 19 years of age or older, have your  blood pressure checked every year. You should have your blood pressure measured twice-once when you are at a hospital or clinic, and once when you are not at a hospital or clinic. Record the average of the two measurements. To check your blood pressure when you are not at a hospital or clinic, you can use: ? An automated blood pressure machine at a pharmacy. ? A home blood pressure monitor.  If you are between 6 years and 36 years old, ask your health care provider if you should take aspirin to prevent strokes.  Have regular diabetes screenings. This involves taking a blood sample to check your fasting blood sugar level. ? If you are at a normal weight and have a low risk for diabetes, have this test once every three years after 50 years of age. ? If you are overweight and have a high risk for diabetes, consider being tested at a younger age or more often. Preventing infection Hepatitis B  If you have a higher risk for hepatitis B, you should be screened for this virus. You are considered at high risk for hepatitis B if: ? You were born in a country where hepatitis B is common. Ask your health care provider which countries are considered high risk. ? Your parents were born in a high-risk country, and you have not been immunized against hepatitis B (hepatitis B vaccine). ? You have HIV or AIDS. ? You use needles to inject street drugs. ? You live with someone who has hepatitis B. ? You have had sex with someone who has hepatitis B. ? You get hemodialysis treatment. ? You take certain medicines for conditions, including cancer, organ transplantation, and autoimmune conditions.  Hepatitis C  Blood testing is recommended for: ? Everyone born from [birth year] through 1963. ? Anyone with known risk factors for hepatitis C.  Sexually transmitted infections (STIs)  You should be screened for sexually transmitted infections (STIs) including gonorrhea and chlamydia if: ? You are sexually active and  are younger than 50 years of age. ? You are older than 50 years of age and your health care provider tells you that you are at risk for this type of infection. ? Your sexual activity has changed since you were last screened and you are at an increased risk for chlamydia or gonorrhea. Ask your health care provider if you are at risk.  If you do not have HIV, but are at risk, it may be recommended that you take a prescription medicine daily to prevent HIV infection.  This is called pre-exposure prophylaxis (PrEP). You are considered at risk if: ? You are sexually active and do not regularly use condoms or know the HIV status of your partner(s). ? You take drugs by injection. ? You are sexually active with a partner who has HIV.  Talk with your health care provider about whether you are at high risk of being infected with HIV. If you choose to begin PrEP, you should first be tested for HIV. You should then be tested every 3 months for as long as you are taking PrEP. Pregnancy  If you are premenopausal and you may become pregnant, ask your health care provider about preconception counseling.  If you may become pregnant, take 400 to 800 micrograms (mcg) of folic acid every day.  If you want to prevent pregnancy, talk to your health care provider about birth control (contraception). Osteoporosis and menopause  Osteoporosis is a disease in which the bones lose minerals and strength with aging. This can result in serious bone fractures. Your risk for osteoporosis can be identified using a bone density scan.  If you are 33 years of age or older, or if you are at risk for osteoporosis and fractures, ask your health care provider if you should be screened.  Ask your health care provider whether you should take a calcium or vitamin D supplement to lower your risk for osteoporosis.  Menopause may have certain physical symptoms and risks.  Hormone replacement therapy may reduce some of these symptoms and  risks. Talk to your health care provider about whether hormone replacement therapy is right for you. Follow these instructions at home:  Schedule regular health, dental, and eye exams.  Stay current with your immunizations.  Do not use any tobacco products including cigarettes, chewing tobacco, or electronic cigarettes.  If you are pregnant, do not drink alcohol.  If you are breastfeeding, limit how much and how often you drink alcohol.  Limit alcohol intake to no more than 1 drink per day for nonpregnant women. One drink equals 12 ounces of beer, 5 ounces of wine, or 1 ounces of hard liquor.  Do not use street drugs.  Do not share needles.  Ask your health care provider for help if you need support or information about quitting drugs.  Tell your health care provider if you often feel depressed.  Tell your health care provider if you have ever been abused or do not feel safe at home. This information is not intended to replace advice given to you by your health care provider. Make sure you discuss any questions you have with your health care provider. Document Released: 02/08/2011 Document Revised: 01/01/2016 Document Reviewed: 04/29/2015 Elsevier Interactive Patient Education  [employer].

## 2018-06-04 NOTE — Progress Notes (Signed)
Subjective:    Patient ID: Jessica [name], female    DOB: April 21, 1966, 52 y.o.   MRN: [id number]  HPI She is here for a physical exam.   She had her GB removed one month ago.  She is still trying to figure out what she can eat.  She does have nausea at times and alternates between diarrhea and constipation.  She is trying to eat different things and see what she can and cannot eat.  She does feel very tired and wonders if her thyroid her iron levels are off.  She does get decent sleep and feels her sleep quality is good.  She states she does not snore and her husband has not commented on her sleep.  Medications and allergies reviewed with patient and updated if appropriate.  Patient Active Problem List   Diagnosis Date Noted  . Obesity with body mass index greater than 30 06/02/2017  . GERD (gastroesophageal reflux disease) 09/02/2015  . IBS (irritable bowel syndrome) 05/01/2014  . Nonallopathic lesion of thoracic region 12/24/2013  . Greater trochanteric bursitis of left hip 04/30/2013  . Chronic low back pain 04/23/2013  . Sacroiliac joint dysfunction of left side 04/19/2013  . Greater trochanteric bursitis of right hip 04/11/2013  . Hip flexor tightness 04/11/2013  . Nonallopathic lesion of lumbosacral region 04/11/2013  . Nonallopathic lesion of pelvic region 04/11/2013  . Gait abnormality 05/09/2012  . Depression 04/24/2010  . Allergic rhinitis 12/06/2008  . Thrombocytosis ([place]) 05/07/2008  . 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 Mixed hyperlipidemia 04/22/2008  . INSOMNIA 04/22/2008  . Anxiety state 10/04/2007    Current Outpatient Medications on File Prior to Visit  Medication Sig Dispense Refill  . augmented betamethasone dipropionate (DIPROLENE-AF) 0.05 % cream Apply topically 2 (two) times daily as needed. 150 g 1  . buPROPion (WELLBUTRIN XL) 300 MG 24 hr tablet Take 300 mg daily by mouth.  3  . cetirizine (ZYRTEC) 10 MG tablet Take 10 mg by mouth as needed.       . ClonazePAM (KLONOPIN PO) Take 1 tablet by mouth 2 (two) times daily as needed.    . drospirenone-ethinyl estradiol (YAZ) 3-0.02 MG per tablet Take 1 tablet by mouth daily.      . fexofenadine (ALLEGRA) 180 MG tablet Take 180 mg by mouth as needed.    . Melatonin 3 MG TABS Take by mouth at bedtime as needed.     . meloxicam (MOBIC) 15 MG tablet Take 1 tablet (15 mg total) by mouth daily. (Patient taking differently: Take 15 mg daily as needed by mouth. ) 90 tablet 1  . montelukast (SINGULAIR) 10 MG tablet Take 1 tablet (10 mg total) by mouth at bedtime. 90 tablet 1  . olopatadine (PATANOL) 0.1 % ophthalmic solution INSTILL 1 DROP INTO BOTH EYES TWICE DAILY. GENERIC EQUIVALENT FOR PATANOL. MUST SEE PROVIDER FOR FUTURE REFILL. 20 mL 1  . omeprazole (PRILOSEC) 20 MG capsule Take 1 capsule (20 mg total) by mouth every other day. 90 capsule 1  . Phenylephrine HCl 5 MG TABS Take by mouth as needed.      . simvastatin (ZOCOR) 20 MG tablet Take 1 tablet (20 mg total) by mouth at bedtime. 90 tablet 1  . Vortioxetine HBr (TRINTELLIX) 20 MG TABS Take 20 mg by mouth daily. 30 tablet    No current facility-administered medications on file prior to visit.     Past Medical History:  Diagnosis Date  . ALLERGIC RHINITIS CAUSE UNSPECIFIED   .  ANXIETY DISORDER   . Complication of anesthesia   . CYSTIC ACNE   . DEPRESSION   . DYSPLASTIC NEVUS, CHEST   . ELEVATED BP READING WITHOUT DX HYPERTENSION   . GERD (gastroesophageal reflux disease)   . INSOMNIA   . Mixed hyperlipidemia   . PONV (postoperative nausea and vomiting)   . THROMBOCYTOSIS     Past Surgical History:  Procedure Laterality Date  . BREAST SURGERY  2000   Breast reduction  . CHOLECYSTECTOMY  05/04/2018  . CHOLECYSTECTOMY N/A 05/04/2018   Procedure: LAPAROSCOPIC CHOLECYSTECTOMY;  Surgeon: Georganna Skeans, MD;  Location: [facility];  Service: General;  Laterality: N/A;  . Deviated septum repair  1997    Social History   Socioeconomic  History  . Marital status: Married    Spouse name: paul  . Number of children: Not on file  . Years of education: Not on file  . Highest education level: Not on file  Occupational History  . Not on file  Social Needs  . Financial resource strain: Not on file  . Food insecurity:    Worry: Not on file    Inability: Not on file  . Transportation needs:    Medical: Not on file    Non-medical: Not on file  Tobacco Use  . Smoking status: Never Smoker  . Smokeless tobacco: Never Used  Substance and Sexual Activity  . Alcohol use: No  . Drug use: No  . Sexual activity: Not on file  Lifestyle  . Physical activity:    Days per week: Not on file    Minutes per session: Not on file  . Stress: Not on file  Relationships  . Social connections:    Talks on phone: Not on file    Gets together: Not on file    Attends religious service: Not on file    Active member of club or organization: Not on file    Attends meetings of clubs or organizations: Not on file    Relationship status: Not on file  Other Topics Concern  . Not on file  Social History Narrative   Married lives with spouse and two kids. She is originally from [place] area-moved to [place] 2004    Family History  Problem Relation Age of Onset  . Hyperlipidemia Father   . Hypertension Father   . Depression Sister   . Coronary artery disease Paternal Grandfather     Review of Systems  Constitutional: Negative for chills and fever.  Eyes: Negative for visual disturbance.  Respiratory: Negative for cough, shortness of breath and wheezing.   Cardiovascular: Negative for chest pain, palpitations and leg swelling.  Gastrointestinal: Positive for constipation (occ), diarrhea (occ) and nausea (since GB surgery). Negative for abdominal pain and blood in stool.  Genitourinary: Negative for dysuria and hematuria.  Musculoskeletal: Positive for arthralgias (occ) and back pain (occ if she overdoes it).  Skin: Negative for color change  and rash.  Neurological: Positive for headaches (occ). Negative for light-headedness.  Psychiatric/Behavioral: Positive for dysphoric mood (controlled). The patient is nervous/anxious (controlled).        Objective:   Vitals:   06/05/18 0914  BP: 120/70  Pulse: 84  Resp: 16  Temp: 98.7 F (37.1 C)  SpO2: 97%   Filed Weights   06/05/18 0914  Weight: 193 lb 12.8 oz (87.9 kg)   Body mass index is 35.45 kg/m.  Wt Readings from Last 3 Encounters:  06/05/18 193 lb 12.8 oz (87.9 kg)  05/04/18  190 lb (86.2 kg)  06/18/17 192 lb (87.1 kg)     Physical Exam Constitutional: She appears well-developed and well-nourished. No distress.  HENT:  Head: Normocephalic and atraumatic.  Right Ear: External ear normal. Normal ear canal and TM Left Ear: External ear normal.  Normal ear canal and TM Mouth/Throat: Oropharynx is clear and moist.  Eyes: Conjunctivae and EOM are normal.  Neck: Neck supple. No tracheal deviation present. No thyromegaly present.  No carotid bruit  Cardiovascular: Normal rate, regular rhythm and normal heart sounds.   No murmur heard.  No edema. Pulmonary/Chest: Effort normal and breath sounds normal. No respiratory distress. She has no wheezes. She has no rales.  Breast: deferred to Gyn Abdominal: Soft. She exhibits no distension. There is no tenderness.  Lymphadenopathy: She has no cervical adenopathy.  Skin: Skin is warm and dry. She is not diaphoretic.  Psychiatric: She has a normal mood and affect. Her behavior is normal.        Assessment & Plan:   Physical exam: Screening blood work  ordered Immunizations  Td and flu up to date,  Discussed shingrix Colonoscopy   - due - wants to hold off for now Mammogram  Done at [facility]   06/13/17 Gyn  Up to date - Dr Melba Coon Eye exams   Up to date  EKG   Done 11/2011 Exercise   Not currently exercise - just had surgery - usually does Walking, zumba, pilates - will restart Weight  encouraged weight loss - trying  to lose weight Skin  No concerns, sees derm Substance abuse   none  See Problem List for Assessment and Plan of chronic medical problems.    Follow-up in 1 year, sooner if needed

## 2018-06-05 ENCOUNTER — Encounter: Payer: BLUE CROSS/BLUE SHIELD | Admitting: Internal Medicine

## 2018-06-05 ENCOUNTER — Encounter: Payer: Self-pay | Admitting: Internal Medicine

## 2018-06-05 ENCOUNTER — Ambulatory Visit (INDEPENDENT_AMBULATORY_CARE_PROVIDER_SITE_OTHER): Payer: No Typology Code available for payment source | Admitting: Internal Medicine

## 2018-06-05 ENCOUNTER — Other Ambulatory Visit (INDEPENDENT_AMBULATORY_CARE_PROVIDER_SITE_OTHER): Payer: No Typology Code available for payment source

## 2018-06-05 VITALS — BP 120/70 | HR 84 | Temp 98.7°F | Resp 16 | Ht 62.0 in | Wt 193.8 lb

## 2018-06-05 DIAGNOSIS — R739 Hyperglycemia, unspecified: Secondary | ICD-10-CM | POA: Diagnosis not present

## 2018-06-05 DIAGNOSIS — F411 Generalized anxiety disorder: Secondary | ICD-10-CM | POA: Diagnosis not present

## 2018-06-05 DIAGNOSIS — E782 Mixed hyperlipidemia: Secondary | ICD-10-CM

## 2018-06-05 DIAGNOSIS — D473 Essential (hemorrhagic) thrombocythemia: Secondary | ICD-10-CM

## 2018-06-05 DIAGNOSIS — R7303 Prediabetes: Secondary | ICD-10-CM | POA: Insufficient documentation

## 2018-06-05 DIAGNOSIS — F329 Major depressive disorder, single episode, unspecified: Secondary | ICD-10-CM

## 2018-06-05 DIAGNOSIS — K219 Gastro-esophageal reflux disease without esophagitis: Secondary | ICD-10-CM

## 2018-06-05 DIAGNOSIS — Z0000 Encounter for general adult medical examination without abnormal findings: Secondary | ICD-10-CM

## 2018-06-05 DIAGNOSIS — E278 Other specified disorders of adrenal gland: Secondary | ICD-10-CM | POA: Insufficient documentation

## 2018-06-05 DIAGNOSIS — D75839 Thrombocytosis, unspecified: Secondary | ICD-10-CM

## 2018-06-05 DIAGNOSIS — R5383 Other fatigue: Secondary | ICD-10-CM

## 2018-06-05 DIAGNOSIS — E669 Obesity, unspecified: Secondary | ICD-10-CM

## 2018-06-05 DIAGNOSIS — L309 Dermatitis, unspecified: Secondary | ICD-10-CM | POA: Insufficient documentation

## 2018-06-05 DIAGNOSIS — F32A Depression, unspecified: Secondary | ICD-10-CM

## 2018-06-05 DIAGNOSIS — E279 Disorder of adrenal gland, unspecified: Secondary | ICD-10-CM

## 2018-06-05 LAB — TSH: TSH: 2.32 u[IU]/mL (ref 0.35–4.50)

## 2018-06-05 LAB — COMPREHENSIVE METABOLIC PANEL
ALBUMIN: 3.5 g/dL (ref 3.5–5.2)
ALT: 16 U/L (ref 0–35)
AST: 13 U/L (ref 0–37)
Alkaline Phosphatase: 130 U/L — ABNORMAL HIGH (ref 39–117)
BILIRUBIN TOTAL: 0.3 mg/dL (ref 0.2–1.2)
BUN: 11 mg/dL (ref 6–23)
CHLORIDE: 104 meq/L (ref 96–112)
CO2: 26 mEq/L (ref 19–32)
Calcium: 8.7 mg/dL (ref 8.4–10.5)
Creatinine, Ser: 0.62 mg/dL (ref 0.40–1.20)
GFR: 108.25 mL/min (ref >60.00)
Glucose, Bld: 84 mg/dL (ref 70–99)
POTASSIUM: 3.8 meq/L (ref 3.5–5.1)
SODIUM: 137 meq/L (ref 135–145)
Total Protein: 6.9 g/dL (ref 6.0–8.3)

## 2018-06-05 LAB — CBC WITH DIFFERENTIAL/PLATELET
BASOS ABS: 0 10*3/uL (ref 0.0–0.1)
Basophils Relative: 0.2 % (ref 0.0–3.0)
Eosinophils Absolute: 0.2 10*3/uL (ref 0.0–0.7)
Eosinophils Relative: 2.4 % (ref 0.0–5.0)
HEMATOCRIT: 36.3 % (ref 36.0–46.0)
Hemoglobin: 12.4 g/dL (ref 12.0–15.0)
LYMPHS PCT: 26 % (ref 12.0–46.0)
Lymphs Abs: 2.4 10*3/uL (ref 0.7–4.0)
MCHC: 34.1 g/dL (ref 30.0–36.0)
MCV: 82.1 fl (ref 78.0–100.0)
Monocytes Absolute: 0.9 10*3/uL (ref 0.1–1.0)
Monocytes Relative: 9.4 % (ref 3.0–12.0)
NEUTROS ABS: 5.6 10*3/uL (ref 1.4–7.7)
NEUTROS PCT: 62 % (ref 43.0–77.0)
PLATELETS: 418 10*3/uL — AB (ref 150.0–400.0)
RBC: 4.42 Mil/uL (ref 3.87–5.11)
RDW: 13.2 % (ref 11.5–15.5)
WBC: 9.1 10*3/uL (ref 4.0–10.5)

## 2018-06-05 LAB — FERRITIN: Ferritin: 48.8 ng/mL (ref 10.0–291.0)

## 2018-06-05 LAB — LIPID PANEL
Cholesterol: 202 mg/dL — ABNORMAL HIGH (ref 0–200)
HDL: 61 mg/dL (ref >39.00)
NONHDL: 140.61
Total CHOL/HDL Ratio: 3
Triglycerides: 386 mg/dL — ABNORMAL HIGH (ref 0.0–149.0)
VLDL: 77.2 mg/dL — AB (ref 0.0–40.0)

## 2018-06-05 LAB — IRON: Iron: 42 ug/dL (ref 42–145)

## 2018-06-05 LAB — LDL CHOLESTEROL, DIRECT: Direct LDL: 87 mg/dL

## 2018-06-05 LAB — HEMOGLOBIN A1C: HEMOGLOBIN A1C: 5.3 % (ref 4.6–6.5)

## 2018-06-05 NOTE — Assessment & Plan Note (Addendum)
Controlled per patient Management per Psych

## 2018-06-05 NOTE — Assessment & Plan Note (Signed)
GERD controlled Continue daily medication  

## 2018-06-05 NOTE — Assessment & Plan Note (Signed)
?    Cause Will be checking blood work today including CBC, TSH and iron levels She states her sleep is reasonably good-does not snore and her husband has never commented that she stops breathing while sleeping Will be starting regular exercise soon, which may help

## 2018-06-05 NOTE — Assessment & Plan Note (Signed)
Adrenal nodule seen on CT from last month Radiology recommended specific CT for adrenal gland-ordered

## 2018-06-05 NOTE — Assessment & Plan Note (Signed)
Sees derm

## 2018-06-05 NOTE — Assessment & Plan Note (Signed)
a1c

## 2018-06-05 NOTE — Assessment & Plan Note (Signed)
Check lipid panel  Continue daily statin Regular exercise and healthy diet encouraged  

## 2018-06-05 NOTE — Assessment & Plan Note (Signed)
cbc

## 2018-06-05 NOTE — Assessment & Plan Note (Signed)
Advised weight loss Stressed regular exercise Small portions, healthy diet Follow up as needed

## 2018-06-08 ENCOUNTER — Other Ambulatory Visit: Payer: Self-pay | Admitting: Internal Medicine

## 2018-06-09 ENCOUNTER — Encounter: Payer: Self-pay | Admitting: Internal Medicine

## 2018-06-10 ENCOUNTER — Encounter: Payer: Self-pay | Admitting: Internal Medicine

## 2018-06-11 ENCOUNTER — Other Ambulatory Visit: Payer: Self-pay | Admitting: Internal Medicine

## 2018-06-13 ENCOUNTER — Inpatient Hospital Stay: Admission: RE | Admit: 2018-06-13 | Payer: No Typology Code available for payment source | Source: Ambulatory Visit

## 2018-06-13 ENCOUNTER — Encounter: Payer: Self-pay | Admitting: Internal Medicine

## 2018-06-29 ENCOUNTER — Other Ambulatory Visit: Payer: Self-pay | Admitting: Internal Medicine

## 2018-06-29 ENCOUNTER — Ambulatory Visit (INDEPENDENT_AMBULATORY_CARE_PROVIDER_SITE_OTHER)
Admission: RE | Admit: 2018-06-29 | Discharge: 2018-06-29 | Disposition: A | Discharge status: Home / Self Care | Payer: No Typology Code available for payment source | Source: Ambulatory Visit | Attending: Internal Medicine | Admitting: Internal Medicine

## 2018-06-29 DIAGNOSIS — E278 Other specified disorders of adrenal gland: Secondary | ICD-10-CM

## 2018-06-29 DIAGNOSIS — E279 Disorder of adrenal gland, unspecified: Secondary | ICD-10-CM | POA: Diagnosis not present

## 2018-07-01 ENCOUNTER — Encounter: Payer: Self-pay | Admitting: Internal Medicine

## 2018-08-16 ENCOUNTER — Encounter: Payer: Self-pay | Admitting: Internal Medicine

## 2018-11-25 ENCOUNTER — Other Ambulatory Visit: Payer: Self-pay | Admitting: Internal Medicine

## 2018-11-28 ENCOUNTER — Other Ambulatory Visit: Payer: Self-pay | Admitting: Internal Medicine

## 2019-05-14 ENCOUNTER — Other Ambulatory Visit: Payer: Self-pay | Admitting: Internal Medicine

## 2019-05-17 ENCOUNTER — Other Ambulatory Visit: Payer: Self-pay | Admitting: Internal Medicine

## 2019-05-27 ENCOUNTER — Other Ambulatory Visit: Payer: Self-pay | Admitting: Internal Medicine

## 2019-05-28 NOTE — Telephone Encounter (Signed)
Ok to fill 

## 2019-06-06 NOTE — Progress Notes (Signed)
Subjective:    Patient ID: Jessica [name], female    DOB: May 20, 1966, 53 y.o.   MRN: [id number]  HPI She is here for a physical exam.   She has no major concerns or questions.  Medications and allergies reviewed with patient and updated if appropriate.  Patient Active Problem List   Diagnosis Date Noted  . Eczema 06/05/2018  . Hyperglycemia 06/05/2018  . Adrenal nodule ([place]) 06/05/2018  . Fatigue 06/05/2018  . Obesity with body mass index greater than 30 06/02/2017  . GERD (gastroesophageal reflux disease) 09/02/2015  . IBS (irritable bowel syndrome) 05/01/2014  . Nonallopathic lesion of thoracic region 12/24/2013  . Greater trochanteric bursitis of left hip 04/30/2013  . Chronic low back pain 04/23/2013  . Sacroiliac joint dysfunction of left side 04/19/2013  . Greater trochanteric bursitis of right hip 04/11/2013  . Hip flexor tightness 04/11/2013  . Nonallopathic lesion of lumbosacral region 04/11/2013  . Nonallopathic lesion of pelvic region 04/11/2013  . Gait abnormality 05/09/2012  . Depression 04/24/2010  . Allergic rhinitis 12/06/2008  . Thrombocytosis ([place]) 05/07/2008  . 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 Mixed hyperlipidemia 04/22/2008  . INSOMNIA 04/22/2008  . Anxiety state 10/04/2007    Current Outpatient Medications on File Prior to Visit  Medication Sig Dispense Refill  . augmented betamethasone dipropionate (DIPROLENE-AF) 0.05 % cream Apply topically 2 (two) times daily as needed. 150 g 1  . betamethasone dipropionate 0.05 % cream     . buPROPion (WELLBUTRIN XL) 300 MG 24 hr tablet Take 300 mg daily by mouth.  3  . cetirizine (ZYRTEC) 10 MG tablet Take 10 mg by mouth as needed.      . Ciclopirox 1 % shampoo ciclopirox 1 % shampoo    . ClonazePAM (KLONOPIN PO) Take 1 tablet by mouth 2 (two) times daily as needed.    . drospirenone-ethinyl estradiol (YAZ) 3-0.02 MG per tablet Take 1 tablet by mouth daily.      . fexofenadine (ALLEGRA) 180 MG  tablet Take 180 mg by mouth as needed.    Marland Kitchen KLONOPIN 0.5 MG tablet Take 1 tablet by mouth twice a day as needed TKE 1/2-1 AS NEEDED    . Melatonin 3 MG TABS Take by mouth at bedtime as needed.     . meloxicam (MOBIC) 15 MG tablet Take 1 tablet (15 mg total) by mouth daily. (Patient taking differently: Take 15 mg daily as needed by mouth. ) 90 tablet 1  . montelukast (SINGULAIR) 10 MG tablet TAKE 1 TABLET BY MOUTH AT  BEDTIME 90 tablet 3  . olopatadine (PATANOL) 0.1 % ophthalmic solution INSTILL 1 DROP INTO BOTH  EYES TWICE DAILY. 15 mL 5  . omeprazole (PRILOSEC) 20 MG capsule Take 1 capsule (20 mg total) by mouth every other day. **NEEDS OFFICE VISIT FOR 1 YEAR SUPPLY** 45 capsule 0  . Phenylephrine HCl 5 MG TABS Take by mouth as needed.      . raNITIdine HCl (WAL-ZAN 75 PO) Zantac    . simvastatin (ZOCOR) 20 MG tablet Take 1 tablet (20 mg total) by mouth at bedtime. **NEEDS OFFICE VISIT FOR 1 YEAR SUPPLY** 90 tablet 0  . vortioxetine HBr (TRINTELLIX) 20 MG TABS tablet Trintellix 20 mg tablet  TK 1 T PO D    . simvastatin (ZOCOR) 20 MG tablet simvastatin 20 mg tabs     No current facility-administered medications on file prior to visit.     Past Medical History:  Diagnosis Date  .  ALLERGIC RHINITIS CAUSE UNSPECIFIED   . ANXIETY DISORDER   . Complication of anesthesia   . CYSTIC ACNE   . DEPRESSION   . DYSPLASTIC NEVUS, CHEST   . ELEVATED BP READING WITHOUT DX HYPERTENSION   . GERD (gastroesophageal reflux disease)   . INSOMNIA   . Mixed hyperlipidemia   . PONV (postoperative nausea and vomiting)   . THROMBOCYTOSIS     Past Surgical History:  Procedure Laterality Date  . BREAST SURGERY  2000   Breast reduction  . CHOLECYSTECTOMY  05/04/2018  . CHOLECYSTECTOMY N/A 05/04/2018   Procedure: LAPAROSCOPIC CHOLECYSTECTOMY;  Surgeon: Georganna Skeans, MD;  Location: [facility];  Service: General;  Laterality: N/A;  . Deviated septum repair  1997    Social History   Socioeconomic History   . Marital status: Married    Spouse name: paul  . Number of children: Not on file  . Years of education: Not on file  . Highest education level: Not on file  Occupational History  . Not on file  Social Needs  . Financial resource strain: Not on file  . Food insecurity    Worry: Not on file    Inability: Not on file  . Transportation needs    Medical: Not on file    Non-medical: Not on file  Tobacco Use  . Smoking status: Never Smoker  . Smokeless tobacco: Never Used  Substance and Sexual Activity  . Alcohol use: No  . Drug use: No  . Sexual activity: Not on file  Lifestyle  . Physical activity    Days per week: Not on file    Minutes per session: Not on file  . Stress: Not on file  Relationships  . Social Herbalist on phone: Not on file    Gets together: Not on file    Attends religious service: Not on file    Active member of club or organization: Not on file    Attends meetings of clubs or organizations: Not on file    Relationship status: Not on file  Other Topics Concern  . Not on file  Social History Narrative   Married lives with spouse and two kids. She is originally from [place] area-moved to [place] 2004    Family History  Problem Relation Age of Onset  . Hyperlipidemia Father   . Hypertension Father   . Depression Sister   . Coronary artery disease Paternal Grandfather     Review of Systems  Constitutional: Negative for chills and fever.  Eyes: Negative for visual disturbance.  Respiratory: Negative for cough, shortness of breath and wheezing.   Cardiovascular: Negative for chest pain, palpitations and leg swelling.  Gastrointestinal: Positive for diarrhea (frequent, chronic) and nausea (first thing in the morning). Negative for abdominal pain, blood in stool and constipation.  Genitourinary: Negative for dysuria and hematuria.  Musculoskeletal: Negative for arthralgias and back pain.  Skin: Negative for color change and rash.  Neurological:  Negative for light-headedness and headaches.  Psychiatric/Behavioral: Positive for dysphoric mood (controlled). The patient is nervous/anxious (controlled).        Objective:   Vitals:   06/07/19 0911  BP: 124/78  Pulse: 90  Temp: 98.5 F (36.9 C)  SpO2: 99%   Filed Weights   06/07/19 0911  Weight: 198 lb (89.8 kg)   Body mass index is 36.21 kg/m.  BP Readings from Last 3 Encounters:  06/07/19 124/78  06/05/18 120/70  05/05/18 120/68    Wt Readings  from Last 3 Encounters:  06/07/19 198 lb (89.8 kg)  06/05/18 193 lb 12.8 oz (87.9 kg)  05/04/18 190 lb (86.2 kg)     Physical Exam Constitutional: She appears well-developed and well-nourished. No distress.  HENT:  Head: Normocephalic and atraumatic.  Right Ear: External ear normal. Normal ear canal and TM Left Ear: External ear normal.  Normal ear canal and TM Mouth/Throat: Oropharynx is clear and moist.  Eyes: Conjunctivae and EOM are normal.  Neck: Neck supple. No tracheal deviation present. No thyromegaly present.  No carotid bruit  Cardiovascular: Normal rate, regular rhythm and normal heart sounds.   No murmur heard.  No edema. Pulmonary/Chest: Effort normal and breath sounds normal. No respiratory distress. She has no wheezes. She has no rales.  Breast: deferred   Abdominal: Soft. She exhibits no distension. There is no tenderness.  Lymphadenopathy: She has no cervical adenopathy.  Skin: Skin is warm and dry. She is not diaphoretic.  Psychiatric: She has a normal mood and affect. Her behavior is normal.        Assessment & Plan:   Physical exam: Screening blood work    ordered Immunizations  Flu vaccine up to date, tdap today Colonoscopy   - deferred - will do cologuard Mammogram  Up to date  Gyn    Up to date  Eye exams  Up to date  Exercise     walking Weight      Working on weight loss Substance abuse    none Sees derm annually   See Problem List for Assessment and Plan of chronic medical  problems.   Follow-up in 1 year

## 2019-06-06 NOTE — Patient Instructions (Addendum)
Tests ordered today. Your results will be released to Bernice (or called to you) after review.  If any changes need to be made, you will be notified at that same time.  All other Health Maintenance issues reviewed.   All recommended immunizations and age-appropriate screenings are up-to-date or discussed.  Tetanus immunization administered today.   Medications reviewed and updated.  Changes include :   none    Please followup in 1 year    Health Maintenance, Female Adopting a healthy lifestyle and getting preventive care are important in promoting health and wellness. Ask your health care provider about:  The right schedule for you to have regular tests and exams.  Things you can do on your own to prevent diseases and keep yourself healthy. What should I know about diet, weight, and exercise? Eat a healthy diet   Eat a diet that includes plenty of vegetables, fruits, low-fat dairy products, and lean protein.  Do not eat a lot of foods that are high in solid fats, added sugars, or sodium. Maintain a healthy weight Body mass index (BMI) is used to identify weight problems. It estimates body fat based on height and weight. Your health care provider can help determine your BMI and help you achieve or maintain a healthy weight. Get regular exercise Get regular exercise. This is one of the most important things you can do for your health. Most adults should:  Exercise for at least 150 minutes each week. The exercise should increase your heart rate and make you sweat (moderate-intensity exercise).  Do strengthening exercises at least twice a week. This is in addition to the moderate-intensity exercise.  Spend less time sitting. Even light physical activity can be beneficial. Watch cholesterol and blood lipids Have your blood tested for lipids and cholesterol at 51 years of age, then have this test every 5 years. Have your cholesterol levels checked more often if:  Your lipid or  cholesterol levels are high.  You are older than 51 years of age.  You are at high risk for heart disease. What should I know about cancer screening? Depending on your health history and family history, you may need to have cancer screening at various ages. This may include screening for:  Breast cancer.  Cervical cancer.  Colorectal cancer.  Skin cancer.  Lung cancer. What should I know about heart disease, diabetes, and high blood pressure? Blood pressure and heart disease  High blood pressure causes heart disease and increases the risk of stroke. This is more likely to develop in people who have high blood pressure readings, are of African descent, or are overweight.  Have your blood pressure checked: ? Every 3-5 years if you are 9-38 years of age. ? Every year if you are 46 years old or older. Diabetes Have regular diabetes screenings. This checks your fasting blood sugar level. Have the screening done:  Once every three years after age 22 if you are at a normal weight and have a low risk for diabetes.  More often and at a younger age if you are overweight or have a high risk for diabetes. What should I know about preventing infection? Hepatitis B If you have a higher risk for hepatitis B, you should be screened for this virus. Talk with your health care provider to find out if you are at risk for hepatitis B infection. Hepatitis C Testing is recommended for:  Everyone born from [birth year] through 1963.  Anyone with known risk factors for hepatitis C.  Sexually transmitted infections (STIs)  Get screened for STIs, including gonorrhea and chlamydia, if: ? You are sexually active and are younger than 51 years of age. ? You are older than 51 years of age and your health care provider tells you that you are at risk for this type of infection. ? Your sexual activity has changed since you were last screened, and you are at increased risk for chlamydia or gonorrhea. Ask your  health care provider if you are at risk.  Ask your health care provider about whether you are at high risk for HIV. Your health care provider may recommend a prescription medicine to help prevent HIV infection. If you choose to take medicine to prevent HIV, you should first get tested for HIV. You should then be tested every 3 months for as long as you are taking the medicine. Pregnancy  If you are about to stop having your period (premenopausal) and you may become pregnant, seek counseling before you get pregnant.  Take 400 to 800 micrograms (mcg) of folic acid every day if you become pregnant.  Ask for birth control (contraception) if you want to prevent pregnancy. Osteoporosis and menopause Osteoporosis is a disease in which the bones lose minerals and strength with aging. This can result in bone fractures. If you are 65 years old or older, or if you are at risk for osteoporosis and fractures, ask your health care provider if you should:  Be screened for bone loss.  Take a calcium or vitamin D supplement to lower your risk of fractures.  Be given hormone replacement therapy (HRT) to treat symptoms of menopause. Follow these instructions at home: Lifestyle  Do not use any products that contain nicotine or tobacco, such as cigarettes, e-cigarettes, and chewing tobacco. If you need help quitting, ask your health care provider.  Do not use street drugs.  Do not share needles.  Ask your health care provider for help if you need support or information about quitting drugs. Alcohol use  Do not drink alcohol if: ? Your health care provider tells you not to drink. ? You are pregnant, may be pregnant, or are planning to become pregnant.  If you drink alcohol: ? Limit how much you use to 0-1 drink a day. ? Limit intake if you are breastfeeding.  Be aware of how much alcohol is in your drink. In the [place] one drink equals one 12 oz bottle of beer (355 mL), one 5 oz glass of wine (148  mL), or one 1 oz glass of hard liquor (44 mL). General instructions  Schedule regular health, dental, and eye exams.  Stay current with your vaccines.  Tell your health care provider if: ? You often feel depressed. ? You have ever been abused or do not feel safe at home. Summary  Adopting a healthy lifestyle and getting preventive care are important in promoting health and wellness.  Follow your health care provider's instructions about healthy diet, exercising, and getting tested or screened for diseases.  Follow your health care provider's instructions on monitoring your cholesterol and blood pressure. This information is not intended to replace advice given to you by your health care provider. Make sure you discuss any questions you have with your health care provider. Document Released: 02/08/2011 Document Revised: 07/19/2018 Document Reviewed: 07/19/2018 Elsevier Patient Education  2020 Elsevier Inc.  

## 2019-06-07 ENCOUNTER — Encounter: Payer: Self-pay | Admitting: Internal Medicine

## 2019-06-07 ENCOUNTER — Ambulatory Visit (INDEPENDENT_AMBULATORY_CARE_PROVIDER_SITE_OTHER): Payer: No Typology Code available for payment source | Admitting: Internal Medicine

## 2019-06-07 ENCOUNTER — Other Ambulatory Visit: Payer: Self-pay

## 2019-06-07 VITALS — BP 124/78 | HR 90 | Temp 98.5°F | Ht 62.0 in | Wt 198.0 lb

## 2019-06-07 DIAGNOSIS — J301 Allergic rhinitis due to pollen: Secondary | ICD-10-CM

## 2019-06-07 DIAGNOSIS — F411 Generalized anxiety disorder: Secondary | ICD-10-CM

## 2019-06-07 DIAGNOSIS — Z23 Encounter for immunization: Secondary | ICD-10-CM

## 2019-06-07 DIAGNOSIS — R739 Hyperglycemia, unspecified: Secondary | ICD-10-CM | POA: Diagnosis not present

## 2019-06-07 DIAGNOSIS — F329 Major depressive disorder, single episode, unspecified: Secondary | ICD-10-CM

## 2019-06-07 DIAGNOSIS — K219 Gastro-esophageal reflux disease without esophagitis: Secondary | ICD-10-CM

## 2019-06-07 DIAGNOSIS — F32A Depression, unspecified: Secondary | ICD-10-CM

## 2019-06-07 DIAGNOSIS — E782 Mixed hyperlipidemia: Secondary | ICD-10-CM

## 2019-06-07 DIAGNOSIS — Z0000 Encounter for general adult medical examination without abnormal findings: Secondary | ICD-10-CM

## 2019-06-07 DIAGNOSIS — E669 Obesity, unspecified: Secondary | ICD-10-CM

## 2019-06-07 NOTE — Assessment & Plan Note (Signed)
Per psych 

## 2019-06-07 NOTE — Assessment & Plan Note (Signed)
Taking Singulair at night and xyzal Taking allegra daily in morning Can not take flonase Fairly controlled Continue above

## 2019-06-07 NOTE — Assessment & Plan Note (Signed)
Continue regular exercise Working on weight loss

## 2019-06-07 NOTE — Assessment & Plan Note (Signed)
Check lipid panel,cmp ,tsh Continue daily statin Regular exercise and healthy diet encouraged  

## 2019-06-07 NOTE — Assessment & Plan Note (Addendum)
Alternated pepcid and omeprazole GERD controlled Continue daily medication

## 2019-06-07 NOTE — Assessment & Plan Note (Signed)
Check a1c Low sugar / carb diet Stressed regular exercise   

## 2019-06-14 ENCOUNTER — Telehealth: Payer: Self-pay

## 2019-06-14 NOTE — Telephone Encounter (Signed)
-----   Message from Binnie Rail, MD sent at 06/07/2019  9:47 AM EDT ----- Please have a cologuard sent to her.  Thanks.

## 2019-06-14 NOTE — Telephone Encounter (Signed)
Done

## 2019-06-26 ENCOUNTER — Encounter: Payer: Self-pay | Admitting: Internal Medicine

## 2019-06-26 MED ORDER — PSEUDOEPHEDRINE HCL ER 120 MG PO TB12: 120.0000 mg | ORAL_TABLET | Freq: Two times a day (BID) | ORAL | 5 refills | Status: DC | PRN

## 2019-07-01 ENCOUNTER — Other Ambulatory Visit: Payer: Self-pay | Admitting: Cardiology

## 2019-07-01 DIAGNOSIS — Z20822 Contact with and (suspected) exposure to covid-19: Secondary | ICD-10-CM

## 2019-07-02 LAB — NOVEL CORONAVIRUS, NAA: SARS-CoV-2, NAA: NOT DETECTED

## 2019-08-06 ENCOUNTER — Other Ambulatory Visit: Payer: Self-pay | Admitting: Internal Medicine

## 2019-08-23 ENCOUNTER — Encounter: Payer: Self-pay | Admitting: Internal Medicine

## 2019-08-23 MED ORDER — PSEUDOEPHEDRINE HCL ER 120 MG PO TB12: 120.0000 mg | ORAL_TABLET | Freq: Two times a day (BID) | ORAL | 0 refills | Status: DC | PRN

## 2019-08-23 NOTE — Telephone Encounter (Signed)
Resent rx to [facility] per pt request../lmb

## 2019-08-28 ENCOUNTER — Other Ambulatory Visit: Payer: Self-pay

## 2019-08-28 ENCOUNTER — Ambulatory Visit: Payer: No Typology Code available for payment source | Admitting: Family Medicine

## 2019-08-28 ENCOUNTER — Encounter: Payer: Self-pay | Admitting: Family Medicine

## 2019-08-28 DIAGNOSIS — M216X9 Other acquired deformities of unspecified foot: Secondary | ICD-10-CM | POA: Insufficient documentation

## 2019-08-28 DIAGNOSIS — M216X1 Other acquired deformities of right foot: Secondary | ICD-10-CM | POA: Diagnosis not present

## 2019-08-28 NOTE — Progress Notes (Signed)
[facility] [place] Phone: [phone] Subjective:   Jessica [name], am serving as a scribe for Dr. Hulan Saas. This visit occurred during the SARS-CoV-2 public health emergency.  Safety protocols were in place, including screening questions prior to the visit, additional usage of staff PPE, and extensive cleaning of exam room while observing appropriate contact time as indicated for disinfecting solutions.   I'm seeing this patient by the request  of:  Binnie Rail, MD  CC: Foot pain follow-up  [id number]  Jessica [name] is a 54 y.o. female coming in with complaint of bilateral foot pain. States that she has been doing a lot of walking on sidewalks for exercise. Walks around her house and stands for work. Does wear shoes in the house. Has [employer] orthotics that were made around 4-5 years ago.       Past Medical History:  Diagnosis Date  . ALLERGIC RHINITIS CAUSE UNSPECIFIED   . ANXIETY DISORDER   . Complication of anesthesia   . CYSTIC ACNE   . DEPRESSION   . DYSPLASTIC NEVUS, CHEST   . ELEVATED BP READING WITHOUT DX HYPERTENSION   . GERD (gastroesophageal reflux disease)   . INSOMNIA   . Mixed hyperlipidemia   . PONV (postoperative nausea and vomiting)   . THROMBOCYTOSIS    Past Surgical History:  Procedure Laterality Date  . BREAST SURGERY  2000   Breast reduction  . CHOLECYSTECTOMY  05/04/2018  . CHOLECYSTECTOMY N/A 05/04/2018   Procedure: LAPAROSCOPIC CHOLECYSTECTOMY;  Surgeon: Georganna Skeans, MD;  Location: [facility];  Service: General;  Laterality: N/A;  . Deviated septum repair  1997   Social History   Socioeconomic History  . Marital status: Married    Spouse name: paul  . Number of children: Not on file  . Years of education: Not on file  . Highest education level: Not on file  Occupational History  . Not on file  Tobacco Use  . Smoking status: Never Smoker  . Smokeless tobacco:  Never Used  Substance and Sexual Activity  . Alcohol use: [name]  . Drug use: [name]  . Sexual activity: Not on file  Other Topics Concern  . Not on file  Social History Narrative   Married lives with spouse and two kids. She is originally from [place] to [place]   Social Determinants of Health   Financial Resource Strain:   . Difficulty of Paying Living Expenses: Not on file  Food Insecurity:   . Worried About Charity fundraiser in the Last Year: Not on file  . Ran Out of Food in the Last Year: Not on file  Transportation Needs:   . Lack of Transportation (Medical): Not on file  . Lack of Transportation (Non-Medical): Not on file  Physical Activity:   . Days of Exercise per Week: Not on file  . Minutes of Exercise per Session: Not on file  Stress:   . Feeling of Stress : Not on file  Social Connections:   . Frequency of Communication with Friends and Family: Not on file  . Frequency of Social Gatherings with Friends and Family: Not on file  . Attends Religious Services: Not on file  . Active Member of Clubs or Organizations: Not on file  . Attends Archivist Meetings: Not on file  . Marital Status: Not on file   [name] Family History  Problem Relation Age of Onset  .  Hyperlipidemia Father   . Hypertension Father   . Depression Sister   . Coronary artery disease Paternal Grandfather     Current Outpatient Medications (Endocrine & Metabolic):  .  drospirenone-ethinyl estradiol (YAZ) 3-0.02 MG per tablet, Take 1 tablet by mouth daily.    Current Outpatient Medications (Cardiovascular):  .  simvastatin (ZOCOR) 20 MG tablet, TAKE 1 TABLET BY MOUTH AT  BEDTIME  Current Outpatient Medications (Respiratory):  .  cetirizine (ZYRTEC) 10 MG tablet, Take 10 mg by mouth as needed.   .  fexofenadine (ALLEGRA) 180 MG tablet, Take 180 mg by mouth as needed. .  montelukast (SINGULAIR) 10 MG tablet, TAKE 1 TABLET BY MOUTH AT  BEDTIME .  Phenylephrine HCl 5  MG TABS, Take by mouth as needed.   .  pseudoephedrine (SUDAFED) 120 MG 12 hr tablet, Take 1 tablet (120 mg total) by mouth 2 (two) times daily as needed for congestion.  Current Outpatient Medications (Analgesics):  .  meloxicam (MOBIC) 15 MG tablet, Take 1 tablet (15 mg total) by mouth daily. (Patient taking differently: Take 15 mg daily as needed by mouth. )   Current Outpatient Medications (Other):  .  augmented betamethasone dipropionate (DIPROLENE-AF) 0.05 % cream, Apply topically 2 (two) times daily as needed. .  betamethasone dipropionate 0.05 % cream,  .  buPROPion (WELLBUTRIN XL) 300 MG 24 hr tablet, Take 300 mg daily by mouth. .  Ciclopirox 1 % shampoo, ciclopirox 1 % shampoo .  ClonazePAM (KLONOPIN PO), Take 1 tablet by mouth 2 (two) times daily as needed. Marland Kitchen  KLONOPIN 0.5 MG tablet, Take 1 tablet by mouth twice a day as needed TKE 1/2-1 AS NEEDED .  Melatonin 3 MG TABS, Take by mouth at bedtime as needed.  Marland Kitchen  olopatadine (PATANOL) 0.1 % ophthalmic solution, INSTILL 1 DROP INTO BOTH  EYES TWICE DAILY. Marland Kitchen  omeprazole (PRILOSEC) 20 MG capsule, TAKE 1 CAPSULE BY MOUTH  EVERY OTHER DAY .  vortioxetine HBr (TRINTELLIX) 20 MG TABS tablet, Trintellix 20 mg tablet  TK 1 T PO D    Past medical history, social, surgical and family history all reviewed in electronic medical record.  [name] pertanent information unless stated regarding to the chief complaint.   Review of Systems:  [name], visual changes, nausea, vomiting, diarrhea, constipation, dizziness, abdominal pain, skin rash, fevers, chills, night sweats, weight loss, swollen lymph nodes, body aches, joint swelling, chest pain, shortness of breath, mood changes. POSITIVE muscle aches  Objective  Blood pressure 118/88, temperature (!) 101 F (38.3 C), height 5\' 2"  (1.575 m), weight 197 lb (89.4 kg), SpO2 97 %.   General: [name] apparent distress alert and oriented x3 mood and affect normal, dressed appropriately.  HEENT: Pupils equal,  extraocular movements intact  Respiratory: Patient's speak in full sentences and does not appear short of breath  Cardiovascular: [name], non tender, [name]  Skin: Warm dry intact with [name] signs of infection or rash on extremities or on axial skeleton.  Abdomen: Soft nontender  Neuro: Cranial nerves II through XII are intact, neurovascularly intact in all extremities with 2+ DTRs and 2+ pulses.  Lymph: [name] lymphadenopathy of posterior or anterior cervical chain or axillae bilaterally.  Gait normal with good balance and coordination.  MSK:  Non tender with full range of motion and good stability and symmetric strength and tone of shoulders, elbows, wrist, hip, knee and ankles bilaterally.  Foot pain bilaterally shows the patient does have some mild loss of  longitudinal arch.  Patient also has loss of transverse arch noted.of the transverse arch noted with some splaying between the first and second toes right greater than left.    Impression and Recommendations:     The above documentation has been reviewed and is accurate and complete Lyndal Pulley, DO       Note: This dictation was prepared with Dragon dictation along with smaller phrase technology. Any transcriptional errors that result from this process are unintentional.

## 2019-08-28 NOTE — Assessment & Plan Note (Signed)
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  Patient has done well with her EVA foam orthotics previously.  They are now 54 years old.  We will refer patient appropriately to have them reviewed at this time.  Patient encouraged to do the exercises on a regular basis.  Patient is walking 3 miles daily and is now doing an adjustable standing desk that I think will be beneficial.  As long as patient is well can follow-up as needed

## 2019-08-28 NOTE — Patient Instructions (Signed)
Orthotics with Dr. Raeford Razor [address] [place], [place] [address] [phone]

## 2019-08-31 ENCOUNTER — Encounter: Payer: Self-pay | Admitting: Family Medicine

## 2019-08-31 ENCOUNTER — Ambulatory Visit: Payer: No Typology Code available for payment source | Admitting: Family Medicine

## 2019-08-31 ENCOUNTER — Other Ambulatory Visit: Payer: Self-pay

## 2019-08-31 DIAGNOSIS — M216X1 Other acquired deformities of right foot: Secondary | ICD-10-CM | POA: Diagnosis not present

## 2019-08-31 NOTE — Progress Notes (Signed)
Jessica [name] - 54 y.o. female MRN [id number]  Date of birth: 1965-09-28  SUBJECTIVE:  Including CC & ROS.  Chief Complaint  Patient presents with  . Foot Orthotics    Jessica [name] is a 54 y.o. female that is presenting with a history of right foot pain.  She has lost her transverse arch previously and had custom orthotics made.  That was sometime ago.  No significant pain today.    Review of Systems See HPI   HISTORY: Past Medical, Surgical, Social, and Family History Reviewed & Updated per EMR.   Pertinent Historical Findings include:  Past Medical History:  Diagnosis Date  . ALLERGIC RHINITIS CAUSE UNSPECIFIED   . ANXIETY DISORDER   . Complication of anesthesia   . CYSTIC ACNE   . DEPRESSION   . DYSPLASTIC NEVUS, CHEST   . ELEVATED BP READING WITHOUT DX HYPERTENSION   . GERD (gastroesophageal reflux disease)   . INSOMNIA   . Mixed hyperlipidemia   . PONV (postoperative nausea and vomiting)   . THROMBOCYTOSIS     Past Surgical History:  Procedure Laterality Date  . BREAST SURGERY  2000   Breast reduction  . CHOLECYSTECTOMY  05/04/2018  . CHOLECYSTECTOMY N/A 05/04/2018   Procedure: LAPAROSCOPIC CHOLECYSTECTOMY;  Surgeon: Georganna Skeans, MD;  Location: [place];  Service: General;  Laterality: N/A;  . Deviated septum repair  1997    No Known Allergies  Family History  Problem Relation Age of Onset  . Hyperlipidemia Father   . Hypertension Father   . Depression Sister   . Coronary artery disease Paternal Grandfather      Social History   Socioeconomic History  . Marital status: Married    Spouse name: paul  . Number of children: Not on file  . Years of education: Not on file  . Highest education level: Not on file  Occupational History  . Not on file  Tobacco Use  . Smoking status: Never Smoker  . Smokeless tobacco: Never Used  Substance and Sexual Activity  . Alcohol use: No  . Drug use: No  . Sexual activity: Not on file  Other Topics  Concern  . Not on file  Social History Narrative   Married lives with spouse and two kids. She is originally from [place] to [place]   Social Determinants of Health   Financial Resource Strain:   . Difficulty of Paying Living Expenses: Not on file  Food Insecurity:   . Worried About Charity fundraiser in the Last Year: Not on file  . Ran Out of Food in the Last Year: Not on file  Transportation Needs:   . Lack of Transportation (Medical): Not on file  . Lack of Transportation (Non-Medical): Not on file  Physical Activity:   . Days of Exercise per Week: Not on file  . Minutes of Exercise per Session: Not on file  Stress:   . Feeling of Stress : Not on file  Social Connections:   . Frequency of Communication with Friends and Family: Not on file  . Frequency of Social Gatherings with Friends and Family: Not on file  . Attends Religious Services: Not on file  . Active Member of Clubs or Organizations: Not on file  . Attends Archivist Meetings: Not on file  . Marital Status: Not on file  Intimate Partner Violence:   . Fear of Current or Ex-Partner: Not on file  . Emotionally Abused: Not on file  .  Physically Abused: Not on file  . Sexually Abused: Not on file     PHYSICAL EXAM:  VS: BP 124/84   Pulse 98   Ht 5\' 3"  (1.6 m)   Wt 190 lb (86.2 kg)   BMI 33.66 kg/m  Physical Exam Gen: NAD, alert, cooperative with exam, well-appearing ENT: normal lips, normal nasal mucosa,  Eye: normal EOM, normal conjunctiva and lids Skin: no rashes, no areas of induration  Neuro: normal tone, normal sensation to touch Psych:  normal insight, alert and oriented MSK:  Left and right foot: Some loss of the transverse arch. Some loss of longitudinal arch. No hammertoes. No significant hallux valgus. Neurovascularly intact  Patient was fitted for a standard, cushioned, semi-rigid orthotic. The orthotic was heated and afterward the patient stood on the orthotic blank  positioned on the orthotic stand. The patient was positioned in subtalar neutral position and 10 degrees of ankle dorsiflexion in a weight bearing stance. After completion of molding, a stable base was applied to the orthotic blank. The blank was ground to a stable position for weight bearing. Size: men's 7 Base: Blue EVA Additional Posting and Padding: Bilateral metatarsal pads and first ray post The patient ambulated these, and they were very comfortable.    ASSESSMENT & PLAN:   Loss of transverse plantar arch Has done well previously with her custom orthotics. -Orthotics today.

## 2019-08-31 NOTE — Assessment & Plan Note (Signed)
Has done well previously with her custom orthotics. -Orthotics today.

## 2020-01-02 ENCOUNTER — Other Ambulatory Visit: Payer: Self-pay | Admitting: Internal Medicine

## 2020-01-02 ENCOUNTER — Encounter: Payer: Self-pay | Admitting: Internal Medicine

## 2020-01-02 MED ORDER — PSEUDOEPHEDRINE HCL ER 120 MG PO TB12: 120.0000 mg | ORAL_TABLET | Freq: Two times a day (BID) | ORAL | 0 refills | Status: AC | PRN

## 2020-04-06 ENCOUNTER — Other Ambulatory Visit: Payer: Self-pay | Admitting: Internal Medicine

## 2020-05-01 ENCOUNTER — Ambulatory Visit (INDEPENDENT_AMBULATORY_CARE_PROVIDER_SITE_OTHER): Payer: No Typology Code available for payment source | Admitting: Family Medicine

## 2020-05-01 ENCOUNTER — Encounter: Payer: Self-pay | Admitting: Family Medicine

## 2020-05-01 DIAGNOSIS — R519 Headache, unspecified: Secondary | ICD-10-CM

## 2020-05-01 DIAGNOSIS — R21 Rash and other nonspecific skin eruption: Secondary | ICD-10-CM

## 2020-05-01 DIAGNOSIS — H9202 Otalgia, left ear: Secondary | ICD-10-CM | POA: Diagnosis not present

## 2020-05-01 MED ORDER — VALACYCLOVIR HCL 1 G PO TABS: 1000.0000 mg | ORAL_TABLET | Freq: Three times a day (TID) | ORAL | 0 refills | Status: DC

## 2020-05-01 NOTE — Progress Notes (Signed)
Virtual Visit via Video Note  I connected with Jessica [name]  on 05/01/20 at 10:00 AM EDT by a video enabled telemedicine application and verified that I am speaking with the correct person using two identifiers.  Location patient: home, [place] Location provider:work or home office Persons participating in the virtual visit: patient, provider  I discussed the limitations of evaluation and management by telemedicine and the availability of in person appointments. The patient expressed understanding and agreed to proceed.   HPI:  Acute telemedicine visit for oral lesions: -Onset: 2 days ago -Symptoms include: ulcers in mouth and pain and rash only on L lower face, lip, ? L ear -Denies: fevers, malaise, any resp symptoms out of the ordinary, hearing loss, lesions in or near the eye, ringing in the ears -has hx of chickepox as a kid -did have a headache, mild sinus issues and sore throat last week - but reports can have this with her allergies -Pertinent past medical history: tends to get skin rashes (numular eczema) and sinus issues frequently -Pertinent medication allergies: -COVID-19 vaccine status: fully vaccinated with pfizer -fully vaccinated o/w but has not had shingles vaccine  ROS: See pertinent positives and negatives per HPI.  Past Medical History:  Diagnosis Date  . ALLERGIC RHINITIS CAUSE UNSPECIFIED   . ANXIETY DISORDER   . Complication of anesthesia   . CYSTIC ACNE   . DEPRESSION   . DYSPLASTIC NEVUS, CHEST   . ELEVATED BP READING WITHOUT DX HYPERTENSION   . GERD (gastroesophageal reflux disease)   . INSOMNIA   . Mixed hyperlipidemia   . PONV (postoperative nausea and vomiting)   . THROMBOCYTOSIS     Past Surgical History:  Procedure Laterality Date  . BREAST SURGERY  2000   Breast reduction  . CHOLECYSTECTOMY  05/04/2018  . CHOLECYSTECTOMY N/A 05/04/2018   Procedure: LAPAROSCOPIC CHOLECYSTECTOMY;  Surgeon: Georganna Skeans, MD;  Location: [place];  Service: General;   Laterality: N/A;  . Deviated septum repair  1997     Current Outpatient Medications:  .  augmented betamethasone dipropionate (DIPROLENE-AF) 0.05 % cream, Apply topically 2 (two) times daily as needed., Disp: 150 g, Rfl: 1 .  betamethasone dipropionate 0.05 % cream, , Disp: , Rfl:  .  buPROPion (WELLBUTRIN XL) 300 MG 24 hr tablet, Take 300 mg daily by mouth., Disp: , Rfl: 3 .  cetirizine (ZYRTEC) 10 MG tablet, Take 10 mg by mouth as needed.  , Disp: , Rfl:  .  Ciclopirox 1 % shampoo, ciclopirox 1 % shampoo, Disp: , Rfl:  .  ClonazePAM (KLONOPIN PO), Take 1 tablet by mouth 2 (two) times daily as needed., Disp: , Rfl:  .  drospirenone-ethinyl estradiol (YAZ) 3-0.02 MG per tablet, Take 1 tablet by mouth daily.  , Disp: , Rfl:  .  fexofenadine (ALLEGRA) 180 MG tablet, Take 180 mg by mouth as needed., Disp: , Rfl:  .  KLONOPIN 0.5 MG tablet, Take 1 tablet by mouth twice a day as needed TKE 1/2-1 AS NEEDED, Disp: , Rfl:  .  Melatonin 3 MG TABS, Take by mouth at bedtime as needed. , Disp: , Rfl:  .  meloxicam (MOBIC) 15 MG tablet, Take 1 tablet (15 mg total) by mouth daily. (Patient taking differently: Take 15 mg daily as needed by mouth. ), Disp: 90 tablet, Rfl: 1 .  montelukast (SINGULAIR) 10 MG tablet, TAKE 1 TABLET BY MOUTH AT  BEDTIME, Disp: 90 tablet, Rfl: 3 .  olopatadine (PATANOL) 0.1 % ophthalmic solution, INSTILL 1  DROP INTO BOTH  EYES TWICE DAILY., Disp: 15 mL, Rfl: 5 .  omeprazole (PRILOSEC) 20 MG capsule, TAKE 1 CAPSULE BY MOUTH  EVERY OTHER DAY, Disp: 45 capsule, Rfl: 3 .  Phenylephrine HCl 5 MG TABS, Take by mouth as needed.  , Disp: , Rfl:  .  pseudoephedrine (SUDAFED) 120 MG 12 hr tablet, Take 1 tablet (120 mg total) by mouth 2 (two) times daily as needed for congestion., Disp: 90 tablet, Rfl: 0 .  simvastatin (ZOCOR) 20 MG tablet, TAKE 1 TABLET BY MOUTH AT  BEDTIME, Disp: 90 tablet, Rfl: 3 .  vortioxetine HBr (TRINTELLIX) 20 MG TABS tablet, Trintellix 20 mg tablet  TK 1 T PO D,  Disp: , Rfl:  .  valACYclovir (VALTREX) 1000 MG tablet, Take 1 tablet (1,000 mg total) by mouth 3 (three) times daily., Disp: 21 tablet, Rfl: 0  EXAM:  VITALS per patient if applicable:  GENERAL: alert, oriented, appears well and in no acute distress  HEENT: atraumatic, conjunttiva clear, no obvious abnormalities on inspection of external nose and ears - two ulcers in the Mouth under tongue on L side  NECK: normal movements of the head and neck - patient thinks swollen gland L ant neck  LUNGS: on inspection no signs of respiratory distress, breathing rate appears normal, no obvious gross SOB, gasping or wheezing  CV: no obvious cyanosis  SKIN: erythematous papular rash on the L lower face from L TMJ area to the L lip with lesion near and on the lip that appears vesicular though video quality is poor  MS: moves all visible extremities without noticeable abnormality  PSYCH/NEURO: pleasant and cooperative, no obvious depression or anxiety, speech and thought processing grossly intact  ASSESSMENT AND PLAN:  Discussed the following assessment and plan:  Rash  Facial pain  Left ear pain  -we discussed possible serious and likely etiologies, options for evaluation and workup, limitations of telemedicine visit vs in person visit, treatment, treatment risks and precautions. Pt prefers to treat via telemedicine empirically rather than in person at this moment. Query possible shingles given L sided distribution. Discussed possibility of Virl Axe and also other possible etiologies both viral, bacterial vs other. Opted given classic distribution to get started on Valtrex 1g tid x 7 days. Also, discussed potential benefits/risks of prednisone - she opted to hold of on this for now. Advised close follow up with PCP or elsewhere in 1-2 days to recheck and look in the ear w/ consideration of adding prednisone if vesicular lesions in the ear canal or strong suspicion for RH vs changing course if  inperson exam suggests different dx. She agrees to call PCP office right after this appt to schedule follow up. Discussed other viral illnesses and COVID19 - thought to be less likely, though testing options provided.   Advised to seek promptin person care in the interim if worsening, new symptoms arise, or if is not improving with treatment. Did let this patient know that I only do telemedicine on Tuesdays and Thursdays for [place]. Advised to schedule follow up visit with PCP or [facility] if any further questions or concerns to avoid delays in care.   I discussed the assessment and treatment plan with the patient. The patient was provided an opportunity to ask questions and all were answered. The patient agreed with the plan and demonstrated an understanding of the instructions.     Lucretia Kern, DO

## 2020-05-01 NOTE — Patient Instructions (Signed)
-  I sent the medication(s) we discussed to your pharmacy: Meds ordered this encounter  Medications  . valACYclovir (VALTREX) 1000 MG tablet    Sig: Take 1 tablet (1,000 mg total) by mouth 3 (three) times daily.    Dispense:  21 tablet    Refill:  0    This could be shingles and the above medication can be helpful for that diagnoses. I do recommend that you schedule a close follow up inperson exam with your primary care doctor or an urgent care/minute clinic in 1-2 days to look in the ear and to get a better look to confirm the diagnoses or change course if this looks like something else.   I hope you are feeling better soon! Seek care promptly in the interim if your symptoms worsen, new concerns arise or you are not improving with treatment.

## 2020-05-04 NOTE — Progress Notes (Signed)
Subjective:    Patient ID: Jessica [name], female    DOB: 07-14-1966, 54 y.o.   MRN: [id number]  HPI The patient is here for an acute visit - ? shingles.  She had a virtual visit 9/23 for oral lesions x 2 days.  Ulcers in mouth and pain and rash on left lower face, lip and left ear.  She had a headache, mild sinus issues and sore throat the week prior.    She was started on valtrex for likely shingles, viral cause.  She deferred prednisone, but may need to be added if vesicular lesions ear canal.    She is here for follow up.  She has been applying cold packs and taking advil.  She has a good amount of pain and it has been affecting her sleep. She has taken benadryl at night. She tried betamethasone on her lesions.      Medications and allergies reviewed with patient and updated if appropriate.  Patient Active Problem List   Diagnosis Date Noted  . Loss of transverse plantar arch 08/28/2019  . Eczema 06/05/2018  . Hyperglycemia 06/05/2018  . Obesity with body mass index greater than 30 06/02/2017  . GERD (gastroesophageal reflux disease) 09/02/2015  . IBS (irritable bowel syndrome) 05/01/2014  . Nonallopathic lesion of thoracic region 12/24/2013  . Greater trochanteric bursitis of left hip 04/30/2013  . Chronic low back pain 04/23/2013  . Sacroiliac joint dysfunction of left side 04/19/2013  . Greater trochanteric bursitis of right hip 04/11/2013  . Hip flexor tightness 04/11/2013  . Nonallopathic lesion of lumbosacral region 04/11/2013  . Nonallopathic lesion of pelvic region 04/11/2013  . Gait abnormality 05/09/2012  . Depression 04/24/2010  . Allergic rhinitis 12/06/2008  . 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 Mixed hyperlipidemia 04/22/2008  . INSOMNIA 04/22/2008  . Anxiety state 10/04/2007    Current Outpatient Medications on File Prior to Visit  Medication Sig Dispense Refill  . augmented betamethasone dipropionate (DIPROLENE-AF) 0.05 % cream Apply topically  2 (two) times daily as needed. 150 g 1  . betamethasone dipropionate 0.05 % cream     . buPROPion (WELLBUTRIN XL) 300 MG 24 hr tablet Take 300 mg daily by mouth.  3  . cetirizine (ZYRTEC) 10 MG tablet Take 10 mg by mouth as needed.      . Ciclopirox 1 % shampoo ciclopirox 1 % shampoo    . drospirenone-ethinyl estradiol (YAZ) 3-0.02 MG per tablet Take 1 tablet by mouth daily.      . fexofenadine (ALLEGRA) 180 MG tablet Take 180 mg by mouth as needed.    Marland Kitchen KLONOPIN 0.5 MG tablet Take 1 tablet by mouth twice a day as needed TKE 1/2-1 AS NEEDED    . Melatonin 3 MG TABS Take by mouth at bedtime as needed.     . meloxicam (MOBIC) 15 MG tablet Take 1 tablet (15 mg total) by mouth daily. (Patient taking differently: Take 15 mg daily as needed by mouth. ) 90 tablet 1  . montelukast (SINGULAIR) 10 MG tablet TAKE 1 TABLET BY MOUTH AT  BEDTIME 90 tablet 3  . olopatadine (PATANOL) 0.1 % ophthalmic solution INSTILL 1 DROP INTO BOTH  EYES TWICE DAILY. 15 mL 5  . omeprazole (PRILOSEC) 20 MG capsule TAKE 1 CAPSULE BY MOUTH  EVERY OTHER DAY 45 capsule 3  . Phenylephrine HCl 5 MG TABS Take by mouth as needed.      . pseudoephedrine (SUDAFED) 120 MG 12 hr tablet Take 1  tablet (120 mg total) by mouth 2 (two) times daily as needed for congestion. 90 tablet 0  . simvastatin (ZOCOR) 20 MG tablet TAKE 1 TABLET BY MOUTH AT  BEDTIME 90 tablet 3  . valACYclovir (VALTREX) 1000 MG tablet Take 1 tablet (1,000 mg total) by mouth 3 (three) times daily. 21 tablet 0  . vortioxetine HBr (TRINTELLIX) 20 MG TABS tablet Trintellix 20 mg tablet  TK 1 T PO D     No current facility-administered medications on file prior to visit.    Past Medical History:  Diagnosis Date  . ALLERGIC RHINITIS CAUSE UNSPECIFIED   . ANXIETY DISORDER   . Complication of anesthesia   . CYSTIC ACNE   . DEPRESSION   . DYSPLASTIC NEVUS, CHEST   . ELEVATED BP READING WITHOUT DX HYPERTENSION   . GERD (gastroesophageal reflux disease)   . INSOMNIA   .  Mixed hyperlipidemia   . PONV (postoperative nausea and vomiting)   . THROMBOCYTOSIS     Past Surgical History:  Procedure Laterality Date  . BREAST SURGERY  2000   Breast reduction  . CHOLECYSTECTOMY  05/04/2018  . CHOLECYSTECTOMY N/A 05/04/2018   Procedure: LAPAROSCOPIC CHOLECYSTECTOMY;  Surgeon: Georganna Skeans, MD;  Location: [place];  Service: General;  Laterality: N/A;  . Deviated septum repair  1997    Social History   Socioeconomic History  . Marital status: Married    Spouse name: paul  . Number of children: Not on file  . Years of education: Not on file  . Highest education level: Not on file  Occupational History  . Not on file  Tobacco Use  . Smoking status: Never Smoker  . Smokeless tobacco: Never Used  Vaping Use  . Vaping Use: Never used  Substance and Sexual Activity  . Alcohol use: No  . Drug use: No  . Sexual activity: Not on file  Other Topics Concern  . Not on file  Social History Narrative   Married lives with spouse and two kids. She is originally from [place] to [place]   Social Determinants of Health   Financial Resource Strain:   . Difficulty of Paying Living Expenses: Not on file  Food Insecurity:   . Worried About Charity fundraiser in the Last Year: Not on file  . Ran Out of Food in the Last Year: Not on file  Transportation Needs:   . Lack of Transportation (Medical): Not on file  . Lack of Transportation (Non-Medical): Not on file  Physical Activity:   . Days of Exercise per Week: Not on file  . Minutes of Exercise per Session: Not on file  Stress:   . Feeling of Stress : Not on file  Social Connections:   . Frequency of Communication with Friends and Family: Not on file  . Frequency of Social Gatherings with Friends and Family: Not on file  . Attends Religious Services: Not on file  . Active Member of Clubs or Organizations: Not on file  . Attends Archivist Meetings: Not on file  . Marital Status: Not on  file    Family History  Problem Relation Age of Onset  . Hyperlipidemia Father   . Hypertension Father   . Depression Sister   . Coronary artery disease Paternal Grandfather     Review of Systems  Constitutional: Positive for fatigue.  HENT: Positive for ear pain (left ear feels clogged) and tinnitus. Negative for hearing loss.   Neurological: Positive for numbness.  Negative for dizziness and headaches.       Objective:   Vitals:   05/05/20 1503  BP: 128/84  Pulse: (!) 105  Temp: 98.6 F (37 C)  SpO2: 97%   BP Readings from Last 3 Encounters:  05/05/20 128/84  08/31/19 124/84  08/28/19 118/88   Wt Readings from Last 3 Encounters:  05/05/20 193 lb 9.6 oz (87.8 kg)  08/31/19 190 lb (86.2 kg)  08/28/19 197 lb (89.4 kg)   Body mass index is 34.29 kg/m.   Physical Exam Constitutional:      General: She is not in acute distress.    Appearance: Normal appearance. She is not ill-appearing.  HENT:     Head: Normocephalic and atraumatic.     Right Ear: Tympanic membrane, ear canal and external ear normal.     Ears:     Comments: Left ear canal with single blister and TM with effusion and one erythematous lesion on TM - no general TM erythema    Mouth/Throat:     Mouth: Mucous membranes are moist.     Pharynx: No oropharyngeal exudate or posterior oropharyngeal erythema.  Lymphadenopathy:     Cervical: Cervical adenopathy (nontender) present.  Skin:    General: Skin is warm and dry.     Findings: Rash (vesicular rash left lower face to mid chin. lesion on lip and lesin in ear canal, one lesion on left TM) present.  Neurological:     Mental Status: She is alert.            Assessment & Plan:    See Problem List for Assessment and Plan of chronic medical problems.    This visit occurred during the SARS-CoV-2 public health emergency.  Safety protocols were in place, including screening questions prior to the visit, additional usage of staff PPE, and  extensive cleaning of exam room while observing appropriate contact time as indicated for disinfecting solutions.

## 2020-05-05 ENCOUNTER — Ambulatory Visit (INDEPENDENT_AMBULATORY_CARE_PROVIDER_SITE_OTHER): Payer: No Typology Code available for payment source | Admitting: Internal Medicine

## 2020-05-05 ENCOUNTER — Encounter: Payer: Self-pay | Admitting: Internal Medicine

## 2020-05-05 ENCOUNTER — Other Ambulatory Visit: Payer: Self-pay

## 2020-05-05 DIAGNOSIS — B029 Zoster without complications: Secondary | ICD-10-CM

## 2020-05-05 MED ORDER — GABAPENTIN 100 MG PO CAPS: 100.0000 mg | ORAL_CAPSULE | Freq: Every day | ORAL | 2 refills | Status: DC

## 2020-05-05 MED ORDER — PREDNISONE 20 MG PO TABS: 40.0000 mg | ORAL_TABLET | Freq: Every day | ORAL | 0 refills | Status: DC

## 2020-05-05 NOTE — Assessment & Plan Note (Signed)
Acute V3 on left distribution Started around 9/22, virtual visit 9/23 - started on valtrex Taking advil, benadryl and apply cold compresses Has vesicular lesion on TM, clogged feeling in ear - no dec hearing/vertigo/tinnitis - prednisone 40 mg qd x 5 days Gabapentin 100-300 mg at HS - can titrate as needed

## 2020-05-05 NOTE — Patient Instructions (Addendum)
Try gabapentin at night for the nerve pain.  Take at night.  Increase the dose as tolerated.   Take the prednisone in the morning with breakfast.  If you have side effects you can stop this.      Shingles  Shingles, which is also known as herpes zoster, is an infection that causes a painful skin rash and fluid-filled blisters. It is caused by a virus. Shingles only develops in people who:  Have had chickenpox.  Have been given a medicine to protect against chickenpox (have been vaccinated). Shingles is rare in this group. What are the causes? Shingles is caused by varicella-zoster virus (VZV). This is the same virus that causes chickenpox. After a person is exposed to VZV, the virus stays in the body in an inactive (dormant) state. Shingles develops if the virus is reactivated. This can happen many years after the first (initial) exposure to VZV. It is not known what causes this virus to be reactivated. What increases the risk? People who have had chickenpox or received the chickenpox vaccine are at risk for shingles. Shingles infection is more common in people who:  Are older than age 59.  Have a weakened disease-fighting system (immune system), such as people with: ? HIV. ? AIDS. ? Cancer.  Are taking medicines that weaken the immune system, such as transplant medicines.  Are experiencing a lot of stress. What are the signs or symptoms? Early symptoms of this condition include itching, tingling, and pain in an area on your skin. Pain may be described as burning, stabbing, or throbbing. A few days or weeks after early symptoms start, a painful red rash appears. The rash is usually on one side of the body and has a band-like or belt-like pattern. The rash eventually turns into fluid-filled blisters that break open, change into scabs, and dry up in about 2-3 weeks. At any time during the infection, you may also develop:  A fever.  Chills.  A headache.  An upset stomach. How  is this diagnosed? This condition is diagnosed with a skin exam. Skin or fluid samples may be taken from the blisters before a diagnosis is made. These samples are examined under a microscope or sent to a lab for testing. How is this treated? The rash may last for several weeks. There is not a specific cure for this condition. Your health care provider will probably prescribe medicines to help you manage pain, recover more quickly, and avoid long-term problems. Medicines may include:  Antiviral drugs.  Anti-inflammatory drugs.  Pain medicines.  Anti-itching medicines (antihistamines). If the area involved is on your face, you may be referred to a specialist, such as an eye doctor (ophthalmologist) or an ear, nose, and throat (ENT) doctor (otolaryngologist) to help you avoid eye problems, chronic pain, or disability. Follow these instructions at home: Medicines  Take over-the-counter and prescription medicines only as told by your health care provider.  Apply an anti-itch cream or numbing cream to the affected area as told by your health care provider. Relieving itching and discomfort   Apply cold, wet cloths (cold compresses) to the area of the rash or blisters as told by your health care provider.  Cool baths can be soothing. Try adding baking soda or dry oatmeal to the water to reduce itching. Do not bathe in hot water. Blister and rash care  Keep your rash covered with a loose bandage (dressing). Wear loose-fitting clothing to help ease the pain of material rubbing against the rash.  Keep your rash and blisters clean by washing the area with mild soap and cool water as told by your health care provider.  Check your rash every day for signs of infection. Check for: ? More redness, swelling, or pain. ? Fluid or blood. ? Warmth. ? Pus or a bad smell.  Do not scratch your rash or pick at your blisters. To help avoid scratching: ? Keep your fingernails clean and cut short. ? Wear  gloves or mittens while you sleep, if scratching is a problem. General instructions  Rest as told by your health care provider.  Keep all follow-up visits as told by your health care provider. This is important.  Wash your hands often with soap and water. If soap and water are not available, use hand sanitizer. Doing this lowers your chance of getting a bacterial skin infection.  Before your blisters change into scabs, your shingles infection can cause chickenpox in people who have never had it or have never been vaccinated against it. To prevent this from happening, avoid contact with other people, especially: ? Babies. ? Pregnant women. ? Children who have eczema. ? Elderly people who have transplants. ? People who have chronic illnesses, such as cancer or AIDS. Contact a health care provider if:  Your pain is not relieved with prescribed medicines.  Your pain does not get better after the rash heals.  You have signs of infection in the rash area, such as: ? More redness, swelling, or pain around the rash. ? Fluid or blood coming from the rash. ? The rash area feeling warm to the touch. ? Pus or a bad smell coming from the rash. Get help right away if:  The rash is on your face or nose.  You have facial pain, pain around your eye area, or loss of feeling on one side of your face.  You have difficulty seeing.  You have ear pain or have ringing in your ear.  You have a loss of taste.  Your condition gets worse. Summary  Shingles, which is also known as herpes zoster, is an infection that causes a painful skin rash and fluid-filled blisters.  This condition is diagnosed with a skin exam. Skin or fluid samples may be taken from the blisters and examined before the diagnosis is made.  Keep your rash covered with a loose bandage (dressing). Wear loose-fitting clothing to help ease the pain of material rubbing against the rash.  Before your blisters change into scabs, your  shingles infection can cause chickenpox in people who have never had it or have never been vaccinated against it. This information is not intended to replace advice given to you by your health care provider. Make sure you discuss any questions you have with your health care provider. Document Revised: 11/17/2018 Document Reviewed: 03/30/2017 Elsevier Patient Education  2020 [employer].

## 2020-05-26 ENCOUNTER — Encounter: Payer: Self-pay | Admitting: Internal Medicine

## 2020-06-04 ENCOUNTER — Encounter: Payer: Self-pay | Admitting: Internal Medicine

## 2020-06-17 ENCOUNTER — Encounter: Payer: Self-pay | Admitting: Internal Medicine

## 2020-06-18 MED ORDER — MUPIROCIN CALCIUM 2 % EX CREA: 1.0000 "application " | TOPICAL_CREAM | Freq: Two times a day (BID) | CUTANEOUS | 0 refills | Status: DC

## 2020-07-02 ENCOUNTER — Other Ambulatory Visit: Payer: Self-pay | Admitting: Internal Medicine

## 2020-08-11 ENCOUNTER — Encounter: Payer: Self-pay | Admitting: Internal Medicine

## 2020-11-10 ENCOUNTER — Ambulatory Visit: Payer: No Typology Code available for payment source | Admitting: Family Medicine

## 2020-11-27 NOTE — Progress Notes (Signed)
[facility] [address] [place] Phone: [phone] Subjective:   I Jessica [name] am serving as a Education administrator for Dr. Hulan Saas.  This visit occurred during the SARS-CoV-2 public health emergency.  Safety protocols were in place, including screening questions prior to the visit, additional usage of staff PPE, and extensive cleaning of exam room while observing appropriate contact time as indicated for disinfecting solutions.   I'm seeing this patient by the request  of:  Binnie Rail, MD  CC: Foot pain low back pain  [id number]  Jessica [name] is a 55 y.o. female coming in with complaint of bilateral foot pain. Last seen in January 2021 for loss of transverse arch. Patient states she has always had low back bilateral foot pain. With really long hikes/ walks (5+ miles). Has blisters and calluses on the left big toe. Wants to know what will help. Brought her hiking boots today for fitting.       Past Medical History:  Diagnosis Date  . ALLERGIC RHINITIS CAUSE UNSPECIFIED   . ANXIETY DISORDER   . Complication of anesthesia   . CYSTIC ACNE   . DEPRESSION   . DYSPLASTIC NEVUS, CHEST   . ELEVATED BP READING WITHOUT DX HYPERTENSION   . GERD (gastroesophageal reflux disease)   . INSOMNIA   . Mixed hyperlipidemia   . PONV (postoperative nausea and vomiting)   . THROMBOCYTOSIS    Past Surgical History:  Procedure Laterality Date  . BREAST SURGERY  2000   Breast reduction  . CHOLECYSTECTOMY  05/04/2018  . CHOLECYSTECTOMY N/A 05/04/2018   Procedure: LAPAROSCOPIC CHOLECYSTECTOMY;  Surgeon: Georganna Skeans, MD;  Location: [facility];  Service: General;  Laterality: N/A;  . Deviated septum repair  1997   Social History   Socioeconomic History  . Marital status: Married    Spouse name: paul  . Number of children: Not on file  . Years of education: Not on file  . Highest education level: Not on file  Occupational History  . Not  on file  Tobacco Use  . Smoking status: Never Smoker  . Smokeless tobacco: Never Used  Vaping Use  . Vaping Use: Never used  Substance and Sexual Activity  . Alcohol use: No  . Drug use: No  . Sexual activity: Not on file  Other Topics Concern  . Not on file  Social History Narrative   Married lives with spouse and two kids. She is originally from [place] to [place]   Social Determinants of Health   Financial Resource Strain: Not on file  Food Insecurity: Not on file  Transportation Needs: Not on file  Physical Activity: Not on file  Stress: Not on file  Social Connections: Not on file   No Known Allergies Family History  Problem Relation Age of Onset  . Hyperlipidemia Father   . Hypertension Father   . Depression Sister   . Coronary artery disease Paternal Grandfather     Current Outpatient Medications (Endocrine & Metabolic):  .  drospirenone-ethinyl estradiol (YAZ) 3-0.02 MG per tablet, Take 1 tablet by mouth daily.  Current Outpatient Medications (Cardiovascular):  .  simvastatin (ZOCOR) 20 MG tablet, TAKE 1 TABLET BY MOUTH AT  BEDTIME  Current Outpatient Medications (Respiratory):  .  cetirizine (ZYRTEC) 10 MG tablet, Take 10 mg by mouth as needed. .  fexofenadine (ALLEGRA) 180 MG tablet, Take 180 mg by mouth as needed. .  montelukast (SINGULAIR) 10 MG tablet, TAKE  1 TABLET BY MOUTH AT  BEDTIME .  Phenylephrine HCl 5 MG TABS, Take by mouth as needed. .  pseudoephedrine (SUDAFED) 120 MG 12 hr tablet, Take 1 tablet (120 mg total) by mouth 2 (two) times daily as needed for congestion.  Current Outpatient Medications (Analgesics):  .  meloxicam (MOBIC) 15 MG tablet, Take 1 tablet (15 mg total) by mouth daily. (Patient taking differently: Take 15 mg by mouth daily as needed.)   Current Outpatient Medications (Other):  .  augmented betamethasone dipropionate (DIPROLENE-AF) 0.05 % cream, Apply topically 2 (two) times daily as needed. .  betamethasone  dipropionate 0.05 % cream,  .  buPROPion (WELLBUTRIN XL) 300 MG 24 hr tablet, Take 300 mg daily by mouth. Marland Kitchen  KLONOPIN 0.5 MG tablet, Take 1 tablet by mouth twice a day as needed TKE 1/2-1 AS NEEDED .  Melatonin 3 MG TABS, Take by mouth at bedtime as needed. Marland Kitchen  olopatadine (PATANOL) 0.1 % ophthalmic solution, INSTILL 1 DROP INTO BOTH  EYES TWICE DAILY. Marland Kitchen  omeprazole (PRILOSEC) 20 MG capsule, TAKE 1 CAPSULE BY MOUTH  EVERY OTHER DAY .  vortioxetine HBr (TRINTELLIX) 20 MG TABS tablet, Trintellix 20 mg tablet  TK 1 T PO D   Reviewed prior external information including notes and imaging from  primary care provider As well as notes that were available from care everywhere and other healthcare systems.  Past medical history, social, surgical and family history all reviewed in electronic medical record.  No pertanent information unless stated regarding to the chief complaint.   Review of Systems:  No headache, visual changes, nausea, vomiting, diarrhea, constipation, dizziness, abdominal pain, skin rash, fevers, chills, night sweats, weight loss, swollen lymph nodes, body aches, joint swelling, chest pain, shortness of breath, mood changes. POSITIVE muscle aches  Objective  Blood pressure 130/82, pulse (!) 104, height 5\' 3"  (1.6 m), weight 198 lb (89.8 kg), SpO2 97 %.   General: No apparent distress alert and oriented x3 mood and affect normal, dressed appropriately.  HEENT: Pupils equal, extraocular movements intact  Respiratory: Patient's speak in full sentences and does not appear short of breath  Cardiovascular: No lower extremity edema, non tender, no erythema  Gait normal with good balance and coordination.  MSK:   Foot exam shows the patient does have breakdown of the transverse arch bilaterally.  Patient does have callus formation noted over the first toe on both feet.  No true blister formation noted.  Very mild hallux limitus bilaterally.  No significant bunion and bunionette formation  none noted.  Patient has very mild tightness of the posterior cord of the ankle but full range of motion noted.  Low back exam still shows that patient does have tenderness to palpation over the sacroiliac joint left greater than right.  Tender to palpation minorly over the paraspinal musculature right greater than left.  Negative straight leg test.  5 out of 5 strength of the lower extremities.  Tightness of the hip flexor still noted.  Osteopathic findings T6 extended rotated and side bent left L1 flexed rotated and side bent right Sacrum left on left   Impression and Recommendations:     The above documentation has been reviewed and is accurate and complete Lyndal Pulley, DO

## 2020-11-28 ENCOUNTER — Other Ambulatory Visit: Payer: Self-pay

## 2020-11-28 ENCOUNTER — Ambulatory Visit (INDEPENDENT_AMBULATORY_CARE_PROVIDER_SITE_OTHER): Payer: No Typology Code available for payment source | Admitting: Family Medicine

## 2020-11-28 ENCOUNTER — Encounter: Payer: Self-pay | Admitting: Family Medicine

## 2020-11-28 VITALS — BP 130/82 | HR 104 | Ht 63.0 in | Wt 198.0 lb

## 2020-11-28 DIAGNOSIS — M533 Sacrococcygeal disorders, not elsewhere classified: Secondary | ICD-10-CM

## 2020-11-28 DIAGNOSIS — M9904 Segmental and somatic dysfunction of sacral region: Secondary | ICD-10-CM | POA: Diagnosis not present

## 2020-11-28 DIAGNOSIS — M9902 Segmental and somatic dysfunction of thoracic region: Secondary | ICD-10-CM

## 2020-11-28 DIAGNOSIS — M216X1 Other acquired deformities of right foot: Secondary | ICD-10-CM | POA: Diagnosis not present

## 2020-11-28 DIAGNOSIS — M9903 Segmental and somatic dysfunction of lumbar region: Secondary | ICD-10-CM | POA: Diagnosis not present

## 2020-11-28 NOTE — Assessment & Plan Note (Signed)
Made adjustments to custom orthotics.  Metatarsal pads added today that I think will be beneficial.  Patient will be hiking in the near future and I think will do relatively well.  Follow-up again in 6 to 8 weeks

## 2020-11-28 NOTE — Assessment & Plan Note (Signed)
Patient continues to have some difficulty with the sacroiliac joint.  It has been sometime since we have seen her.  Discussed core strengthening again.  Patient should respond relatively well overall.  Discussed icing regimen and home exercises.  Follow-up again in 6 to 8 weeks

## 2020-11-28 NOTE — Patient Instructions (Addendum)
Good to see you Patellofemoral exercises Made adjustments to the shoes Try 3 sets of 30 seconds planks most days See me again in 6 weeks

## 2020-12-01 ENCOUNTER — Encounter: Payer: Self-pay | Admitting: Family Medicine

## 2020-12-24 ENCOUNTER — Encounter: Payer: Self-pay | Admitting: Internal Medicine

## 2020-12-24 NOTE — Telephone Encounter (Signed)
Patient called and said that she tested positive for Covid 19 this morning and she was wondering if she could get anti- virals. She can be reached at [phone]. Please advise

## 2020-12-25 NOTE — Progress Notes (Signed)
Virtual Visit via Video Note  I connected with Jessica [name] on 12/25/20 at  1:20 PM EDT by a video enabled telemedicine application and verified that I am speaking with the correct person using two identifiers.   I discussed the limitations of evaluation and management by telemedicine and the availability of in person appointments. The patient expressed understanding and agreed to proceed.  Present for the visit:  Myself, Dr Billey Gosling, Danae Chen Levy-Jollie.  The patient is currently at home and I am in the office.    No referring provider.    History of Present Illness: She is here for an acute visit.   She is covid positive - her symptoms started 3 days ago.  She tested positive Wednesday, three days ago.  Her husband got sick first and then she got it.    She has nasal congestion, cough, headaches, fatigue. She has some sinus pain, dec appetite.   She is not sleeping well.    She is doing the neti pot. She has taken advil and sudafed.  She had pfizer and had the first booster.  Review of Systems  Constitutional: Positive for diaphoresis and malaise/fatigue. Negative for fever.       Decreased appetite  HENT: Positive for congestion and sinus pain (improved). Negative for ear pain and sore throat.        No change in taste or smell.  Respiratory: Positive for cough (primarily dry). Negative for sputum production, shortness of breath and wheezing.   Gastrointestinal: Negative for diarrhea and nausea.  Neurological: Positive for headaches (improved a little).      Social History   Socioeconomic History  . Marital status: Married    Spouse name: paul  . Number of children: Not on file  . Years of education: Not on file  . Highest education level: Not on file  Occupational History  . Not on file  Tobacco Use  . Smoking status: Never Smoker  . Smokeless tobacco: Never Used  Vaping Use  . Vaping Use: Never used  Substance and Sexual Activity  . Alcohol use: No  .  Drug use: No  . Sexual activity: Not on file  Other Topics Concern  . Not on file  Social History Narrative   Married lives with spouse and two kids. She is originally from [place] to [place]   Social Determinants of Health   Financial Resource Strain: Not on file  Food Insecurity: Not on file  Transportation Needs: Not on file  Physical Activity: Not on file  Stress: Not on file  Social Connections: Not on file     Observations/Objective: Appears mildly ill, but in NAD Breathing normally Occasional dry cough   Assessment and Plan:  See Problem List for Assessment and Plan of chronic medical problems.   Follow Up Instructions:    I discussed the assessment and treatment plan with the patient. The patient was provided an opportunity to ask questions and all were answered. The patient agreed with the plan and demonstrated an understanding of the instructions.   The patient was advised to call back or seek an in-person evaluation if the symptoms worsen or if the condition fails to improve as anticipated.    Binnie Rail, MD

## 2020-12-26 ENCOUNTER — Telehealth (INDEPENDENT_AMBULATORY_CARE_PROVIDER_SITE_OTHER): Payer: No Typology Code available for payment source | Admitting: Internal Medicine

## 2020-12-26 ENCOUNTER — Encounter: Payer: Self-pay | Admitting: Internal Medicine

## 2020-12-26 DIAGNOSIS — U071 COVID-19: Secondary | ICD-10-CM

## 2020-12-26 MED ORDER — GUAIFENESIN-CODEINE 100-10 MG/5ML PO SYRP: 5.0000 mL | ORAL_SOLUTION | Freq: Three times a day (TID) | ORAL | 0 refills | Status: DC | PRN

## 2020-12-26 NOTE — Assessment & Plan Note (Signed)
Acute Today is day 3 of her symptoms, tested +2 days ago Symptoms mild-moderate She is considered low risk for complications and antiviral treatment is not warranted Continue Nettie pot, Advil and Sudafed as needed She is having some difficulty sleeping because of the cough-we will prescribe guaifenesin-codeine 5 mL 3 times daily as needed Advised her to call with any questions or concerns, changes in symptoms

## 2021-04-21 NOTE — Progress Notes (Signed)
[facility] [place] Phone: [phone] Subjective:   Jessica Jessica [name], am serving as a scribe for Dr. Hulan Saas. This visit occurred during the SARS-CoV-2 public health emergency.  Safety protocols were in place, including screening questions prior to the visit, additional usage of staff PPE, and extensive cleaning of exam room while observing appropriate contact time as indicated for disinfecting solutions.   I'm seeing this patient by the request  of:  Binnie Rail, MD  CC: neck and back pain   [id number]  Jessica Jessica [name] is a 55 y.o. female coming in with complaint of back and neck pain. OMT on 11/28/2020. Patient states that she has been having pain in L glute that is sharp. Patient went to Zumba one week ago and by Friday her pain had increased. Tried yoga and this made it worse. Sitting increases her pain. Experiencing numbness over lateral aspect of upper leg. Has been taking Advil for pain relief. Had rx for prednisone and she is currently using this medication.   Medications patient has been prescribed: None         Past Medical History:  Diagnosis Date   ALLERGIC RHINITIS CAUSE UNSPECIFIED    ANXIETY DISORDER    Complication of anesthesia    CYSTIC ACNE    DEPRESSION    DYSPLASTIC NEVUS, CHEST    ELEVATED BP READING WITHOUT DX HYPERTENSION    GERD (gastroesophageal reflux disease)    INSOMNIA    Mixed hyperlipidemia    PONV (postoperative nausea and vomiting)    THROMBOCYTOSIS     Jessica [name]   Review of Systems:  Jessica [name], visual changes, nausea, vomiting, diarrhea, constipation, dizziness, abdominal pain, skin rash, fevers, chills, night sweats, weight loss, swollen lymph nodes, body aches, joint swelling, chest pain, shortness of breath, mood changes. POSITIVE muscle aches  Objective  Blood pressure 132/90, pulse 100, height '5\' 3"'$  (1.6 m), SpO2 97 %.   General: Jessica [name], dressed appropriately.  HEENT: Pupils equal, extraocular movements intact  Respiratory: Patient's speak in full sentences and does not appear short of breath  Cardiovascular: Jessica [name], non tender, Jessica [name]  Neck exam shows mild tightness noted bilaterally. Back exam shows significant tightness.  Patient is significantly uncomfortable.  Use keeps moving around the room.  Patient does have tightness noted in the thoracolumbar junction and some over the lumbosacral area.  Negative straight leg test.  Osteopathic findings  C2 flexed rotated and side bent right C7 flexed rotated and side bent left T3 extended rotated and side bent right inhaled rib T9 extended rotated and side bent left L2 flexed rotated and side bent right Sacrum right on right     Assessment and Plan:  Chronic low back pain Chronic pain with exacerbation.  Concern with some of the radicular symptoms patient is having.  We will start on gabapentin 200 mg as well as given the cyclobenzaprine.  Patient has responded to this previously.  Patient has been on prednisone recently and we will also do the meloxicam with bridging her at this time.  Patient will get new x-rays to further evaluate.  Icing regimen.  Responded relatively well to osteopathic manipulation and given injection of Toradol.  Follow-up with me again in 4 to 6 weeks   Nonallopathic problems  Decision today to treat with OMT was based on Physical Exam  After  verbal consent patient was treated with HVLA, ME, FPR techniques in cervical, rib, thoracic, lumbar, and sacral  areas  Patient tolerated the procedure well with improvement in symptoms  Patient given exercises, stretches and lifestyle modifications  See medications in patient instructions if given  Patient will follow up in 4-8 weeks      The above documentation has been reviewed and is accurate and complete Jessica Pulley, DO        Note: This dictation was prepared with Dragon dictation along with smaller phrase technology. Any transcriptional errors that result from this process are unintentional.

## 2021-04-22 ENCOUNTER — Other Ambulatory Visit: Payer: Self-pay

## 2021-04-22 ENCOUNTER — Ambulatory Visit (INDEPENDENT_AMBULATORY_CARE_PROVIDER_SITE_OTHER): Payer: No Typology Code available for payment source | Admitting: Family Medicine

## 2021-04-22 ENCOUNTER — Encounter: Payer: Self-pay | Admitting: Family Medicine

## 2021-04-22 VITALS — BP 132/90 | HR 100 | Ht 63.0 in

## 2021-04-22 DIAGNOSIS — M9901 Segmental and somatic dysfunction of cervical region: Secondary | ICD-10-CM

## 2021-04-22 DIAGNOSIS — M9903 Segmental and somatic dysfunction of lumbar region: Secondary | ICD-10-CM

## 2021-04-22 DIAGNOSIS — M9908 Segmental and somatic dysfunction of rib cage: Secondary | ICD-10-CM | POA: Diagnosis not present

## 2021-04-22 DIAGNOSIS — G8929 Other chronic pain: Secondary | ICD-10-CM

## 2021-04-22 DIAGNOSIS — M545 Low back pain, unspecified: Secondary | ICD-10-CM | POA: Diagnosis not present

## 2021-04-22 DIAGNOSIS — M9902 Segmental and somatic dysfunction of thoracic region: Secondary | ICD-10-CM

## 2021-04-22 DIAGNOSIS — M9904 Segmental and somatic dysfunction of sacral region: Secondary | ICD-10-CM | POA: Diagnosis not present

## 2021-04-22 DIAGNOSIS — M5442 Lumbago with sciatica, left side: Secondary | ICD-10-CM | POA: Diagnosis not present

## 2021-04-22 DIAGNOSIS — M5441 Lumbago with sciatica, right side: Secondary | ICD-10-CM

## 2021-04-22 MED ORDER — KETOROLAC TROMETHAMINE 30 MG/ML IJ SOLN
30.0000 mg | Freq: Once | INTRAMUSCULAR | Status: AC
Start: 2021-04-22 — End: 2021-04-22
  Administered 2021-04-22: 30 mg via INTRAMUSCULAR

## 2021-04-22 MED ORDER — GABAPENTIN 100 MG PO CAPS
200.0000 mg | ORAL_CAPSULE | Freq: Every day | ORAL | 0 refills | Status: DC
Start: 2021-04-22 — End: 2021-06-16

## 2021-04-22 MED ORDER — CYCLOBENZAPRINE HCL 5 MG PO TABS: 5.0000 mg | ORAL_TABLET | Freq: Three times a day (TID) | ORAL | 0 refills | Status: DC | PRN

## 2021-04-22 NOTE — Patient Instructions (Addendum)
Start meloxicam tmrw Gabapentin '200mg'$  at night Flexeril if really  needed See me in 2-3 weeks

## 2021-04-23 ENCOUNTER — Other Ambulatory Visit: Payer: Self-pay | Admitting: Internal Medicine

## 2021-04-23 ENCOUNTER — Encounter: Payer: Self-pay | Admitting: Family Medicine

## 2021-04-23 ENCOUNTER — Ambulatory Visit (INDEPENDENT_AMBULATORY_CARE_PROVIDER_SITE_OTHER): Payer: No Typology Code available for payment source

## 2021-04-23 DIAGNOSIS — M545 Low back pain, unspecified: Secondary | ICD-10-CM | POA: Diagnosis not present

## 2021-04-23 NOTE — Assessment & Plan Note (Signed)
Chronic pain with exacerbation.  Concern with some of the radicular symptoms patient is having.  We will start on gabapentin 200 mg as well as given the cyclobenzaprine.  Patient has responded to this previously.  Patient has been on prednisone recently and we will also do the meloxicam with bridging her at this time.  Patient will get new x-rays to further evaluate.  Icing regimen.  Responded relatively well to osteopathic manipulation and given injection of Toradol.  Follow-up with me again in 4 to 6 weeks

## 2021-04-24 ENCOUNTER — Encounter: Payer: Self-pay | Admitting: Family Medicine

## 2021-04-24 ENCOUNTER — Other Ambulatory Visit: Payer: Self-pay

## 2021-04-24 ENCOUNTER — Ambulatory Visit: Payer: No Typology Code available for payment source | Admitting: Sports Medicine

## 2021-04-24 VITALS — BP 150/100 | HR 137 | Ht 63.0 in | Wt 198.0 lb

## 2021-04-24 DIAGNOSIS — G8929 Other chronic pain: Secondary | ICD-10-CM

## 2021-04-24 DIAGNOSIS — M5442 Lumbago with sciatica, left side: Secondary | ICD-10-CM | POA: Diagnosis not present

## 2021-04-24 DIAGNOSIS — R29898 Other symptoms and signs involving the musculoskeletal system: Secondary | ICD-10-CM

## 2021-04-24 NOTE — Patient Instructions (Addendum)
Good to see you  MRI lumbar spine  Increase to gabapentin '200mg'$  3 times a day  Increase flexeril to '10mg'$  at night Continue meloxicam  Start home exercises  Follow up early next week after MRI has been resulted

## 2021-04-24 NOTE — Progress Notes (Signed)
Benito Mccreedy D.[place] Phone: [phone]   Assessment and Plan:     1. Chronic bilateral low back pain with left-sided sciatica -Acute with uncertain prognosis, worsening, subsequent sports medicine visit - Concern for herniated disc based on patient's significant back pain radiating to left leg, left leg weakness - Ordered MRI lumbar spine which should be performed in an urgent fashion due to leg weakness - Increase to gabapentin 200 mg 3 times daily - Increase to Flexeril 10 mg nightly - Continue meloxicam - Start HEP for herniated disc.  Handout provided -Reviewed x-ray from previous office visit.  My interpretation: No acute fracture, good disc spacing, no significant scoliosis  Pertinent previous records reviewed include x-ray, telephone encounters, previous sports medicine visit   Follow Up: Next week after MRI to review MRI results   Subjective:   I, Judy Pimple, am serving as a scribe for Dr. Glennon Mac  Chief Complaint: Back pain   HPI: 55 year old female presenting with Left lower back pain   04/24/21 Patient states she has been having harp left glute pain that she cannot get any relief from. Patient is unable to get any sleep. Patient was last seen by Dr. Tamala Julian on the 14th for this issue. Patient has tried meloxicam, gabapentin, tylenol, and flexeril with no relief.Patient is unable to sit because of the pain, husband states she seems worse. Patient states walking is better than standing still, sitting or laying.  Completed course of prednisone before seeing Dr. Tamala Julian.  Relevant Historical Information: History of chronic low back pain  Additional pertinent review of systems negative.   Current Outpatient Medications:    augmented betamethasone dipropionate (DIPROLENE-AF) 0.05 % cream, Apply topically 2 (two) times daily as needed., Disp: 150 g, Rfl: 1   betamethasone  dipropionate 0.05 % cream, , Disp: , Rfl:    buPROPion (WELLBUTRIN XL) 300 MG 24 hr tablet, Take 300 mg daily by mouth., Disp: , Rfl: 3   cetirizine (ZYRTEC) 10 MG tablet, Take 10 mg by mouth as needed., Disp: , Rfl:    cyclobenzaprine (FLEXERIL) 5 MG tablet, Take 1 tablet (5 mg total) by mouth 3 (three) times daily as needed for muscle spasms., Disp: 30 tablet, Rfl: 0   drospirenone-ethinyl estradiol (YAZ) 3-0.02 MG per tablet, Take 1 tablet by mouth daily., Disp: , Rfl:    fexofenadine (ALLEGRA) 180 MG tablet, Take 180 mg by mouth as needed., Disp: , Rfl:    gabapentin (NEURONTIN) 100 MG capsule, Take 2 capsules (200 mg total) by mouth at bedtime., Disp: 180 capsule, Rfl: 0   guaiFENesin-codeine (ROBITUSSIN AC) 100-10 MG/5ML syrup, Take 5 mLs by mouth 3 (three) times daily as needed for cough., Disp: 118 mL, Rfl: 0   KLONOPIN 0.5 MG tablet, Take 1 tablet by mouth twice a day as needed TKE 1/2-1 AS NEEDED, Disp: , Rfl:    Melatonin 3 MG TABS, Take by mouth at bedtime as needed., Disp: , Rfl:    meloxicam (MOBIC) 15 MG tablet, Take 1 tablet (15 mg total) by mouth daily. (Patient taking differently: Take 15 mg by mouth daily as needed.), Disp: 90 tablet, Rfl: 1   montelukast (SINGULAIR) 10 MG tablet, TAKE 1 TABLET BY MOUTH AT  BEDTIME, Disp: 90 tablet, Rfl: 3   olopatadine (PATANOL) 0.1 % ophthalmic solution, INSTILL 1 DROP INTO BOTH  EYES TWICE DAILY., Disp: 15 mL, Rfl: 5   omeprazole (PRILOSEC) 20  MG capsule, TAKE 1 CAPSULE BY MOUTH  EVERY OTHER DAY, Disp: 45 capsule, Rfl: 3   Phenylephrine HCl 5 MG TABS, Take by mouth as needed., Disp: , Rfl:    predniSONE (DELTASONE) 20 MG tablet, Take 40 mg by mouth daily with breakfast., Disp: , Rfl:    pseudoephedrine (SUDAFED) 120 MG 12 hr tablet, Take 1 tablet (120 mg total) by mouth 2 (two) times daily as needed for congestion., Disp: 90 tablet, Rfl: 0   simvastatin (ZOCOR) 20 MG tablet, TAKE 1 TABLET BY MOUTH AT  BEDTIME, Disp: 90 tablet, Rfl: 3    vortioxetine HBr (TRINTELLIX) 20 MG TABS tablet, Trintellix 20 mg tablet  TK 1 T PO D, Disp: , Rfl:    Objective:     There were no vitals filed for this visit.    There is no height or weight on file to calculate BMI.    Physical Exam:    Gen: Appears well, nad, nontoxic and pleasant Psych: Alert and oriented, appropriate mood and affect Neuro: muscle tone wnl Skin: no susupicious lesions or rashes  Back - Normal skin, Spine with normal alignment and no deformity.   No tenderness to vertebral process palpation.   Paraspinous muscles are significantly tender bilaterally, worse on left and with spasm Unable to perform straight leg raise or Trendelenburg due to patient pain with positioning Decreased sensation over left lateral leg and L4 distribution 4/5 hip flexion on left, 5/5 hip flexion on right  Electronically signed by:  Benito Mccreedy D.[facility] 12:01 PM 04/24/21

## 2021-04-26 ENCOUNTER — Other Ambulatory Visit: Payer: Self-pay

## 2021-04-26 ENCOUNTER — Ambulatory Visit (INDEPENDENT_AMBULATORY_CARE_PROVIDER_SITE_OTHER): Payer: No Typology Code available for payment source

## 2021-04-26 DIAGNOSIS — G8929 Other chronic pain: Secondary | ICD-10-CM

## 2021-04-26 DIAGNOSIS — M79605 Pain in left leg: Secondary | ICD-10-CM | POA: Diagnosis not present

## 2021-04-26 DIAGNOSIS — R29898 Other symptoms and signs involving the musculoskeletal system: Secondary | ICD-10-CM

## 2021-04-26 DIAGNOSIS — M5442 Lumbago with sciatica, left side: Secondary | ICD-10-CM | POA: Diagnosis not present

## 2021-04-28 ENCOUNTER — Other Ambulatory Visit: Payer: Self-pay

## 2021-04-28 ENCOUNTER — Ambulatory Visit (INDEPENDENT_AMBULATORY_CARE_PROVIDER_SITE_OTHER): Payer: No Typology Code available for payment source

## 2021-04-28 ENCOUNTER — Ambulatory Visit: Payer: No Typology Code available for payment source | Admitting: Family Medicine

## 2021-04-28 DIAGNOSIS — M545 Low back pain, unspecified: Secondary | ICD-10-CM

## 2021-04-28 DIAGNOSIS — M2550 Pain in unspecified joint: Secondary | ICD-10-CM

## 2021-04-29 ENCOUNTER — Other Ambulatory Visit (INDEPENDENT_AMBULATORY_CARE_PROVIDER_SITE_OTHER): Payer: No Typology Code available for payment source

## 2021-04-29 DIAGNOSIS — M2550 Pain in unspecified joint: Secondary | ICD-10-CM

## 2021-04-29 DIAGNOSIS — M545 Low back pain, unspecified: Secondary | ICD-10-CM

## 2021-04-29 LAB — CBC WITH DIFFERENTIAL/PLATELET
Basophils Absolute: 0.1 10*3/uL (ref 0.0–0.1)
Basophils Relative: 0.8 % (ref 0.0–3.0)
Eosinophils Absolute: 0.2 10*3/uL (ref 0.0–0.7)
Eosinophils Relative: 1.9 % (ref 0.0–5.0)
HCT: 38.4 % (ref 36.0–46.0)
Hemoglobin: 12.9 g/dL (ref 12.0–15.0)
Lymphocytes Relative: 23.6 % (ref 12.0–46.0)
Lymphs Abs: 2.1 10*3/uL (ref 0.7–4.0)
MCHC: 33.5 g/dL (ref 30.0–36.0)
MCV: 82.3 fl (ref 78.0–100.0)
Monocytes Absolute: 0.8 10*3/uL (ref 0.1–1.0)
Monocytes Relative: 8.5 % (ref 3.0–12.0)
Neutro Abs: 5.8 10*3/uL (ref 1.4–7.7)
Neutrophils Relative %: 65.2 % (ref 43.0–77.0)
Platelets: 536 10*3/uL — ABNORMAL HIGH (ref 150.0–400.0)
RBC: 4.67 Mil/uL (ref 3.87–5.11)
RDW: 13.3 % (ref 11.5–15.5)
WBC: 8.9 10*3/uL (ref 4.0–10.5)

## 2021-04-29 LAB — C-REACTIVE PROTEIN: CRP: 1.4 mg/dL (ref 0.5–20.0)

## 2021-04-29 LAB — COMPREHENSIVE METABOLIC PANEL
ALT: 30 U/L (ref 0–35)
AST: 20 U/L (ref 0–37)
Albumin: 3.7 g/dL (ref 3.5–5.2)
Alkaline Phosphatase: 120 U/L — ABNORMAL HIGH (ref 39–117)
BUN: 12 mg/dL (ref 6–23)
CO2: 26 mEq/L (ref 19–32)
Calcium: 8.9 mg/dL (ref 8.4–10.5)
Chloride: 102 mEq/L (ref 96–112)
Creatinine, Ser: 0.61 mg/dL (ref 0.40–1.20)
GFR: 102.38 mL/min (ref >60.00)
Glucose, Bld: 96 mg/dL (ref 70–99)
Potassium: 3.5 mEq/L (ref 3.5–5.1)
Sodium: 137 mEq/L (ref 135–145)
Total Bilirubin: 0.4 mg/dL (ref 0.2–1.2)
Total Protein: 7.2 g/dL (ref 6.0–8.3)

## 2021-04-29 LAB — SEDIMENTATION RATE: Sed Rate: 41 mm/hr — ABNORMAL HIGH (ref 0–30)

## 2021-04-29 LAB — IBC PANEL
Iron: 63 ug/dL (ref 42–145)
Saturation Ratios: 15.9 % — ABNORMAL LOW (ref 20.0–50.0)
TIBC: 396.2 ug/dL (ref 250.0–450.0)
Transferrin: 283 mg/dL (ref 212.0–360.0)

## 2021-04-29 LAB — FERRITIN: Ferritin: 87.8 ng/mL (ref 10.0–291.0)

## 2021-04-29 LAB — TSH: TSH: 1.39 u[IU]/mL (ref 0.35–5.50)

## 2021-04-29 LAB — VITAMIN D 25 HYDROXY (VIT D DEFICIENCY, FRACTURES): VITD: 24.36 ng/mL — ABNORMAL LOW (ref 30.00–100.00)

## 2021-04-29 LAB — URIC ACID: Uric Acid, Serum: 4.5 mg/dL (ref 2.4–7.0)

## 2021-04-30 ENCOUNTER — Encounter: Payer: Self-pay | Admitting: Family Medicine

## 2021-04-30 ENCOUNTER — Ambulatory Visit (INDEPENDENT_AMBULATORY_CARE_PROVIDER_SITE_OTHER): Payer: No Typology Code available for payment source | Admitting: Family Medicine

## 2021-04-30 ENCOUNTER — Other Ambulatory Visit: Payer: Self-pay

## 2021-04-30 DIAGNOSIS — G8929 Other chronic pain: Secondary | ICD-10-CM | POA: Diagnosis not present

## 2021-04-30 DIAGNOSIS — M5442 Lumbago with sciatica, left side: Secondary | ICD-10-CM

## 2021-04-30 MED ORDER — VITAMIN D (ERGOCALCIFEROL) 1.25 MG (50000 UNIT) PO CAPS: 50000.0000 [IU] | ORAL_CAPSULE | ORAL | 0 refills | Status: DC

## 2021-04-30 NOTE — Progress Notes (Signed)
Virtual Visit via Video Note  I connected with Jessica [name] on 04/30/21 at 11:45 AM EDT by a video enabled telemedicine application and verified that I am speaking with the correct person using two identifiers.  Patient is alone on the phone call.  Location: Patient: In home setting. Provider: In office setting   I discussed the limitations of evaluation and management by telemedicine and the availability of in person appointments. The patient expressed understanding and agreed to proceed.  History of Present Illness: Patient is a very pleasant 55 year old female who was having lumbar back pain.  Sent for an MRI that did show an L2 nerve root impingement.  Patient has been taking the medications as prescribed and has made some progress.  Patient would like to try to increase activity.  Only having pain when she is sitting at this time.  Still have some very mild numbness in lower legs but nothing that it contributes to affecting her daily activities at the moment.    Observations/Objective: Alert and oriented x3   Assessment and Plan: Lumbar radiculopathy that seems to be resolving somewhat on its own.  Continue the gabapentin and the muscle relaxers.  Discussed with patient including referral for once weekly vitamin D with patient having laboratory work-up showing low vitamin D but otherwise fairly unremarkable.  We discussed if worsening pain again would consider the possibility of a CT abdomen and pelvis with this or potentially the epidural of the back at the L2-L3 level.  Patient will follow-up with me again in 4 weeks otherwise.   Follow Up Instructions: 4 weeks    I discussed the assessment and treatment plan with the patient. The patient was provided an opportunity to ask questions and all were answered. The patient agreed with the plan and demonstrated an understanding of the instructions.   The patient was advised to call back or seek an in-person evaluation if the  symptoms worsen or if the condition fails to improve as anticipated.  I provided 19minutes of non-face-to-face time during this encounter.  This includes his reading patient's notes from another provider, reviewing patient's MRI independently, as well as laboratory work-up and discussing different medical management with patient.   Lyndal Pulley, DO

## 2021-05-01 LAB — CALCIUM, IONIZED: Calcium, Ion: 5.09 mg/dL (ref 4.8–5.6)

## 2021-05-01 LAB — PTH, INTACT AND CALCIUM
Calcium: 9.3 mg/dL (ref 8.6–10.4)
PTH: 44 pg/mL (ref 16–77)

## 2021-05-01 LAB — RHEUMATOID FACTOR: Rheumatoid fact SerPl-aCnc: <14 IU/mL (ref <14)

## 2021-05-01 LAB — ANGIOTENSIN CONVERTING ENZYME: Angiotensin-Converting Enzyme: 14 U/L (ref 9–67)

## 2021-05-01 LAB — ANA: Anti Nuclear Antibody (ANA): NEGATIVE

## 2021-05-01 LAB — CYCLIC CITRUL PEPTIDE ANTIBODY, IGG: Cyclic Citrullin Peptide Ab: <16 UNITS

## 2021-05-13 ENCOUNTER — Encounter: Payer: Self-pay | Admitting: Family Medicine

## 2021-05-13 ENCOUNTER — Other Ambulatory Visit: Payer: Self-pay

## 2021-05-13 MED ORDER — MELOXICAM 15 MG PO TABS: 15.0000 mg | ORAL_TABLET | Freq: Every day | ORAL | 1 refills | Status: DC

## 2021-06-04 ENCOUNTER — Other Ambulatory Visit: Payer: Self-pay | Admitting: Internal Medicine

## 2021-06-16 ENCOUNTER — Other Ambulatory Visit: Payer: Self-pay

## 2021-06-16 ENCOUNTER — Encounter: Payer: Self-pay | Admitting: Family Medicine

## 2021-06-16 ENCOUNTER — Other Ambulatory Visit: Payer: Self-pay | Admitting: Family Medicine

## 2021-06-16 MED ORDER — GABAPENTIN 100 MG PO CAPS: 200.0000 mg | ORAL_CAPSULE | Freq: Three times a day (TID) | ORAL | 0 refills | Status: DC

## 2021-07-16 ENCOUNTER — Other Ambulatory Visit: Payer: Self-pay | Admitting: Internal Medicine

## 2021-07-23 ENCOUNTER — Other Ambulatory Visit: Payer: Self-pay | Admitting: Family Medicine

## 2021-09-11 ENCOUNTER — Other Ambulatory Visit: Payer: Self-pay | Admitting: Family Medicine

## 2021-10-21 ENCOUNTER — Ambulatory Visit: Payer: No Typology Code available for payment source | Admitting: Internal Medicine

## 2021-10-22 ENCOUNTER — Ambulatory Visit: Payer: No Typology Code available for payment source | Admitting: Family Medicine

## 2021-11-09 ENCOUNTER — Encounter: Payer: Self-pay | Admitting: Internal Medicine

## 2021-11-09 DIAGNOSIS — E559 Vitamin D deficiency, unspecified: Secondary | ICD-10-CM | POA: Insufficient documentation

## 2021-11-09 NOTE — Patient Instructions (Addendum)
? ?Monitor your BP at home - goal is less than 140/90.  ? ? ?Blood work was ordered.   ? ? ?Medications changes include :  none  ? ? ? ? ?Return in about 1 year (around 11/11/2022) for CPE. ? ? ? ?Health Maintenance, Female ?Adopting a healthy lifestyle and getting preventive care are important in promoting health and wellness. Ask your health care provider about: ?The right schedule for you to have regular tests and exams. ?Things you can do on your own to prevent diseases and keep yourself healthy. ?What should I know about diet, weight, and exercise? ?Eat a healthy diet ? ?Eat a diet that includes plenty of vegetables, fruits, low-fat dairy products, and lean protein. ?Do not eat a lot of foods that are high in solid fats, added sugars, or sodium. ?Maintain a healthy weight ?Body mass index (BMI) is used to identify weight problems. It estimates body fat based on height and weight. Your health care provider can help determine your BMI and help you achieve or maintain a healthy weight. ?Get regular exercise ?Get regular exercise. This is one of the most important things you can do for your health. Most adults should: ?Exercise for at least 150 minutes each week. The exercise should increase your heart rate and make you sweat (moderate-intensity exercise). ?Do strengthening exercises at least twice a week. This is in addition to the moderate-intensity exercise. ?Spend less time sitting. Even light physical activity can be beneficial. ?Watch cholesterol and blood lipids ?Have your blood tested for lipids and cholesterol at 54 years of age, then have this test every 5 years. ?Have your cholesterol levels checked more often if: ?Your lipid or cholesterol levels are high. ?You are older than 54 years of age. ?You are at high risk for heart disease. ?What should I know about cancer screening? ?Depending on your health history and family history, you may need to have cancer screening at various ages. This may include  screening for: ?Breast cancer. ?Cervical cancer. ?Colorectal cancer. ?Skin cancer. ?Lung cancer. ?What should I know about heart disease, diabetes, and high blood pressure? ?Blood pressure and heart disease ?High blood pressure causes heart disease and increases the risk of stroke. This is more likely to develop in people who have high blood pressure readings or are overweight. ?Have your blood pressure checked: ?Every 3-5 years if you are 60-51 years of age. ?Every year if you are 23 years old or older. ?Diabetes ?Have regular diabetes screenings. This checks your fasting blood sugar level. Have the screening done: ?Once every three years after age 45 if you are at a normal weight and have a low risk for diabetes. ?More often and at a younger age if you are overweight or have a high risk for diabetes. ?What should I know about preventing infection? ?Hepatitis B ?If you have a higher risk for hepatitis B, you should be screened for this virus. Talk with your health care provider to find out if you are at risk for hepatitis B infection. ?Hepatitis C ?Testing is recommended for: ?Everyone born from [birth year] through 1963. ?Anyone with known risk factors for hepatitis C. ?Sexually transmitted infections (STIs) ?Get screened for STIs, including gonorrhea and chlamydia, if: ?You are sexually active and are younger than 54 years of age. ?You are older than 54 years of age and your health care provider tells you that you are at risk for this type of infection. ?Your sexual activity has changed since you were last screened, and  you are at increased risk for chlamydia or gonorrhea. Ask your health care provider if you are at risk. ?Ask your health care provider about whether you are at high risk for HIV. Your health care provider may recommend a prescription medicine to help prevent HIV infection. If you choose to take medicine to prevent HIV, you should first get tested for HIV. You should then be tested every 3 months for as  long as you are taking the medicine. ?Pregnancy ?If you are about to stop having your period (premenopausal) and you may become pregnant, seek counseling before you get pregnant. ?Take 400 to 800 micrograms (mcg) of folic acid every day if you become pregnant. ?Ask for birth control (contraception) if you want to prevent pregnancy. ?Osteoporosis and menopause ?Osteoporosis is a disease in which the bones lose minerals and strength with aging. This can result in bone fractures. If you are 55 years old or older, or if you are at risk for osteoporosis and fractures, ask your health care provider if you should: ?Be screened for bone loss. ?Take a calcium or vitamin D supplement to lower your risk of fractures. ?Be given hormone replacement therapy (HRT) to treat symptoms of menopause. ?Follow these instructions at home: ?Alcohol use ?Do not drink alcohol if: ?Your health care provider tells you not to drink. ?You are pregnant, may be pregnant, or are planning to become pregnant. ?If you drink alcohol: ?Limit how much you have to: ?0-1 drink a day. ?Know how much alcohol is in your drink. In the [place] one drink equals one 12 oz bottle of beer (355 mL), one 5 oz glass of wine (148 mL), or one 1? oz glass of hard liquor (44 mL). ?Lifestyle ?Do not use any products that contain nicotine or tobacco. These products include cigarettes, chewing tobacco, and vaping devices, such as e-cigarettes. If you need help quitting, ask your health care provider. ?Do not use street drugs. ?Do not share needles. ?Ask your health care provider for help if you need support or information about quitting drugs. ?General instructions ?Schedule regular health, dental, and eye exams. ?Stay current with your vaccines. ?Tell your health care provider if: ?You often feel depressed. ?You have ever been abused or do not feel safe at home. ?Summary ?Adopting a healthy lifestyle and getting preventive care are important in promoting health and  wellness. ?Follow your health care provider's instructions about healthy diet, exercising, and getting tested or screened for diseases. ?Follow your health care provider's instructions on monitoring your cholesterol and blood pressure. ?This information is not intended to replace advice given to you by your health care provider. Make sure you discuss any questions you have with your health care provider. ?Document Revised: 12/15/2020 Document Reviewed: 12/15/2020 ?Elsevier Patient Education ? [employer]. ? ?

## 2021-11-09 NOTE — Progress Notes (Signed)
? ? ?Subjective:  ? ? Patient ID: Jessica [name], female    DOB: 09-05-1965, 56 y.o.   MRN: [id number] ? ? ?This visit occurred during the SARS-CoV-2 public health emergency.  Safety protocols were in place, including screening questions prior to the visit, additional usage of staff PPE, and extensive cleaning of exam room while observing appropriate contact time as indicated for disinfecting solutions. ? ? ? ?HPI ?Jessica [name] is here for  ?Chief Complaint  ?Patient presents with  ? Annual Exam  ? ? ?Lots of anxiety.  Dad has dementia and that has a rapid progression.  She is not sleeping well - wakes up early and can not get back to sleep.  Her dreams are intense. Has some tension headaches, clenches jaw or has muscle neck/upper back tightness.  She is seeing a therapist.  She also sees her psychiatrist. ? ?Still has some lumbar radiculopathy.  She is trying to exercise regularly, but the back has decreased how much and what she is doing. ? ? ? ? ?Medications and allergies reviewed with patient and updated if appropriate. ? ? ? ?Current Outpatient Medications on File Prior to Visit  ?Medication Sig Dispense Refill  ? augmented betamethasone dipropionate (DIPROLENE-AF) 0.05 % cream Apply topically 2 (two) times daily as needed. 150 g 1  ? betamethasone dipropionate 0.05 % cream     ? buPROPion (WELLBUTRIN XL) 300 MG 24 hr tablet Take 300 mg daily by mouth.  3  ? cetirizine (ZYRTEC) 10 MG tablet Take 10 mg by mouth as needed.    ? cyclobenzaprine (FLEXERIL) 5 MG tablet Take 1 tablet (5 mg total) by mouth 3 (three) times daily as needed for muscle spasms. 30 tablet 0  ? drospirenone-ethinyl estradiol (YAZ) 3-0.02 MG per tablet Take 1 tablet by mouth daily.    ? fexofenadine (ALLEGRA) 180 MG tablet Take 180 mg by mouth as needed.    ? guaiFENesin-codeine (ROBITUSSIN AC) 100-10 MG/5ML syrup Take 5 mLs by mouth 3 (three) times daily as needed for cough. 118 mL 0  ? KLONOPIN 0.5 MG tablet Take 1 tablet by mouth twice a  day as needed TKE 1/2-1 AS NEEDED    ? Melatonin 3 MG TABS Take by mouth at bedtime as needed.    ? montelukast (SINGULAIR) 10 MG tablet TAKE 1 TABLET BY MOUTH AT  BEDTIME 90 tablet 3  ? olopatadine (PATANOL) 0.1 % ophthalmic solution INSTILL 1 DROP INTO BOTH  EYES TWICE DAILY. 15 mL 5  ? omeprazole (PRILOSEC) 20 MG capsule TAKE 1 CAPSULE BY MOUTH  EVERY OTHER DAY 45 capsule 3  ? Phenylephrine HCl 5 MG TABS Take by mouth as needed.    ? pseudoephedrine (SUDAFED) 120 MG 12 hr tablet Take 1 tablet (120 mg total) by mouth 2 (two) times daily as needed for congestion. 90 tablet 0  ? simvastatin (ZOCOR) 20 MG tablet TAKE 1 TABLET BY MOUTH AT  BEDTIME 90 tablet 3  ? Vitamin D, Ergocalciferol, (DRISDOL) 1.25 MG (50000 UNIT) CAPS capsule Take 1 capsule (50,000 Units total) by mouth every 7 (seven) days. 12 capsule 0  ? vortioxetine HBr (TRINTELLIX) 20 MG TABS tablet Trintellix 20 mg tablet ? TK 1 T PO D    ? gabapentin (NEURONTIN) 100 MG capsule TAKE 2 CAPSULES BY MOUTH 3 TIMES A DAY (Patient not taking: Reported on 11/10/2021) 180 capsule 0  ? ?No current facility-administered medications on file prior to visit.  ? ? ?Review of Systems  ?Constitutional:  Negative for fever.  ?Eyes:  Positive for visual disturbance (cataracts).  ?Respiratory:  Negative for cough, shortness of breath and wheezing.   ?Cardiovascular:  Negative for chest pain, palpitations and leg swelling.  ?Gastrointestinal:  Positive for diarrhea (occ with IBS flares) and nausea (a little with anxiety). Negative for abdominal pain, blood in stool and constipation.  ?     Jerrye Bushy 2/week  ?Genitourinary:  Negative for dysuria.  ?Musculoskeletal:  Positive for back pain. Negative for arthralgias.  ?Skin:  Negative for rash.  ?     eczema  ?Neurological:  Positive for headaches (tenson, sinus). Negative for light-headedness.  ?Psychiatric/Behavioral:  Positive for dysphoric mood. The patient is nervous/anxious.   ? ?   ?Objective:  ? ?Vitals:  ? 11/10/21 1302  11/10/21 1349  ?BP: (!) 148/78 134/74  ?Pulse: 84   ?Temp: 98.4 ?F (36.9 ?C)   ?SpO2: 99%   ? ?Filed Weights  ? 11/10/21 1302  ?Weight: 190 lb 6.4 oz (86.4 kg)  ? ?Body mass index is 33.73 kg/m?. ? ?BP Readings from Last 3 Encounters:  ?11/10/21 134/74  ?04/24/21 (!) 150/100  ?04/22/21 132/90  ? ? ?Wt Readings from Last 3 Encounters:  ?11/10/21 190 lb 6.4 oz (86.4 kg)  ?04/24/21 198 lb (89.8 kg)  ?11/28/20 198 lb (89.8 kg)  ? ? ? ?  11/10/2021  ?  1:08 PM 06/05/2018  ?  9:17 AM  ?Depression screen PHQ 2/9  ?Decreased Interest 0 0  ?Down, Depressed, Hopeless 0 0  ?PHQ - 2 Score 0 0  ?Altered sleeping  0  ?Tired, decreased energy  0  ?Change in appetite  0  ?Feeling bad or failure about yourself   0  ?Trouble concentrating  0  ?Moving slowly or fidgety/restless  0  ?Suicidal thoughts  0  ?PHQ-9 Score  0  ? ? ? ?   ? View : No data to display.  ?  ?  ?  ? ? ? ? ?  ?Physical Exam ?Constitutional: She appears well-developed and well-nourished. No distress.  ?HENT:  ?Head: Normocephalic and atraumatic.  ?Right Ear: External ear normal. Normal ear canal and TM ?Left Ear: External ear normal.  Normal ear canal and TM ?Mouth/Throat: Oropharynx is clear and moist.  ?Eyes: Conjunctivae and EOM are normal.  ?Neck: Neck supple. No tracheal deviation present. No thyromegaly present.  ?No carotid bruit  ?Cardiovascular: Normal rate, regular rhythm and normal heart sounds.   ?No murmur heard.  No edema. ?Pulmonary/Chest: Effort normal and breath sounds normal. No respiratory distress. She has no wheezes. She has no rales.  ?Breast: deferred   ?Abdominal: Soft. She exhibits no distension. There is no tenderness.  ?Lymphadenopathy: She has no cervical adenopathy.  ?Skin: Skin is warm and dry. She is not diaphoretic.  ?Psychiatric: Mood is anxious.  Her behavior is normal.  ? ? ? ?Lab Results  ?Component Value Date  ? WBC 8.9 04/29/2021  ? HGB 12.9 04/29/2021  ? HCT 38.4 04/29/2021  ? PLT 536.0 (H) 04/29/2021  ? GLUCOSE 96 04/29/2021  ?  CHOL 202 (H) 06/05/2018  ? TRIG 386.0 (H) 06/05/2018  ? HDL 61.00 06/05/2018  ? LDLDIRECT 87.0 06/05/2018  ? ALT 30 04/29/2021  ? AST 20 04/29/2021  ? NA 137 04/29/2021  ? K 3.5 04/29/2021  ? CL 102 04/29/2021  ? CREATININE 0.61 04/29/2021  ? BUN 12 04/29/2021  ? CO2 26 04/29/2021  ? TSH 1.39 04/29/2021  ? HGBA1C 5.3 06/05/2018  ? ? ? ? ?   ?  Assessment & Plan:  ? ?Physical exam: ?Screening blood work  ordered ?Exercise  doing some walking - limited by back pain-will discuss with Dr. Tamala Julian if she can do low intensity Zumba ?Weight  discussed weight loss ?Substance abuse  none ? ? ?Reviewed recommended immunizations. ? ?Pap and mammo up to date-we will get reports ? ?She will let us know when she wants to do Cologuard ? ?Health Maintenance  ?Topic Date Due  ? Fecal DNA (Cologuard)  Never done  ? MAMMOGRAM  06/14/2019  ? PAP SMEAR-Modifier  06/06/2021  ? Zoster Vaccines- Shingrix (1 of 2) 02/09/2022 (Originally [birth year]1987)  ? INFLUENZA VACCINE  03/09/2022  ? TETANUS/TDAP  06/06/2029  ? COVID-19 Vaccine  Completed  ? HIV Screening  Completed  ? HPV VACCINES  Aged Out  ? Hepatitis C Screening  Discontinued  ?  ? ? ? ? ? ? ?See Problem List for Assessment and Plan of chronic medical problems. ? ? ? ? ?

## 2021-11-10 ENCOUNTER — Other Ambulatory Visit: Payer: Self-pay | Admitting: Family Medicine

## 2021-11-10 ENCOUNTER — Ambulatory Visit (INDEPENDENT_AMBULATORY_CARE_PROVIDER_SITE_OTHER): Payer: No Typology Code available for payment source | Admitting: Internal Medicine

## 2021-11-10 VITALS — BP 134/74 | HR 84 | Temp 98.4°F | Ht 63.0 in | Wt 190.4 lb

## 2021-11-10 DIAGNOSIS — K219 Gastro-esophageal reflux disease without esophagitis: Secondary | ICD-10-CM | POA: Diagnosis not present

## 2021-11-10 DIAGNOSIS — F411 Generalized anxiety disorder: Secondary | ICD-10-CM

## 2021-11-10 DIAGNOSIS — E782 Mixed hyperlipidemia: Secondary | ICD-10-CM

## 2021-11-10 DIAGNOSIS — J301 Allergic rhinitis due to pollen: Secondary | ICD-10-CM

## 2021-11-10 DIAGNOSIS — F32A Depression, unspecified: Secondary | ICD-10-CM

## 2021-11-10 DIAGNOSIS — R739 Hyperglycemia, unspecified: Secondary | ICD-10-CM

## 2021-11-10 DIAGNOSIS — E559 Vitamin D deficiency, unspecified: Secondary | ICD-10-CM

## 2021-11-10 DIAGNOSIS — Z0000 Encounter for general adult medical examination without abnormal findings: Secondary | ICD-10-CM | POA: Diagnosis not present

## 2021-11-10 NOTE — Assessment & Plan Note (Signed)
Chronic ?Regular exercise and healthy diet encouraged ?Check lipid panel  ?Continue simvastatin 20 mg at bedtime ?

## 2021-11-10 NOTE — Assessment & Plan Note (Signed)
Chronic ?Taking singulair and xyzal - continue ?

## 2021-11-10 NOTE — Assessment & Plan Note (Signed)
Chronic ?Management per psychiatric nurse practitioner ?

## 2021-11-10 NOTE — Assessment & Plan Note (Signed)
Chronic Taking vitamin D daily Check vitamin D level  

## 2021-11-10 NOTE — Assessment & Plan Note (Addendum)
Chronic ?Increased - related to father's dementia ?Seeing a therapist ?Management per psychiatric nurse practitioner ?Continue regular exercise ?

## 2021-11-10 NOTE — Progress Notes (Signed)
?Charlann Boxer D.O. ?[facility] ?[place] ?Phone: [phone] ?Subjective:   ?I, Vilma Meckel, am serving as a Education administrator for Dr. Hulan Saas. ?This visit occurred during the SARS-CoV-2 public health emergency.  Safety protocols were in place, including screening questions prior to the visit, additional usage of staff PPE, and extensive cleaning of exam room while observing appropriate contact time as indicated for disinfecting solutions.  ? ?I'm seeing this patient by the request  of:  Binnie Rail, MD ? ?CC: Low back pain follow-up ? ?[id number]  ?Annajulia Lewing is a 56 y.o. female coming in with complaint of R hip pain. Last seen in Sept 2022 for LBP. Patient states doing better. Feels like pain is a nagging 2/10. Sitting triggers the pain and she is going on a summer vacation to [place]. Wants to know what she can do to combat the pain. Nothing new to speak about, just checking in. ? ?MRI lumbar 04/26/2021 ?IMPRESSION: ?1. Suspect tiny far-lateral disc extrusion at L2-3 which contacts ?the left L2 nerve root. ?2. Otherwise unremarkable study. ?  ?  ? ?Past Medical History:  ?Diagnosis Date  ? ALLERGIC RHINITIS CAUSE UNSPECIFIED   ? ANXIETY DISORDER   ? Complication of anesthesia   ? CYSTIC ACNE   ? DEPRESSION   ? DYSPLASTIC NEVUS, CHEST   ? ELEVATED BP READING WITHOUT DX HYPERTENSION   ? GERD (gastroesophageal reflux disease)   ? INSOMNIA   ? Mixed hyperlipidemia   ? PONV (postoperative nausea and vomiting)   ? THROMBOCYTOSIS   ? ?Past Surgical History:  ?Procedure Laterality Date  ? BREAST SURGERY  2000  ? Breast reduction  ? CHOLECYSTECTOMY  05/04/2018  ? CHOLECYSTECTOMY N/A 05/04/2018  ? Procedure: LAPAROSCOPIC CHOLECYSTECTOMY;  Surgeon: Georganna Skeans, MD;  Location: [facility];  Service: General;  Laterality: N/A;  ? Deviated septum repair  1997  ? ?Social History  ? ?Socioeconomic History  ? Marital status: Married  ?  Spouse name: Eddie Dibbles  ? Number of children:  Not on file  ? Years of education: Not on file  ? Highest education level: Not on file  ?Occupational History  ? Not on file  ?Tobacco Use  ? Smoking status: Never  ? Smokeless tobacco: Never  ?Vaping Use  ? Vaping Use: Never used  ?Substance and Sexual Activity  ? Alcohol use: No  ? Drug use: No  ? Sexual activity: Not on file  ?Other Topics Concern  ? Not on file  ?Social History Narrative  ? Married lives with spouse and two kids. She is originally from [place] area-moved to [place] 2004  ? ?Social Determinants of Health  ? ?Financial Resource Strain: Not on file  ?Food Insecurity: Not on file  ?Transportation Needs: Not on file  ?Physical Activity: Not on file  ?Stress: Not on file  ?Social Connections: Not on file  ? ?No Known Allergies ?Family History  ?Problem Relation Age of Onset  ? Hyperlipidemia Father   ? Hypertension Father   ? Depression Sister   ? Coronary artery disease Paternal Grandfather   ? ? ?Current Outpatient Medications (Endocrine & Metabolic):  ?  drospirenone-ethinyl estradiol (YAZ) 3-0.02 MG per tablet, Take 1 tablet by mouth daily. ? ?Current Outpatient Medications (Cardiovascular):  ?  simvastatin (ZOCOR) 20 MG tablet, TAKE 1 TABLET BY MOUTH AT  BEDTIME ? ?Current Outpatient Medications (Respiratory):  ?  cetirizine (ZYRTEC) 10 MG tablet, Take 10 mg by mouth as needed. ?  fexofenadine (ALLEGRA) 180 MG tablet, Take 180 mg by mouth as needed. ?  guaiFENesin-codeine (ROBITUSSIN AC) 100-10 MG/5ML syrup, Take 5 mLs by mouth 3 (three) times daily as needed for cough. ?  montelukast (SINGULAIR) 10 MG tablet, TAKE 1 TABLET BY MOUTH AT  BEDTIME ?  Phenylephrine HCl 5 MG TABS, Take by mouth as needed. ?  pseudoephedrine (SUDAFED) 120 MG 12 hr tablet, Take 1 tablet (120 mg total) by mouth 2 (two) times daily as needed for congestion. ? ?Current Outpatient Medications (Analgesics):  ?  meloxicam (MOBIC) 15 MG tablet, TAKE 1 TABLET (15 MG TOTAL) BY MOUTH DAILY. (Patient not taking: Reported on  11/10/2021) ? ? ?Current Outpatient Medications (Other):  ?  augmented betamethasone dipropionate (DIPROLENE-AF) 0.05 % cream, Apply topically 2 (two) times daily as needed. ?  betamethasone dipropionate 0.05 % cream,  ?  buPROPion (WELLBUTRIN XL) 300 MG 24 hr tablet, Take 300 mg daily by mouth. ?  cyclobenzaprine (FLEXERIL) 5 MG tablet, Take 1 tablet (5 mg total) by mouth 3 (three) times daily as needed for muscle spasms. ?  gabapentin (NEURONTIN) 100 MG capsule, TAKE 2 CAPSULES BY MOUTH 3 TIMES A DAY (Patient not taking: Reported on 11/10/2021) ?  KLONOPIN 0.5 MG tablet, Take 1 tablet by mouth twice a day as needed TKE 1/2-1 AS NEEDED ?  Melatonin 3 MG TABS, Take by mouth at bedtime as needed. ?  olopatadine (PATANOL) 0.1 % ophthalmic solution, INSTILL 1 DROP INTO BOTH  EYES TWICE DAILY. ?  omeprazole (PRILOSEC) 20 MG capsule, TAKE 1 CAPSULE BY MOUTH  EVERY OTHER DAY ?  vortioxetine HBr (TRINTELLIX) 20 MG TABS tablet, Trintellix 20 mg tablet  TK 1 T PO D ? ? ?Reviewed prior external information including notes and imaging from  ?primary care provider ?As well as notes that were available from care everywhere and other healthcare systems. ? ?Past medical history, social, surgical and family history all reviewed in electronic medical record.  No pertanent information unless stated regarding to the chief complaint.  ? ?Review of Systems: ? No headache, visual changes, nausea, vomiting, diarrhea, constipation, dizziness, abdominal pain, skin rash, fevers, chills, night sweats, weight loss, swollen lymph nodes, body aches, joint swelling, chest pain, shortness of breath, mood changes. POSITIVE muscle aches ? ?Objective  ?Blood pressure (!) 130/92, pulse 100, height '5\' 3"'$  (1.6 m), weight 188 lb (85.3 kg), last menstrual period 12/15/2011, SpO2 96 %. ?  ?General: No apparent distress alert and oriented x3 mood and affect normal, dressed appropriately.  ?HEENT: Pupils equal, extraocular movements intact  ?Respiratory:  Patient's speak in full sentences and does not appear short of breath  ?Cardiovascular: No lower extremity edema, non tender, no erythema  ?Gait normal with good balance and coordination.  ?MSK: Low back exam does have some loss of lordosis.  Tenderness to palpation in the paraspinal musculature.  Patient still has some discomfort more over the sacroiliac joint left greater than right pelvic shear noted. ? ?Osteopathic findings ?C2 flexed rotated and side bent right ?C4 flexed rotated and side bent left ?C7 flexed rotated and side bent left ?T3 extended rotated and side bent right inhaled third rib ?T6 extended rotated and side bent left ?L2 flexed rotated and side bent right ?Sacrum left on left  ?Pelvic shear noted ? ?  ?Impression and Recommendations:  ?  ?The above documentation has been reviewed and is accurate and complete Lyndal Pulley, DO ? ? ? ?

## 2021-11-10 NOTE — Assessment & Plan Note (Signed)
Chronic Check a1c Low sugar / carb diet Stressed regular exercise  

## 2021-11-10 NOTE — Assessment & Plan Note (Signed)
Chronic GERD controlled Continue omeprazole 20 mg every other day  

## 2021-11-11 ENCOUNTER — Other Ambulatory Visit: Payer: Self-pay | Admitting: Internal Medicine

## 2021-11-11 ENCOUNTER — Ambulatory Visit (INDEPENDENT_AMBULATORY_CARE_PROVIDER_SITE_OTHER): Payer: No Typology Code available for payment source | Admitting: Family Medicine

## 2021-11-11 VITALS — BP 130/92 | HR 100 | Ht 63.0 in | Wt 188.0 lb

## 2021-11-11 DIAGNOSIS — M9902 Segmental and somatic dysfunction of thoracic region: Secondary | ICD-10-CM | POA: Diagnosis not present

## 2021-11-11 DIAGNOSIS — M9901 Segmental and somatic dysfunction of cervical region: Secondary | ICD-10-CM

## 2021-11-11 DIAGNOSIS — M999 Biomechanical lesion, unspecified: Secondary | ICD-10-CM

## 2021-11-11 DIAGNOSIS — M9908 Segmental and somatic dysfunction of rib cage: Secondary | ICD-10-CM

## 2021-11-11 DIAGNOSIS — M533 Sacrococcygeal disorders, not elsewhere classified: Secondary | ICD-10-CM

## 2021-11-11 DIAGNOSIS — M9903 Segmental and somatic dysfunction of lumbar region: Secondary | ICD-10-CM

## 2021-11-11 DIAGNOSIS — M9905 Segmental and somatic dysfunction of pelvic region: Secondary | ICD-10-CM

## 2021-11-11 DIAGNOSIS — M9904 Segmental and somatic dysfunction of sacral region: Secondary | ICD-10-CM

## 2021-11-11 LAB — CBC WITH DIFFERENTIAL/PLATELET
Basophils Absolute: 0.1 10*3/uL (ref 0.0–0.1)
Basophils Relative: 0.7 % (ref 0.0–3.0)
Eosinophils Absolute: 0.1 10*3/uL (ref 0.0–0.7)
Eosinophils Relative: 1.1 % (ref 0.0–5.0)
HCT: 38 % (ref 36.0–46.0)
Hemoglobin: 12.7 g/dL (ref 12.0–15.0)
Lymphocytes Relative: 21.3 % (ref 12.0–46.0)
Lymphs Abs: 2.4 10*3/uL (ref 0.7–4.0)
MCHC: 33.5 g/dL (ref 30.0–36.0)
MCV: 83.3 fl (ref 78.0–100.0)
Monocytes Absolute: 1 10*3/uL (ref 0.1–1.0)
Monocytes Relative: 8.7 % (ref 3.0–12.0)
Neutro Abs: 7.5 10*3/uL (ref 1.4–7.7)
Neutrophils Relative %: 68.2 % (ref 43.0–77.0)
Platelets: 505 10*3/uL — ABNORMAL HIGH (ref 150.0–400.0)
RBC: 4.56 Mil/uL (ref 3.87–5.11)
RDW: 13 % (ref 11.5–15.5)
WBC: 11 10*3/uL — ABNORMAL HIGH (ref 4.0–10.5)

## 2021-11-11 LAB — COMPREHENSIVE METABOLIC PANEL
ALT: 23 U/L (ref 0–35)
AST: 19 U/L (ref 0–37)
Albumin: 4 g/dL (ref 3.5–5.2)
Alkaline Phosphatase: 136 U/L — ABNORMAL HIGH (ref 39–117)
BUN: 10 mg/dL (ref 6–23)
CO2: 28 mEq/L (ref 19–32)
Calcium: 9.5 mg/dL (ref 8.4–10.5)
Chloride: 100 mEq/L (ref 96–112)
Creatinine, Ser: 0.77 mg/dL (ref 0.40–1.20)
GFR: 88 mL/min (ref >60.00)
Glucose, Bld: 93 mg/dL (ref 70–99)
Potassium: 3.9 mEq/L (ref 3.5–5.1)
Sodium: 137 mEq/L (ref 135–145)
Total Bilirubin: 0.3 mg/dL (ref 0.2–1.2)
Total Protein: 7.2 g/dL (ref 6.0–8.3)

## 2021-11-11 LAB — TSH: TSH: 2.96 u[IU]/mL (ref 0.35–5.50)

## 2021-11-11 LAB — LIPID PANEL
Cholesterol: 227 mg/dL — ABNORMAL HIGH (ref 0–200)
HDL: 77.1 mg/dL (ref >39.00)
NonHDL: 150.23
Total CHOL/HDL Ratio: 3
Triglycerides: 375 mg/dL — ABNORMAL HIGH (ref 0.0–149.0)
VLDL: 75 mg/dL — ABNORMAL HIGH (ref 0.0–40.0)

## 2021-11-11 LAB — VITAMIN D 25 HYDROXY (VIT D DEFICIENCY, FRACTURES): VITD: 24.52 ng/mL — ABNORMAL LOW (ref 30.00–100.00)

## 2021-11-11 LAB — HEMOGLOBIN A1C: Hgb A1c MFr Bld: 5.5 % (ref 4.6–6.5)

## 2021-11-11 LAB — LDL CHOLESTEROL, DIRECT: Direct LDL: 96 mg/dL

## 2021-11-11 NOTE — Assessment & Plan Note (Signed)
? ?  Decision today to treat with OMT was based on Physical Exam  After verbal consent patient was treated with HVLA, ME, FPR techniques in cervical, thoracic, rib, lumbar and sacral and pelvis  areas, all areas are chronic   Patient tolerated the procedure well with improvement in symptoms  Patient given exercises, stretches and lifestyle modifications  See medications in patient instructions if given  Patient will follow up in 4-8 weeks 

## 2021-11-11 NOTE — Patient Instructions (Signed)
See me in 6-8 weeks to discuss medications ?

## 2021-11-11 NOTE — Assessment & Plan Note (Signed)
No difficulty with this previously. ? ?Reviewed some of the underlying problem.  Does also have the L4 nerve root impingement.  Discussed which activities to do and which ones to avoid, increase activity slowly.  Follow-up with me again in 6 to 8 weeks.  Patient will be traveling to [place] in the near future and will likely give patient meloxicam, prednisone, and potentially a muscle relaxer to have on the travel. ?

## 2021-11-16 ENCOUNTER — Other Ambulatory Visit: Payer: Self-pay | Admitting: Family Medicine

## 2021-11-20 ENCOUNTER — Other Ambulatory Visit: Payer: Self-pay | Admitting: Internal Medicine

## 2021-12-22 NOTE — Progress Notes (Signed)
?Charlann Boxer D.O. ?[facility] ?[place] ?Phone: [phone] ?Subjective:   ?I, Jacqualin Combes, am serving as a scribe for Dr. Hulan Saas. ? ?This visit occurred during the SARS-CoV-2 public health emergency.  Safety protocols were in place, including screening questions prior to the visit, additional usage of staff PPE, and extensive cleaning of exam room while observing appropriate contact time as indicated for disinfecting solutions.  ? ? ?I'm seeing this patient by the request  of:  Binnie Rail, MD ? ?CC: Hip pain follow-up ? ?[id number]  ?Jessica [name] is a 56 y.o. female coming in with complaint of back and neck pain. OMT on 11/11/2021. Patient states that she has not had any changes since last visit. Pain intermittent in L lumbar spine. Discontinued meloxicam but does use gabapentin.  ? ?Medications patient has been prescribed: Mobic gabapentin ? ?Taking: ? ? ?  ? ? ? ? ?Reviewed prior external information including notes and imaging from previsou exam, outside providers and external EMR if available.  ? ?As well as notes that were available from care everywhere and other healthcare systems. ? ?Past medical history, social, surgical and family history all reviewed in electronic medical record.  No pertanent information unless stated regarding to the chief complaint.  ? ?Past Medical History:  ?Diagnosis Date  ? ALLERGIC RHINITIS CAUSE UNSPECIFIED   ? ANXIETY DISORDER   ? Complication of anesthesia   ? CYSTIC ACNE   ? DEPRESSION   ? DYSPLASTIC NEVUS, CHEST   ? ELEVATED BP READING WITHOUT DX HYPERTENSION   ? GERD (gastroesophageal reflux disease)   ? INSOMNIA   ? Mixed hyperlipidemia   ? PONV (postoperative nausea and vomiting)   ? THROMBOCYTOSIS   ?  ?No Known Allergies ? ? ?Review of Systems: ? No headache, visual changes, nausea, vomiting, diarrhea, constipation, dizziness, abdominal pain, skin rash, fevers, chills, night sweats, weight loss, swollen  lymph nodes, body aches, joint swelling, chest pain, shortness of breath, mood changes. POSITIVE muscle aches ? ?Objective  ?Blood pressure 128/90, pulse (!) 102, height '5\' 3"'$  (1.6 m), weight 193 lb (87.5 kg), last menstrual period 12/15/2011, SpO2 97 %. ?  ?General: No apparent distress alert and oriented x3 mood and affect normal, dressed appropriately.  ?HEENT: Pupils equal, extraocular movements intact  ?Respiratory: Patient's speak in full sentences and does not appear short of breath  ?Cardiovascular: No lower extremity edema, non tender, no erythema  ?Low back exam does have some mild loss of lordosis.  Some tenderness to palpation in the paraspinal musculature.  Tightness with FABER test right greater than left. ? ?Osteopathic findings ? ?C2 flexed rotated and side bent right ?C6 flexed rotated and side bent left ? ?T9 extended rotated and side bent left ?L2 flexed rotated and side bent right ?Sacrum right on right ?Pelvic shear noted ? ? ? ?  ?Assessment and Plan: ? ?Chronic low back pain ?Chronic problem but overall stable.  Patient is responding well to manipulation.  Patient will be traveling for long time and getting prednisone for the trip in case she does have more difficulty.  Patient does have meloxicam and we discussed that versus Advil but not taking both of them at the same time and follow-up with me again in 6 to 8 weeks ?  ? ?Nonallopathic problems ? ?Decision today to treat with OMT was based on Physical Exam ? ?After verbal consent patient was treated with HVLA, ME, FPR techniques in cervical,  pelvis, thoracic, lumbar, and sacral  areas ? ?Patient tolerated the procedure well with improvement in symptoms ? ?Patient given exercises, stretches and lifestyle modifications ? ?See medications in patient instructions if given ? ?Patient will follow up in 4-8 weeks ? ?  ? ?The above documentation has been reviewed and is accurate and complete Lyndal Pulley, DO  ? ? ?  ? ? Note: This dictation was  prepared with Dragon dictation along with smaller phrase technology. Any transcriptional errors that result from this process are unintentional.    ?  ?  ? ?

## 2021-12-23 ENCOUNTER — Ambulatory Visit: Payer: No Typology Code available for payment source | Admitting: Family Medicine

## 2021-12-23 ENCOUNTER — Encounter: Payer: Self-pay | Admitting: Family Medicine

## 2021-12-23 VITALS — BP 128/90 | HR 102 | Ht 63.0 in | Wt 193.0 lb

## 2021-12-23 DIAGNOSIS — M9905 Segmental and somatic dysfunction of pelvic region: Secondary | ICD-10-CM

## 2021-12-23 DIAGNOSIS — M5442 Lumbago with sciatica, left side: Secondary | ICD-10-CM

## 2021-12-23 DIAGNOSIS — G8929 Other chronic pain: Secondary | ICD-10-CM

## 2021-12-23 DIAGNOSIS — M9903 Segmental and somatic dysfunction of lumbar region: Secondary | ICD-10-CM | POA: Diagnosis not present

## 2021-12-23 DIAGNOSIS — M9901 Segmental and somatic dysfunction of cervical region: Secondary | ICD-10-CM | POA: Diagnosis not present

## 2021-12-23 DIAGNOSIS — M9904 Segmental and somatic dysfunction of sacral region: Secondary | ICD-10-CM | POA: Diagnosis not present

## 2021-12-23 DIAGNOSIS — M9902 Segmental and somatic dysfunction of thoracic region: Secondary | ICD-10-CM

## 2021-12-23 MED ORDER — PREDNISONE 50 MG PO TABS: ORAL_TABLET | ORAL | 0 refills | Status: DC

## 2021-12-23 NOTE — Patient Instructions (Signed)
Prednisone '50mg'$  for 5 days ?IBU 3 pills 3x a day for 3 days or meloxicam ?Have a great trip ?See you in August ?

## 2021-12-23 NOTE — Assessment & Plan Note (Signed)
Chronic problem but overall stable.  Patient is responding well to manipulation.  Patient will be traveling for long time and getting prednisone for the trip in case she does have more difficulty.  Patient does have meloxicam and we discussed that versus Advil but not taking both of them at the same time and follow-up with me again in 6 to 8 weeks ?

## 2022-01-07 ENCOUNTER — Other Ambulatory Visit: Payer: Self-pay | Admitting: Family Medicine

## 2022-01-07 MED ORDER — CYCLOBENZAPRINE HCL 5 MG PO TABS: 5.0000 mg | ORAL_TABLET | Freq: Three times a day (TID) | ORAL | 0 refills | Status: AC | PRN

## 2022-01-11 ENCOUNTER — Other Ambulatory Visit: Payer: Self-pay | Admitting: Family Medicine

## 2022-01-12 MED ORDER — GABAPENTIN 100 MG PO CAPS: 200.0000 mg | ORAL_CAPSULE | Freq: Three times a day (TID) | ORAL | 0 refills | Status: DC

## 2022-03-15 ENCOUNTER — Encounter: Payer: Self-pay | Admitting: Family Medicine

## 2022-03-16 ENCOUNTER — Other Ambulatory Visit: Payer: Self-pay

## 2022-03-30 ENCOUNTER — Other Ambulatory Visit: Payer: Self-pay

## 2022-03-30 MED ORDER — GABAPENTIN 100 MG PO CAPS: 200.0000 mg | ORAL_CAPSULE | Freq: Three times a day (TID) | ORAL | 0 refills | Status: DC

## 2022-04-22 ENCOUNTER — Other Ambulatory Visit: Payer: Self-pay | Admitting: Family Medicine

## 2022-04-27 ENCOUNTER — Other Ambulatory Visit: Payer: Self-pay

## 2022-04-27 MED ORDER — GABAPENTIN 100 MG PO CAPS: 200.0000 mg | ORAL_CAPSULE | Freq: Three times a day (TID) | ORAL | 0 refills | Status: DC

## 2022-05-03 NOTE — Progress Notes (Unsigned)
Virtual Visit via Video Note  I connected with Jessica [name] on 05/03/22 at 10:20 AM EDT by a video enabled telemedicine application and verified that I am speaking with the correct person using two identifiers.   I discussed the limitations of evaluation and management by telemedicine and the availability of in person appointments. The patient expressed understanding and agreed to proceed.  Present for the visit:  Myself, Dr Billey Gosling, Hollace Hayward.  The patient is currently at home and I am in the office.    No referring provider.    History of Present Illness: She is here for an acute visit for ? IBS symptoms.   Over the past week - slowly getting better - last Tuesday night - stomach issues that would not quit.  She felt nausea and could not vomit or go to the bathroom.  Would feel cramping in abdomen, she was also dizzy.  She would get a little stool out and the cramping and dizziness would improve.   Wednesday, which was the next day she had diarrhea.  She is still is not right.  Spent a couple of days not eating and wonders if that is part of the reason she does not feel good.  She is better.  She has more but eating very bland and low fiber.    She has had some increased stress recently.  She wonders if this is IBS or if there is anything else more serious going on.  She has had IBS in the past, but it has been years.  She was supposed to go to [place] Saturday to see her dad.    She was unsure if she should add probiotics or continue doing what she is doing.  She is slowly starting to increase the food she is eating.  Review of Systems  Constitutional:  Positive for diaphoresis (when nauseous and had diarrhea). Negative for fever.  Gastrointestinal:  Positive for abdominal pain, diarrhea and nausea. Negative for blood in stool and vomiting.  Neurological:  Positive for dizziness.      Social History   Socioeconomic History   Marital status: Married    Spouse  name: paul   Number of children: Not on file   Years of education: Not on file   Highest education level: Not on file  Occupational History   Not on file  Tobacco Use   Smoking status: Never   Smokeless tobacco: Never  Vaping Use   Vaping Use: Never used  Substance and Sexual Activity   Alcohol use: No   Drug use: No   Sexual activity: Not on file  Other Topics Concern   Not on file  Social History Narrative   Married lives with spouse and two kids. She is originally from [place] to [place]   Social Determinants of Health   Financial Resource Strain: Not on file  Food Insecurity: Not on file  Transportation Needs: Not on file  Physical Activity: Not on file  Stress: Not on file  Social Connections: Not on file     Observations/Objective: Appears well in NAD Breathing normally Skin appears warm and dry  Assessment and Plan:  See Problem List for Assessment and Plan of chronic medical problems.   Follow Up Instructions:    I discussed the assessment and treatment plan with the patient. The patient was provided an opportunity to ask questions and all were answered. The patient agreed with the plan and demonstrated an understanding of the  instructions.   The patient was advised to call back or seek an in-person evaluation if the symptoms worsen or if the condition fails to improve as anticipated.    Binnie Rail, MD

## 2022-05-04 ENCOUNTER — Encounter: Payer: Self-pay | Admitting: Internal Medicine

## 2022-05-04 ENCOUNTER — Telehealth (INDEPENDENT_AMBULATORY_CARE_PROVIDER_SITE_OTHER): Payer: No Typology Code available for payment source | Admitting: Internal Medicine

## 2022-05-04 DIAGNOSIS — K589 Irritable bowel syndrome without diarrhea: Secondary | ICD-10-CM

## 2022-05-04 NOTE — Assessment & Plan Note (Signed)
Acute Recent episode could be an IBS flare, but may have also been viral in nature or combination of both Stress level was a little bit elevated, but nothing too out of the ordinary No one else was sick so was not food poisoning Symptoms are improving Advised her to continue to increase/advance her diet slowly Can consider probiotics No concerning symptoms to suggest cancer or need for further evaluation She will let me know if she does not continue to see improvement or if there is any concerns

## 2022-05-07 ENCOUNTER — Other Ambulatory Visit: Payer: Self-pay | Admitting: Internal Medicine

## 2022-06-09 ENCOUNTER — Other Ambulatory Visit: Payer: Self-pay | Admitting: Internal Medicine

## 2022-07-27 ENCOUNTER — Other Ambulatory Visit: Payer: Self-pay | Admitting: Family Medicine

## 2022-08-31 ENCOUNTER — Encounter: Payer: Self-pay | Admitting: Internal Medicine

## 2022-09-01 ENCOUNTER — Ambulatory Visit (INDEPENDENT_AMBULATORY_CARE_PROVIDER_SITE_OTHER): Payer: No Typology Code available for payment source | Admitting: Emergency Medicine

## 2022-09-01 ENCOUNTER — Encounter: Payer: Self-pay | Admitting: Emergency Medicine

## 2022-09-01 VITALS — BP 132/78 | HR 95 | Temp 98.6°F | Ht 63.0 in | Wt 193.5 lb

## 2022-09-01 DIAGNOSIS — B309 Viral conjunctivitis, unspecified: Secondary | ICD-10-CM | POA: Diagnosis not present

## 2022-09-01 NOTE — Progress Notes (Signed)
Jessica [name] 57 y.o.   Chief Complaint  Patient presents with   Acute Visit    Poss pink eye left eye, itchy, watery    HISTORY OF PRESENT ILLNESS: This is a 57 y.o. female complaining of possible pinkeye Left eye has been watery and itchy.  Better today No purulent discharge Denies blurry vision or eye pain.  HPI   Prior to Admission medications   Medication Sig Start Date End Date Taking? Authorizing Provider  augmented betamethasone dipropionate (DIPROLENE-AF) 0.05 % cream Apply topically 2 (two) times daily as needed. 02/15/18  Yes Binnie Rail, MD  betamethasone dipropionate 0.05 % cream  05/25/19  Yes [provider]  buPROPion (WELLBUTRIN XL) 300 MG 24 hr tablet Take 300 mg daily by mouth. 05/05/17  Yes [provider]  cetirizine (ZYRTEC) 10 MG tablet Take 10 mg by mouth as needed.   Yes [provider]  cyclobenzaprine (FLEXERIL) 5 MG tablet Take 1 tablet (5 mg total) by mouth 3 (three) times daily as needed for muscle spasms. 01/07/22  Yes Lyndal Pulley, DO  drospirenone-ethinyl estradiol (YAZ) 3-0.02 MG per tablet Take 1 tablet by mouth daily.   Yes [provider]  fexofenadine (ALLEGRA) 180 MG tablet Take 180 mg by mouth as needed.   Yes [provider]  gabapentin (NEURONTIN) 100 MG capsule TAKE 2 CAPSULES BY MOUTH 3 TIMES DAILY 07/27/22  Yes Hulan Saas M, DO  KLONOPIN 0.5 MG tablet Take 1 tablet by mouth twice a day as needed TKE 1/2-1 AS NEEDED 04/17/19  Yes [provider]  Melatonin 3 MG TABS Take by mouth at bedtime as needed.   Yes [provider]  meloxicam (MOBIC) 15 MG tablet TAKE 1 TABLET (15 MG TOTAL) BY MOUTH DAILY. 11/10/21  Yes Hulan Saas M, DO  montelukast (SINGULAIR) 10 MG tablet TAKE 1 TABLET BY MOUTH AT  BEDTIME 11/20/21  Yes Burns, Claudina Lick, MD  olopatadine (PATANOL) 0.1 % ophthalmic solution INSTILL 1 DROP INTO BOTH  EYES TWICE DAILY. 05/29/19  Yes Burns, Claudina Lick, MD   omeprazole (PRILOSEC) 20 MG capsule TAKE 1 CAPSULE BY MOUTH EVERY  OTHER DAY 05/10/22  Yes Burns, Claudina Lick, MD  Phenylephrine HCl 5 MG TABS Take by mouth as needed.   Yes [provider]  pseudoephedrine (SUDAFED) 120 MG 12 hr tablet Take 1 tablet (120 mg total) by mouth 2 (two) times daily as needed for congestion. 01/02/20  Yes Burns, Claudina Lick, MD  QUEtiapine (SEROQUEL) 25 MG tablet Take 25 mg by mouth at bedtime. 08/19/22  Yes [provider]  simvastatin (ZOCOR) 20 MG tablet TAKE 1 TABLET BY MOUTH AT  BEDTIME 06/10/22  Yes Burns, Claudina Lick, MD  vortioxetine HBr (TRINTELLIX) 20 MG TABS tablet Trintellix 20 mg tablet  TK 1 T PO D   Yes [provider]    No Known Allergies  Patient Active Problem List   Diagnosis Date Noted   Vitamin D deficiency 11/09/2021   COVID 12/26/2020   Shingles 05/05/2020   Loss of transverse plantar arch 08/28/2019   Eczema 06/05/2018   Hyperglycemia 06/05/2018   Obesity with body mass index greater than 30 06/02/2017   GERD (gastroesophageal reflux disease) 09/02/2015   IBS (irritable bowel syndrome) 05/01/2014   Nonallopathic lesion of thoracic region 12/24/2013   Greater trochanteric bursitis of left hip 04/30/2013   Chronic low back pain 04/23/2013   Sacroiliac joint dysfunction of left side 04/19/2013   Greater  trochanteric bursitis of right hip 04/11/2013   Hip flexor tightness 04/11/2013   Nonallopathic lesion of lumbosacral region 04/11/2013   Nonallopathic lesion of pelvic region 04/11/2013   Gait abnormality 05/09/2012   Depression 04/24/2010   Allergic rhinitis 12/06/2008   DYSPLASTIC NEVUS, CHEST 04/22/2008   Mixed hyperlipidemia 04/22/2008   INSOMNIA 04/22/2008   Anxiety state 10/04/2007    Past Medical History:  Diagnosis Date   ALLERGIC RHINITIS CAUSE UNSPECIFIED    ANXIETY DISORDER    Complication of anesthesia    CYSTIC ACNE    DEPRESSION    DYSPLASTIC NEVUS, CHEST    ELEVATED BP READING WITHOUT DX  HYPERTENSION    GERD (gastroesophageal reflux disease)    INSOMNIA    Mixed hyperlipidemia    PONV (postoperative nausea and vomiting)    THROMBOCYTOSIS     Past Surgical History:  Procedure Laterality Date   BREAST SURGERY  2000   Breast reduction   CHOLECYSTECTOMY  05/04/2018   CHOLECYSTECTOMY N/A 05/04/2018   Procedure: LAPAROSCOPIC CHOLECYSTECTOMY;  Surgeon: Georganna Skeans, MD;  Location: [facility];  Service: General;  Laterality: N/A;   Deviated septum repair  1997    Social History   Socioeconomic History   Marital status: Married    Spouse name: paul   Number of children: Not on file   Years of education: Not on file   Highest education level: Not on file  Occupational History   Not on file  Tobacco Use   Smoking status: Never   Smokeless tobacco: Never  Vaping Use   Vaping Use: Never used  Substance and Sexual Activity   Alcohol use: No   Drug use: No   Sexual activity: Not on file  Other Topics Concern   Not on file  Social History Narrative   Married lives with spouse and two kids. She is originally from [place] to [place]   Social Determinants of Health   Financial Resource Strain: Not on file  Food Insecurity: Not on file  Transportation Needs: Not on file  Physical Activity: Not on file  Stress: Not on file  Social Connections: Not on file  Intimate Partner Violence: Not on file    Family History  Problem Relation Age of Onset   Hyperlipidemia Father    Hypertension Father    Depression Sister    Coronary artery disease Paternal Grandfather      Review of Systems  Constitutional: Negative.  Negative for chills and fever.  HENT: Negative.  Negative for congestion and sore throat.   Eyes:  Positive for redness. Negative for blurred vision, double vision, photophobia, pain and discharge.  Respiratory: Negative.  Negative for cough and shortness of breath.   Cardiovascular: Negative.  Negative for chest pain and palpitations.   Gastrointestinal:  Negative for abdominal pain, nausea and vomiting.  Genitourinary: Negative.   Skin: Negative.  Negative for rash.  Neurological: Negative.  Negative for dizziness and headaches.  All other systems reviewed and are negative.  Today's Vitals   09/01/22 1450  BP: 132/78  Pulse: 95  Temp: 98.6 F (37 C)  TempSrc: Oral  SpO2: 98%  Weight: 193 lb 8 oz (87.8 kg)  Height: '5\' 3"'$  (1.6 m)   Body mass index is 34.28 kg/m.   Physical Exam Vitals reviewed.  Constitutional:      Appearance: Normal appearance.  Eyes:     Extraocular Movements: Extraocular movements intact.     Conjunctiva/sclera: Conjunctivae normal.     Pupils:  Pupils are equal, round, and reactive to light.  Cardiovascular:     Rate and Rhythm: Normal rate.  Pulmonary:     Effort: Pulmonary effort is normal.  Skin:    General: Skin is warm and dry.  Neurological:     General: No focal deficit present.     Mental Status: She is alert and oriented to person, place, and time.  Psychiatric:        Mood and Affect: Mood normal.        Behavior: Behavior normal.      ASSESSMENT & PLAN: A total of 32 minutes was spent with the patient and counseling/coordination of care regarding preparing for this visit, review of most recent office visit notes, review of past medical history and chronic medical problems, review of all medications, differential diagnosis of conjunctivitis, prognosis, documentation, and need for follow-up if no better or worse during the next several days.  Problem List Items Addressed This Visit       Other   Acute viral conjunctivitis of left eye - Primary    Clinically stable and running its course. Much improved today than when it started couple days ago. Minimal findings on exam. No visual problems or headaches. No concerns today. No specific treatment needed at this time. Advised to contact the office if symptoms return or gets worse over the next several days.       Patient Instructions  Viral Conjunctivitis, Adult  Viral conjunctivitis is an inflammation of the conjunctiva. The conjunctiva is the clear membrane that covers the white part of the eye and the inner surface of the eyelid. The inflammation is caused by a viral infection. The blood vessels in the conjunctiva become large, causing the eye to become red or pink and often itchy and tearing. The inflammation usually starts in one eye and goes to the other in a day or two. Infections usually go away over 1-2 weeks. Viral conjunctivitis is contagious. This means it can be easily passed from one person to another. This condition is often called pink eye. What are the causes? This condition is caused by a virus. It can be spread by touching objects that have been contaminated with the virus, such as doorknobs or towels, and then touching your eye. It can also be passed through tiny droplets, such as from coughing or sneezing. What increases the risk? You are more likely to develop this condition if you have a cold or the flu, or are in close contact with a person who has pink eye. What are the signs or symptoms? Symptoms of this condition include: Redness in the eye. Tearing or watery eyes. Itchy and irritated eyes. Burning feeling in the eyes. Clear drainage from the eye. Swollen eyelids. A gritty feeling in the eye. Light sensitivity. This condition often occurs with other symptoms, such as nasal congestion, cough, and fever. How is this diagnosed? This condition is diagnosed with a medical history and physical exam. If you have discharge from your eye, the discharge may be tested for a virus or to rule out other causes of conjunctivitis. How is this treated? Viral conjunctivitis does not respond to medicines that kill bacteria (antibiotics). The condition most often goes away on its own in 1-2 weeks. If treatment is needed, it is aimed at relieving your symptoms and preventing the spread of  infection. This may be done with artificial tear drops, antihistamine drops, or other eye medicines. In rare cases, steroid eye drops or anti-herpes virus medicines  may be prescribed. Follow these instructions at home: Medicines  Take or apply over-the-counter and prescription medicines only as told by your health care provider. Do not touch the edge of the eyelid with the eye-drop bottle or ointment tube when applying medicines to the affected eye. This will prevent the spread of the infection to the other eye or to other people. Eye care Avoid touching or rubbing your eyes. Apply a clean, cool, wet washcloth onto your eye for 10-20 minutes, 3-4 times per day, or as told by your health care provider. If you wear contact lenses, do notwear them until the inflammation is gone and your health care provider says it is safe to wear them again. Ask your health care provider how to disinfect or replace your contact lenses before using them again. Wear glasses until you can resume wearing contacts. Avoid wearing eye makeup until the inflammation is gone. Throw away any old eye cosmetics that may be contaminated. Gently wipe away any crusting from your eye with a wet washcloth or a cotton ball. General instructions Change or wash your pillowcase every day or as told by your health care provider. Do not share towels, pillowcases, washcloths, eye makeup, makeup brushes, eye drops, contact lenses, or eyeglasses. This may spread the infection. Wash your hands often with soap and water. Use paper towels to dry your hands. If soap and water are not available, use hand sanitizer. Avoid contact with other people until your eye is no longer red and tearing, or as told by your health care provider. Keep all follow-up visits. Contact a health care provider if: Your symptoms do not improve with treatment, or they get worse. You have increased pain. Your vision becomes blurry. You have a fever. You have facial  pain, redness, or swelling. You have yellow or green drainage coming from your eye. You have new symptoms. Get help right away if: You develop severe pain. Your vision gets much worse. Summary Viral conjunctivitis is an inflammation of the conjunctiva. It usually goes away in 1-2 weeks. The condition is caused by a virus and is spread by touching contaminated objects or breathing in droplets from a cough or a sneeze. This condition is usually treated with medicines and cold compresses to relieve the symptoms. Because it is caused by a virus, it should not be treated with antibiotics. This condition is very contagious. To prevent infection, avoid close contact with others, wash your hands often, and do not share towels or washcloths. Contact a health care provider if your symptoms do not go away with treatment, or if you have blurry vision, facial swelling, or increased pain. This information is not intended to replace advice given to you by your health care provider. Make sure you discuss any questions you have with your health care provider. Document Revised: 09/02/2021 Document Reviewed: 09/02/2021 Elsevier Patient Education  2023 [place], MD [facility]

## 2022-09-01 NOTE — Assessment & Plan Note (Signed)
Clinically stable and running its course. Much improved today than when it started couple days ago. Minimal findings on exam. No visual problems or headaches. No concerns today. No specific treatment needed at this time. Advised to contact the office if symptoms return or gets worse over the next several days.

## 2022-09-01 NOTE — Patient Instructions (Signed)
Viral Conjunctivitis, Adult  Viral conjunctivitis is an inflammation of the conjunctiva. The conjunctiva is the clear membrane that covers the white part of the eye and the inner surface of the eyelid. The inflammation is caused by a viral infection. The blood vessels in the conjunctiva become large, causing the eye to become red or pink and often itchy and tearing. The inflammation usually starts in one eye and goes to the other in a day or two. Infections usually go away over 1-2 weeks. Viral conjunctivitis is contagious. This means it can be easily passed from one person to another. This condition is often called pink eye. What are the causes? This condition is caused by a virus. It can be spread by touching objects that have been contaminated with the virus, such as doorknobs or towels, and then touching your eye. It can also be passed through tiny droplets, such as from coughing or sneezing. What increases the risk? You are more likely to develop this condition if you have a cold or the flu, or are in close contact with a person who has pink eye. What are the signs or symptoms? Symptoms of this condition include: Redness in the eye. Tearing or watery eyes. Itchy and irritated eyes. Burning feeling in the eyes. Clear drainage from the eye. Swollen eyelids. A gritty feeling in the eye. Light sensitivity. This condition often occurs with other symptoms, such as nasal congestion, cough, and fever. How is this diagnosed? This condition is diagnosed with a medical history and physical exam. If you have discharge from your eye, the discharge may be tested for a virus or to rule out other causes of conjunctivitis. How is this treated? Viral conjunctivitis does not respond to medicines that kill bacteria (antibiotics). The condition most often goes away on its own in 1-2 weeks. If treatment is needed, it is aimed at relieving your symptoms and preventing the spread of infection. This may be done  with artificial tear drops, antihistamine drops, or other eye medicines. In rare cases, steroid eye drops or anti-herpes virus medicines may be prescribed. Follow these instructions at home: Medicines  Take or apply over-the-counter and prescription medicines only as told by your health care provider. Do not touch the edge of the eyelid with the eye-drop bottle or ointment tube when applying medicines to the affected eye. This will prevent the spread of the infection to the other eye or to other people. Eye care Avoid touching or rubbing your eyes. Apply a clean, cool, wet washcloth onto your eye for 10-20 minutes, 3-4 times per day, or as told by your health care provider. If you wear contact lenses, do notwear them until the inflammation is gone and your health care provider says it is safe to wear them again. Ask your health care provider how to disinfect or replace your contact lenses before using them again. Wear glasses until you can resume wearing contacts. Avoid wearing eye makeup until the inflammation is gone. Throw away any old eye cosmetics that may be contaminated. Gently wipe away any crusting from your eye with a wet washcloth or a cotton ball. General instructions Change or wash your pillowcase every day or as told by your health care provider. Do not share towels, pillowcases, washcloths, eye makeup, makeup brushes, eye drops, contact lenses, or eyeglasses. This may spread the infection. Wash your hands often with soap and water. Use paper towels to dry your hands. If soap and water are not available, use hand sanitizer. Avoid contact  with other people until your eye is no longer red and tearing, or as told by your health care provider. Keep all follow-up visits. Contact a health care provider if: Your symptoms do not improve with treatment, or they get worse. You have increased pain. Your vision becomes blurry. You have a fever. You have facial pain, redness, or  swelling. You have yellow or green drainage coming from your eye. You have new symptoms. Get help right away if: You develop severe pain. Your vision gets much worse. Summary Viral conjunctivitis is an inflammation of the conjunctiva. It usually goes away in 1-2 weeks. The condition is caused by a virus and is spread by touching contaminated objects or breathing in droplets from a cough or a sneeze. This condition is usually treated with medicines and cold compresses to relieve the symptoms. Because it is caused by a virus, it should not be treated with antibiotics. This condition is very contagious. To prevent infection, avoid close contact with others, wash your hands often, and do not share towels or washcloths. Contact a health care provider if your symptoms do not go away with treatment, or if you have blurry vision, facial swelling, or increased pain. This information is not intended to replace advice given to you by your health care provider. Make sure you discuss any questions you have with your health care provider. Document Revised: 09/02/2021 Document Reviewed: 09/02/2021 Elsevier Patient Education  Jessica [name].

## 2022-09-18 ENCOUNTER — Other Ambulatory Visit: Payer: Self-pay | Admitting: Internal Medicine

## 2022-09-20 ENCOUNTER — Encounter: Payer: Self-pay | Admitting: Internal Medicine

## 2022-09-20 MED ORDER — MONTELUKAST SODIUM 10 MG PO TABS: 10.0000 mg | ORAL_TABLET | Freq: Every day | ORAL | 0 refills | Status: DC

## 2022-11-09 ENCOUNTER — Telehealth: Payer: Self-pay | Admitting: Internal Medicine

## 2022-11-09 ENCOUNTER — Encounter: Payer: Self-pay | Admitting: Family Medicine

## 2022-11-09 ENCOUNTER — Ambulatory Visit: Payer: No Typology Code available for payment source | Admitting: Family Medicine

## 2022-11-09 VITALS — BP 130/98 | HR 97 | Ht 63.0 in | Wt 197.2 lb

## 2022-11-09 DIAGNOSIS — G8929 Other chronic pain: Secondary | ICD-10-CM

## 2022-11-09 DIAGNOSIS — E782 Mixed hyperlipidemia: Secondary | ICD-10-CM

## 2022-11-09 DIAGNOSIS — M5442 Lumbago with sciatica, left side: Secondary | ICD-10-CM

## 2022-11-09 DIAGNOSIS — E559 Vitamin D deficiency, unspecified: Secondary | ICD-10-CM

## 2022-11-09 DIAGNOSIS — R739 Hyperglycemia, unspecified: Secondary | ICD-10-CM

## 2022-11-09 MED ORDER — PREDNISONE 50 MG PO TABS: 50.0000 mg | ORAL_TABLET | Freq: Every day | ORAL | 0 refills | Status: DC

## 2022-11-09 NOTE — Telephone Encounter (Signed)
Pt insurance is [employer]../[employer]mb

## 2022-11-09 NOTE — Telephone Encounter (Signed)
Blood work ordered.

## 2022-11-09 NOTE — Progress Notes (Unsigned)
Jessica [name] is a 57 y.o. female who presents to [facility] at [facility] today for left hip pain. Hx of greater trochanteric bursitis and pinched nerve in the lower back. Sx flaring up over the past month after lifting rolling back on to conveyer belt at the airport. Pain at posterior aspect of the hip. Intermittent numbness at anterior thigh. Denies groin pain. Denies mechanical sx in the hip. Has been taking Gabapentin but not Meloxicam.  She is leaving on Friday to fly to [place] to help her father who is suffering from dementia.  She will be gone for the weekend.  Pertinent review of systems: No fevers or chills  Relevant historical information: History of lumbar radiculopathy at L2 left side in the past.  She has had epidural steroid injections which have helped.   Exam:  BP (!) 130/98   Pulse 97   Ht 5\' 3"  (1.6 m)   Wt 197 lb 3.2 oz (89.4 kg)   LMP 12/15/2011   SpO2 97%   BMI 34.93 kg/m  General: Well Developed, well nourished, and in no acute distress.   MSK: L-spine nontender palpation spinal midline normal lumbar motion.  Lower extremity strength is intact.  Reflexes are intact.    Lab and Radiology Results  EXAM: MRI LUMBAR SPINE WITHOUT CONTRAST   TECHNIQUE: Multiplanar, multisequence MR imaging of the lumbar spine was performed. No intravenous contrast was administered.   COMPARISON:  Radiography 04/23/2021   FINDINGS: Segmentation:  5 lumbar type vertebrae   Alignment:  Physiologic.   Vertebrae: Benign heterogeneity of marrow. No focal lesion, fracture, or discitis.   Conus medullaris and cauda equina: Conus extends to the L2 level. Conus and cauda equina appear normal.   Paraspinal and other soft tissues: Negative.   Disc levels:   Generally preserved disc height and hydration for age. There is evidence of a small left far-lateral extrusion at the level L2-3, as marked on sagittal and axial images, contacting the  left L2 nerve root. Mild annulus bulging especially at L3-4.   IMPRESSION: 1. Suspect tiny far-lateral disc extrusion at L2-3 which contacts the left L2 nerve root. 2. Otherwise unremarkable study.     Electronically Signed   By: Jorje Guild M.D.   On: 04/26/2021 09:44   I, Lynne Leader, personally (independently) visualized and performed the interpretation of the images attached in this note.      Assessment and Plan: 57 y.o. female with acute exacerbation of chronic left low back pain with an acute exacerbation of the chronic left L2 lumbar radiculopathy.  This likely will improve.  However her symptoms are quite bothersome right now and she has travel coming up at the end of the week.  She is flying to [place] to help her dad who suffers from dementia. Plan to maximize conservative management strategies of prednisone now.  She already has gabapentin and cyclobenzaprine which she will maximize as well.  Also recommend heating pad.  I have ordered an epidural steroid injection with the anticipation of her getting that done when she returns back to [place] next week.  We can also add physical therapy if we need to.  She will let me know.  We talked about opiates for pain control.  She will let me know if she needs it before Friday.   PDMP reviewed during this encounter. Orders Placed This Encounter  Procedures   DG INJECT DIAG/THERA/INC NEEDLE/CATH/PLC EPI/LUMB/SAC W/IMG    Standing Status:  Future    Standing Expiration Date:   11/09/2023    Order Specific Question:   Reason for Exam (SYMPTOM  OR DIAGNOSIS REQUIRED)    Answer:   left L2 symptoms. ESI or NRB or both. Level and technique per radiology    Order Specific Question:   Is the patient pregnant?    Answer:   No    Order Specific Question:   Preferred Imaging Location?    Answer:   [id number]    Order Specific Question:   Radiology Contrast Protocol - do NOT remove file path    Answer:    [email or web address]   Meds ordered this encounter  Medications   predniSONE (DELTASONE) 50 MG tablet    Sig: Take 1 tablet (50 mg total) by mouth daily.    Dispense:  5 tablet    Refill:  0     Discussed warning signs or symptoms. Please see discharge instructions. Patient expresses understanding.   The above documentation has been reviewed and is accurate and complete Lynne Leader, M.D.

## 2022-11-09 NOTE — Patient Instructions (Signed)
Thank you for coming in today.   Try a heating pad.   Take the prednisone.   Let me know if you need anything.   Call or go to the ER if you develop a large red swollen joint with extreme pain or oozing puss.

## 2022-11-09 NOTE — Telephone Encounter (Signed)
Patient would like to do her labs before her physical on April 12th, she asked if orders could be put in so she can come in and do them before her appointment.

## 2022-11-10 NOTE — Telephone Encounter (Signed)
Message sent to patient to let me know when she wanted to come by for labs.

## 2022-11-15 ENCOUNTER — Encounter: Payer: No Typology Code available for payment source | Admitting: Internal Medicine

## 2022-11-18 ENCOUNTER — Encounter: Payer: Self-pay | Admitting: Internal Medicine

## 2022-11-18 NOTE — Progress Notes (Addendum)
Subjective:    Patient ID: Jessica [name], female    DOB: [birth year], 57 y.o.   MRN: [id number]      HPI Jessica [name] is here for a Physical exam and her chronic medical problems.   Lots of stress, anxiety - dad has lewy body dementia and lives in [place].     Medications and allergies reviewed with patient and updated if appropriate.  Current Outpatient Medications on File Prior to Visit  Medication Sig Dispense Refill   augmented betamethasone dipropionate (DIPROLENE-AF) 0.05 % cream Apply topically 2 (two) times daily as needed. 150 g 1   betamethasone dipropionate 0.05 % cream      buPROPion (WELLBUTRIN XL) 300 MG 24 hr tablet Take 300 mg daily by mouth.  3   cetirizine (ZYRTEC) 10 MG tablet Take 10 mg by mouth as needed.     cyclobenzaprine (FLEXERIL) 5 MG tablet Take 1 tablet (5 mg total) by mouth 3 (three) times daily as needed for muscle spasms. 30 tablet 0   drospirenone-ethinyl estradiol (YAZ) 3-0.02 MG per tablet Take 1 tablet by mouth daily.     fexofenadine (ALLEGRA) 180 MG tablet Take 180 mg by mouth as needed.     gabapentin (NEURONTIN) 100 MG capsule TAKE 2 CAPSULES BY MOUTH 3 TIMES DAILY 540 capsule 3   KLONOPIN 0.5 MG tablet Take 1 tablet by mouth twice a day as needed TKE 1/2-1 AS NEEDED     Melatonin 3 MG TABS Take by mouth at bedtime as needed.     meloxicam (MOBIC) 15 MG tablet TAKE 1 TABLET (15 MG TOTAL) BY MOUTH DAILY. 90 tablet 1   montelukast (SINGULAIR) 10 MG tablet Take 1 tablet (10 mg total) by mouth at bedtime. Annual appt due in April  must see provider for future refills 90 tablet 0   olopatadine (PATANOL) 0.1 % ophthalmic solution INSTILL 1 DROP INTO BOTH  EYES TWICE DAILY. 15 mL 5   omeprazole (PRILOSEC) 20 MG capsule TAKE 1 CAPSULE BY MOUTH EVERY  OTHER DAY 45 capsule 3   Phenylephrine HCl 5 MG TABS Take by mouth as needed.     pseudoephedrine (SUDAFED) 120 MG 12 hr tablet Take 1 tablet (120 mg total) by mouth 2 (two) times daily as needed for  congestion. 90 tablet 0   QUEtiapine (SEROQUEL) 25 MG tablet Take 25 mg by mouth at bedtime.     simvastatin (ZOCOR) 20 MG tablet TAKE 1 TABLET BY MOUTH AT  BEDTIME 90 tablet 3   VITAMIN D3 1.25 MG (50000 UT) capsule Take by mouth.     vortioxetine HBr (TRINTELLIX) 20 MG TABS tablet Trintellix 20 mg tablet  TK 1 T PO D     No current facility-administered medications on file prior to visit.    Review of Systems  Constitutional:  Negative for fever.  Eyes:  Negative for visual disturbance.  Respiratory:  Negative for cough, shortness of breath and wheezing.   Cardiovascular:  Negative for chest pain, palpitations and leg swelling.  Gastrointestinal:  Negative for abdominal pain, blood in stool, constipation and diarrhea (occ with IBS).       No gerd  Genitourinary:  Negative for dysuria.  Musculoskeletal:  Positive for arthralgias (hip) and back pain (mild).  Skin:  Negative for rash.  Neurological:  Positive for headaches (occ). Negative for light-headedness.  Psychiatric/Behavioral:  Positive for dysphoric mood. The patient is nervous/anxious.        Objective:   Vitals:  11/19/22 0752  BP: 128/74  Pulse: 80  Temp: 98.4 F (36.9 C)  SpO2: 92%   Filed Weights   11/19/22 0752  Weight: 197 lb (89.4 kg)   Body mass index is 34.9 kg/m.  BP Readings from Last 3 Encounters:  11/19/22 128/74  11/09/22 (!) 130/98  09/01/22 132/78    Wt Readings from Last 3 Encounters:  11/19/22 197 lb (89.4 kg)  11/09/22 197 lb 3.2 oz (89.4 kg)  09/01/22 193 lb 8 oz (87.8 kg)       Physical Exam Constitutional: She appears well-developed and well-nourished. No distress.  HENT:  Head: Normocephalic and atraumatic.  Right Ear: External ear normal. Normal ear canal and TM Left Ear: External ear normal.  Normal ear canal and TM Mouth/Throat: Oropharynx is clear and moist.  Eyes: Conjunctivae normal.  Neck: Neck supple. No tracheal deviation present. No thyromegaly present.  No  carotid bruit  Cardiovascular: Normal rate, regular rhythm and normal heart sounds.   No murmur heard.  No edema. Pulmonary/Chest: Effort normal and breath sounds normal. No respiratory distress. She has no wheezes. She has no rales.  Breast: deferred   Abdominal: Soft. She exhibits no distension. There is no tenderness.  Lymphadenopathy: She has no cervical adenopathy.  Skin: Skin is warm and dry. She is not diaphoretic.  Psychiatric: She has a normal mood and affect. Her behavior is normal.     Lab Results  Component Value Date   WBC 11.0 (H) 11/11/2021   HGB 12.7 11/11/2021   HCT 38.0 11/11/2021   PLT 505.0 (H) 11/11/2021   GLUCOSE 93 11/11/2021   CHOL 227 (H) 11/11/2021   TRIG 375.0 (H) 11/11/2021   HDL 77.10 11/11/2021   LDLDIRECT 96.0 11/11/2021   ALT 23 11/11/2021   AST 19 11/11/2021   NA 137 11/11/2021   K 3.9 11/11/2021   CL 100 11/11/2021   CREATININE 0.77 11/11/2021   BUN 10 11/11/2021   CO2 28 11/11/2021   TSH 2.96 11/11/2021   HGBA1C 5.5 11/11/2021         Assessment & Plan:   Physical exam: Screening blood work  ordered Exercise  walking Weight  knows she needs to work on weight Substance abuse  none   Reviewed recommended immunizations.   Health Maintenance  Topic Date Due   Fecal DNA (Cologuard)  Never done   MAMMOGRAM  06/14/2019   PAP SMEAR-Modifier  06/06/2021   COVID-19 Vaccine (5 - 2023-24 season) 12/05/2022 (Originally 04/09/2022)   Zoster Vaccines- Shingrix (1 of 2) 02/18/2023 (Originally 05/03/1987)   INFLUENZA VACCINE  03/10/2023   DTaP/Tdap/Td (3 - Td or Tdap) 06/06/2029   HIV Screening  Completed   HPV VACCINES  Aged Out   Hepatitis C Screening  Discontinued          See Problem List for Assessment and Plan of chronic medical problems.

## 2022-11-18 NOTE — Patient Instructions (Addendum)
Blood work was ordered.   The lab is on the first floor.    Medications changes include :   None     Return in about 1 year (around 11/19/2023) for Physical Exam.    Health Maintenance, Female Adopting a healthy lifestyle and getting preventive care are important in promoting health and wellness. Ask your health care provider about: The right schedule for you to have regular tests and exams. Things you can do on your own to prevent diseases and keep yourself healthy. What should I know about diet, weight, and exercise? Eat a healthy diet  Eat a diet that includes plenty of vegetables, fruits, low-fat dairy products, and lean protein. Do not eat a lot of foods that are high in solid fats, added sugars, or sodium. Maintain a healthy weight Body mass index (BMI) is used to identify weight problems. It estimates body fat based on height and weight. Your health care provider can help determine your BMI and help you achieve or maintain a healthy weight. Get regular exercise Get regular exercise. This is one of the most important things you can do for your health. Most adults should: Exercise for at least 150 minutes each week. The exercise should increase your heart rate and make you sweat (moderate-intensity exercise). Do strengthening exercises at least twice a week. This is in addition to the moderate-intensity exercise. Spend less time sitting. Even light physical activity can be beneficial. Watch cholesterol and blood lipids Have your blood tested for lipids and cholesterol at 55 years of age, then have this test every 5 years. Have your cholesterol levels checked more often if: Your lipid or cholesterol levels are high. You are older than 55 years of age. You are at high risk for heart disease. What should I know about cancer screening? Depending on your health history and family history, you may need to have cancer screening at various ages. This may include screening  for: Breast cancer. Cervical cancer. Colorectal cancer. Skin cancer. Lung cancer. What should I know about heart disease, diabetes, and high blood pressure? Blood pressure and heart disease High blood pressure causes heart disease and increases the risk of stroke. This is more likely to develop in people who have high blood pressure readings or are overweight. Have your blood pressure checked: Every 3-5 years if you are 2-70 years of age. Every year if you are 54 years old or older. Diabetes Have regular diabetes screenings. This checks your fasting blood sugar level. Have the screening done: Once every three years after age 108 if you are at a normal weight and have a low risk for diabetes. More often and at a younger age if you are overweight or have a high risk for diabetes. What should I know about preventing infection? Hepatitis B If you have a higher risk for hepatitis B, you should be screened for this virus. Talk with your health care provider to find out if you are at risk for hepatitis B infection. Hepatitis C Testing is recommended for: Everyone born from [birth year] through 1963. Anyone with known risk factors for hepatitis C. Sexually transmitted infections (STIs) Get screened for STIs, including gonorrhea and chlamydia, if: You are sexually active and are younger than 55 years of age. You are older than 55 years of age and your health care provider tells you that you are at risk for this type of infection. Your sexual activity has changed since you were last screened, and you are  at increased risk for chlamydia or gonorrhea. Ask your health care provider if you are at risk. Ask your health care provider about whether you are at high risk for HIV. Your health care provider may recommend a prescription medicine to help prevent HIV infection. If you choose to take medicine to prevent HIV, you should first get tested for HIV. You should then be tested every 3 months for as long as you  are taking the medicine. Pregnancy If you are about to stop having your period (premenopausal) and you may become pregnant, seek counseling before you get pregnant. Take 400 to 800 micrograms (mcg) of folic acid every day if you become pregnant. Ask for birth control (contraception) if you want to prevent pregnancy. Osteoporosis and menopause Osteoporosis is a disease in which the bones lose minerals and strength with aging. This can result in bone fractures. If you are 71 years old or older, or if you are at risk for osteoporosis and fractures, ask your health care provider if you should: Be screened for bone loss. Take a calcium or vitamin D supplement to lower your risk of fractures. Be given hormone replacement therapy (HRT) to treat symptoms of menopause. Follow these instructions at home: Alcohol use Do not drink alcohol if: Your health care provider tells you not to drink. You are pregnant, may be pregnant, or are planning to become pregnant. If you drink alcohol: Limit how much you have to: 0-1 drink a day. Know how much alcohol is in your drink. In the [place] one drink equals one 12 oz bottle of beer (355 mL), one 5 oz glass of wine (148 mL), or one 1 oz glass of hard liquor (44 mL). Lifestyle Do not use any products that contain nicotine or tobacco. These products include cigarettes, chewing tobacco, and vaping devices, such as e-cigarettes. If you need help quitting, ask your health care provider. Do not use street drugs. Do not share needles. Ask your health care provider for help if you need support or information about quitting drugs. General instructions Schedule regular health, dental, and eye exams. Stay current with your vaccines. Tell your health care provider if: You often feel depressed. You have ever been abused or do not feel safe at home. Summary Adopting a healthy lifestyle and getting preventive care are important in promoting health and wellness. Follow your  health care provider's instructions about healthy diet, exercising, and getting tested or screened for diseases. Follow your health care provider's instructions on monitoring your cholesterol and blood pressure. This information is not intended to replace advice given to you by your health care provider. Make sure you discuss any questions you have with your health care provider. Document Revised: 12/15/2020 Document Reviewed: 12/15/2020 Elsevier Patient Education  [employer].

## 2022-11-19 ENCOUNTER — Ambulatory Visit (INDEPENDENT_AMBULATORY_CARE_PROVIDER_SITE_OTHER): Payer: No Typology Code available for payment source | Admitting: Internal Medicine

## 2022-11-19 VITALS — BP 128/74 | HR 80 | Temp 98.4°F | Ht 63.0 in | Wt 197.0 lb

## 2022-11-19 DIAGNOSIS — E782 Mixed hyperlipidemia: Secondary | ICD-10-CM

## 2022-11-19 DIAGNOSIS — R739 Hyperglycemia, unspecified: Secondary | ICD-10-CM

## 2022-11-19 DIAGNOSIS — Z0000 Encounter for general adult medical examination without abnormal findings: Secondary | ICD-10-CM

## 2022-11-19 DIAGNOSIS — E559 Vitamin D deficiency, unspecified: Secondary | ICD-10-CM

## 2022-11-19 DIAGNOSIS — K219 Gastro-esophageal reflux disease without esophagitis: Secondary | ICD-10-CM

## 2022-11-19 DIAGNOSIS — J301 Allergic rhinitis due to pollen: Secondary | ICD-10-CM

## 2022-11-19 DIAGNOSIS — E669 Obesity, unspecified: Secondary | ICD-10-CM

## 2022-11-19 MED ORDER — MONTELUKAST SODIUM 10 MG PO TABS: 10.0000 mg | ORAL_TABLET | Freq: Every day | ORAL | 3 refills | Status: DC

## 2022-11-19 MED ORDER — SIMVASTATIN 20 MG PO TABS: 20.0000 mg | ORAL_TABLET | Freq: Every day | ORAL | 3 refills | Status: DC

## 2022-11-19 MED ORDER — OMEPRAZOLE 20 MG PO CPDR: DELAYED_RELEASE_CAPSULE | ORAL | 3 refills | Status: DC

## 2022-11-19 NOTE — Assessment & Plan Note (Signed)
Chronic GERD controlled Continue omeprazole 20 mg every other day Discussed trying to keep the omeprazole to a minimum and ideally use as needed for changing to Pepcid

## 2022-11-19 NOTE — Assessment & Plan Note (Signed)
Chronic Controlled Continue singulair and over-the-counter antihistamine

## 2022-11-19 NOTE — Assessment & Plan Note (Signed)
Chronic Check a1c Low sugar / carb diet Stressed regular exercise  

## 2022-11-19 NOTE — Assessment & Plan Note (Signed)
Chronic Encouraged weight loss Encouraged regular exercise Discussed healthy diet

## 2022-11-19 NOTE — Assessment & Plan Note (Signed)
Chronic Regular exercise and healthy diet encouraged Check lipid panel, CMP, TSH Continue simvastatin 20 mg at bedtime

## 2022-11-19 NOTE — Assessment & Plan Note (Signed)
Chronic Taking vitamin D daily Check vitamin D level  

## 2022-11-22 ENCOUNTER — Encounter: Payer: Self-pay | Admitting: *Deleted

## 2022-12-19 IMAGING — DX DG LUMBAR SPINE 2-3V
3 series · 3 of 3 positions shown · non-contrast
Comparison: Lumbar spine x-ray 04/23/2013.

CLINICAL DATA: Lumbar pain.

EXAM:
LUMBAR SPINE - 2-3 VIEW

[l-spine ap]
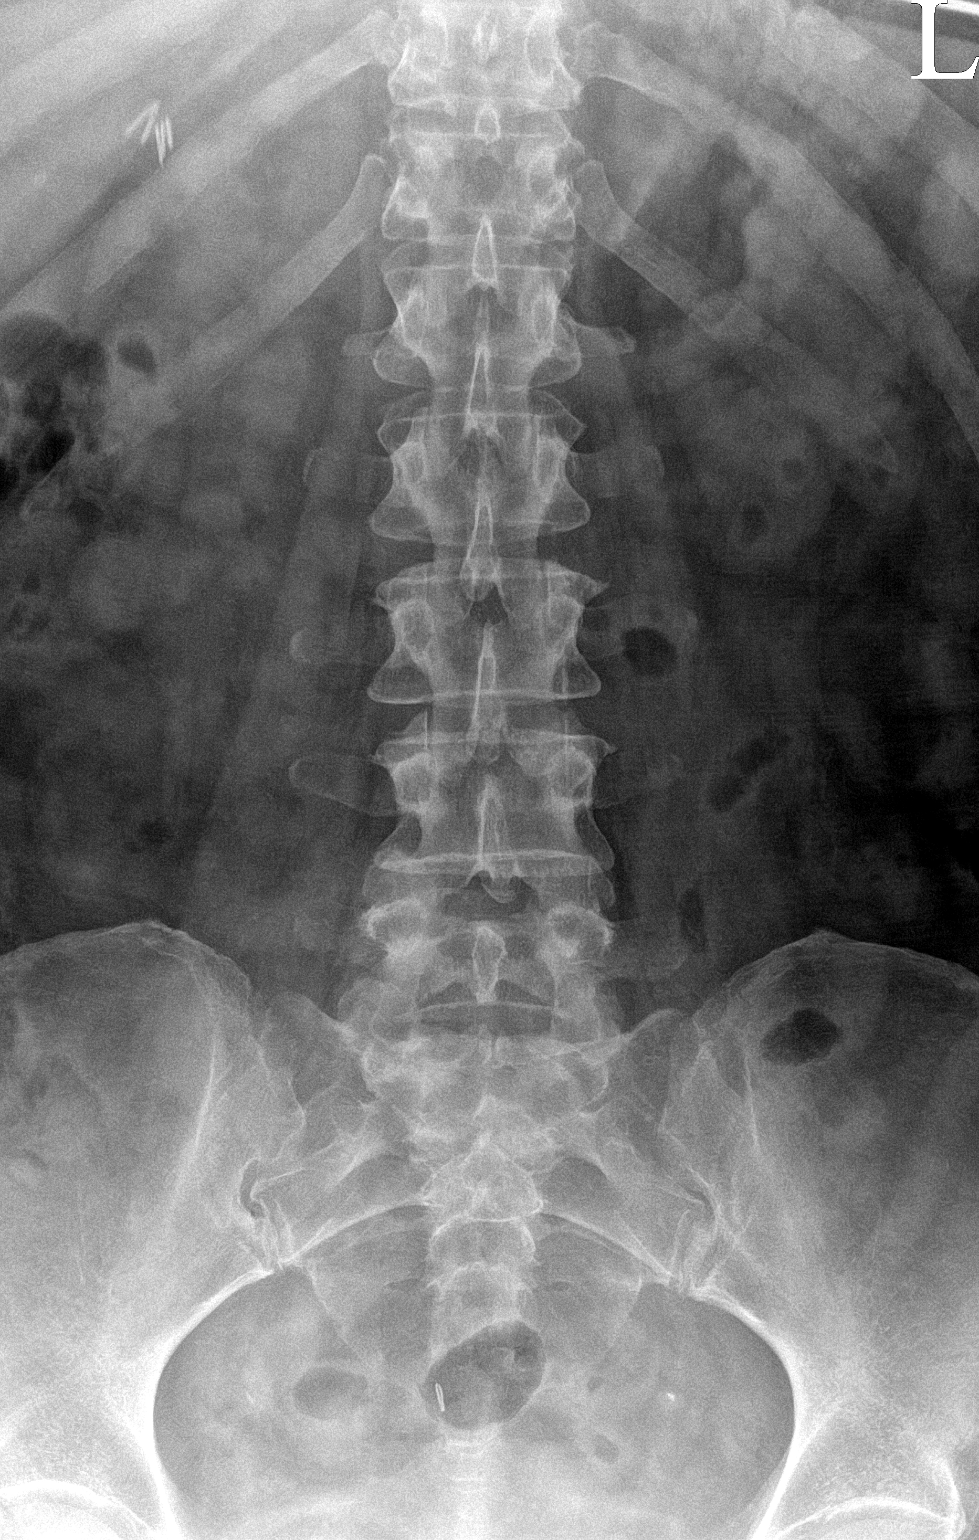

[l-spine lateral]
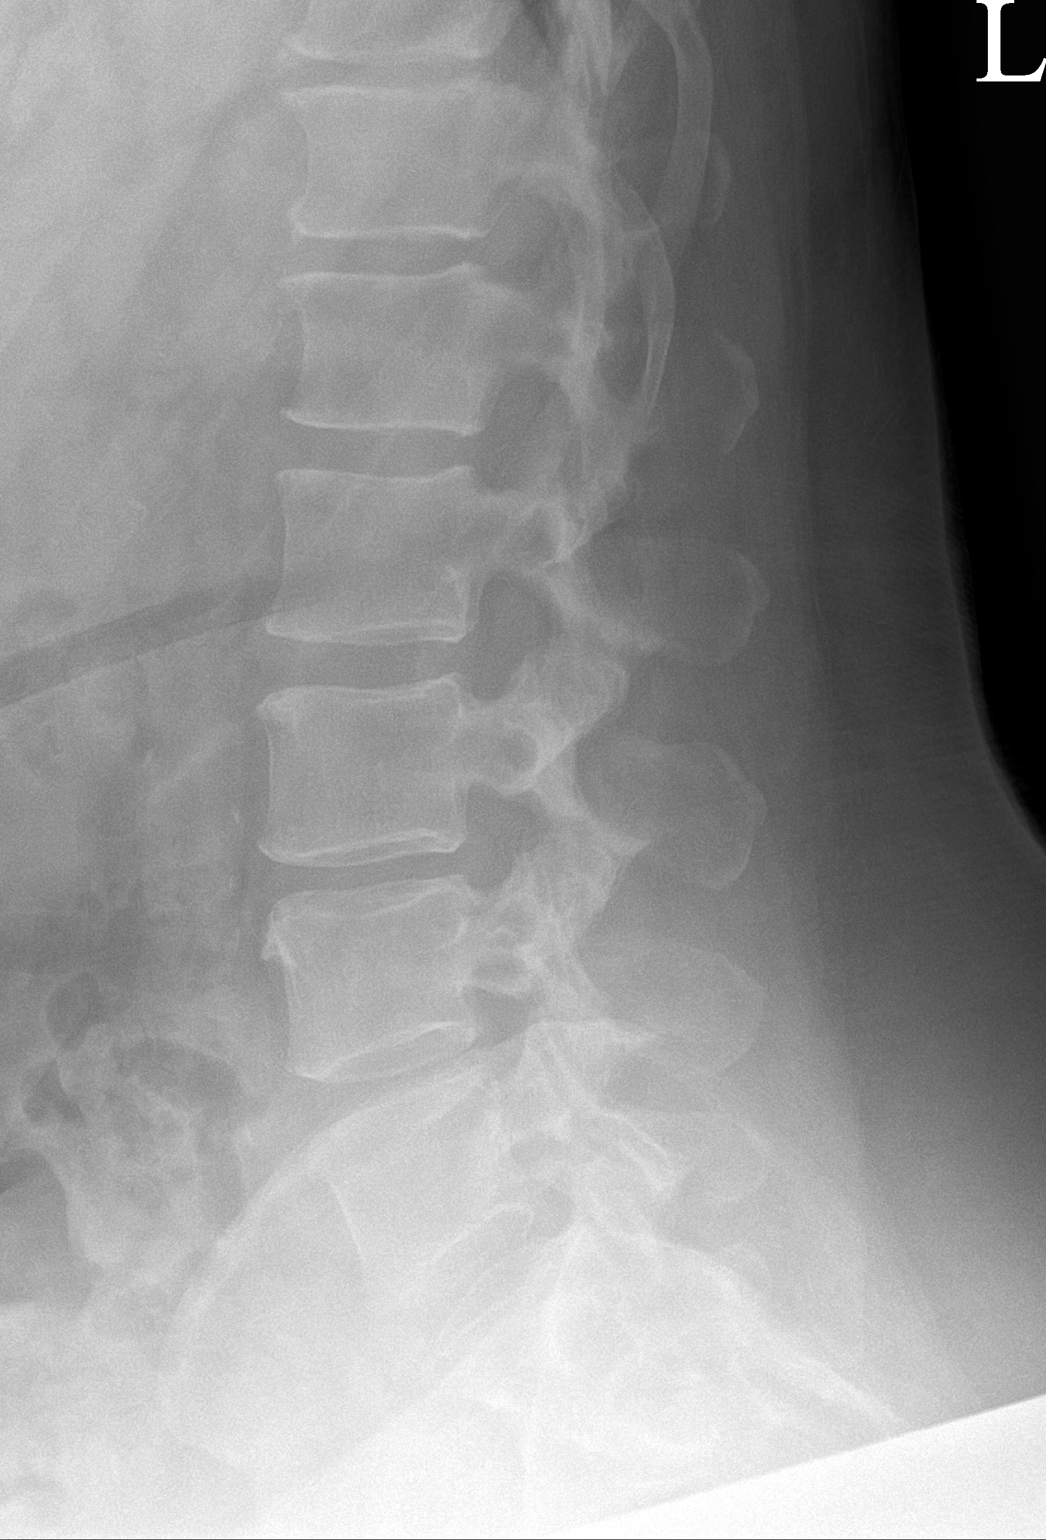

[l-spine spot]
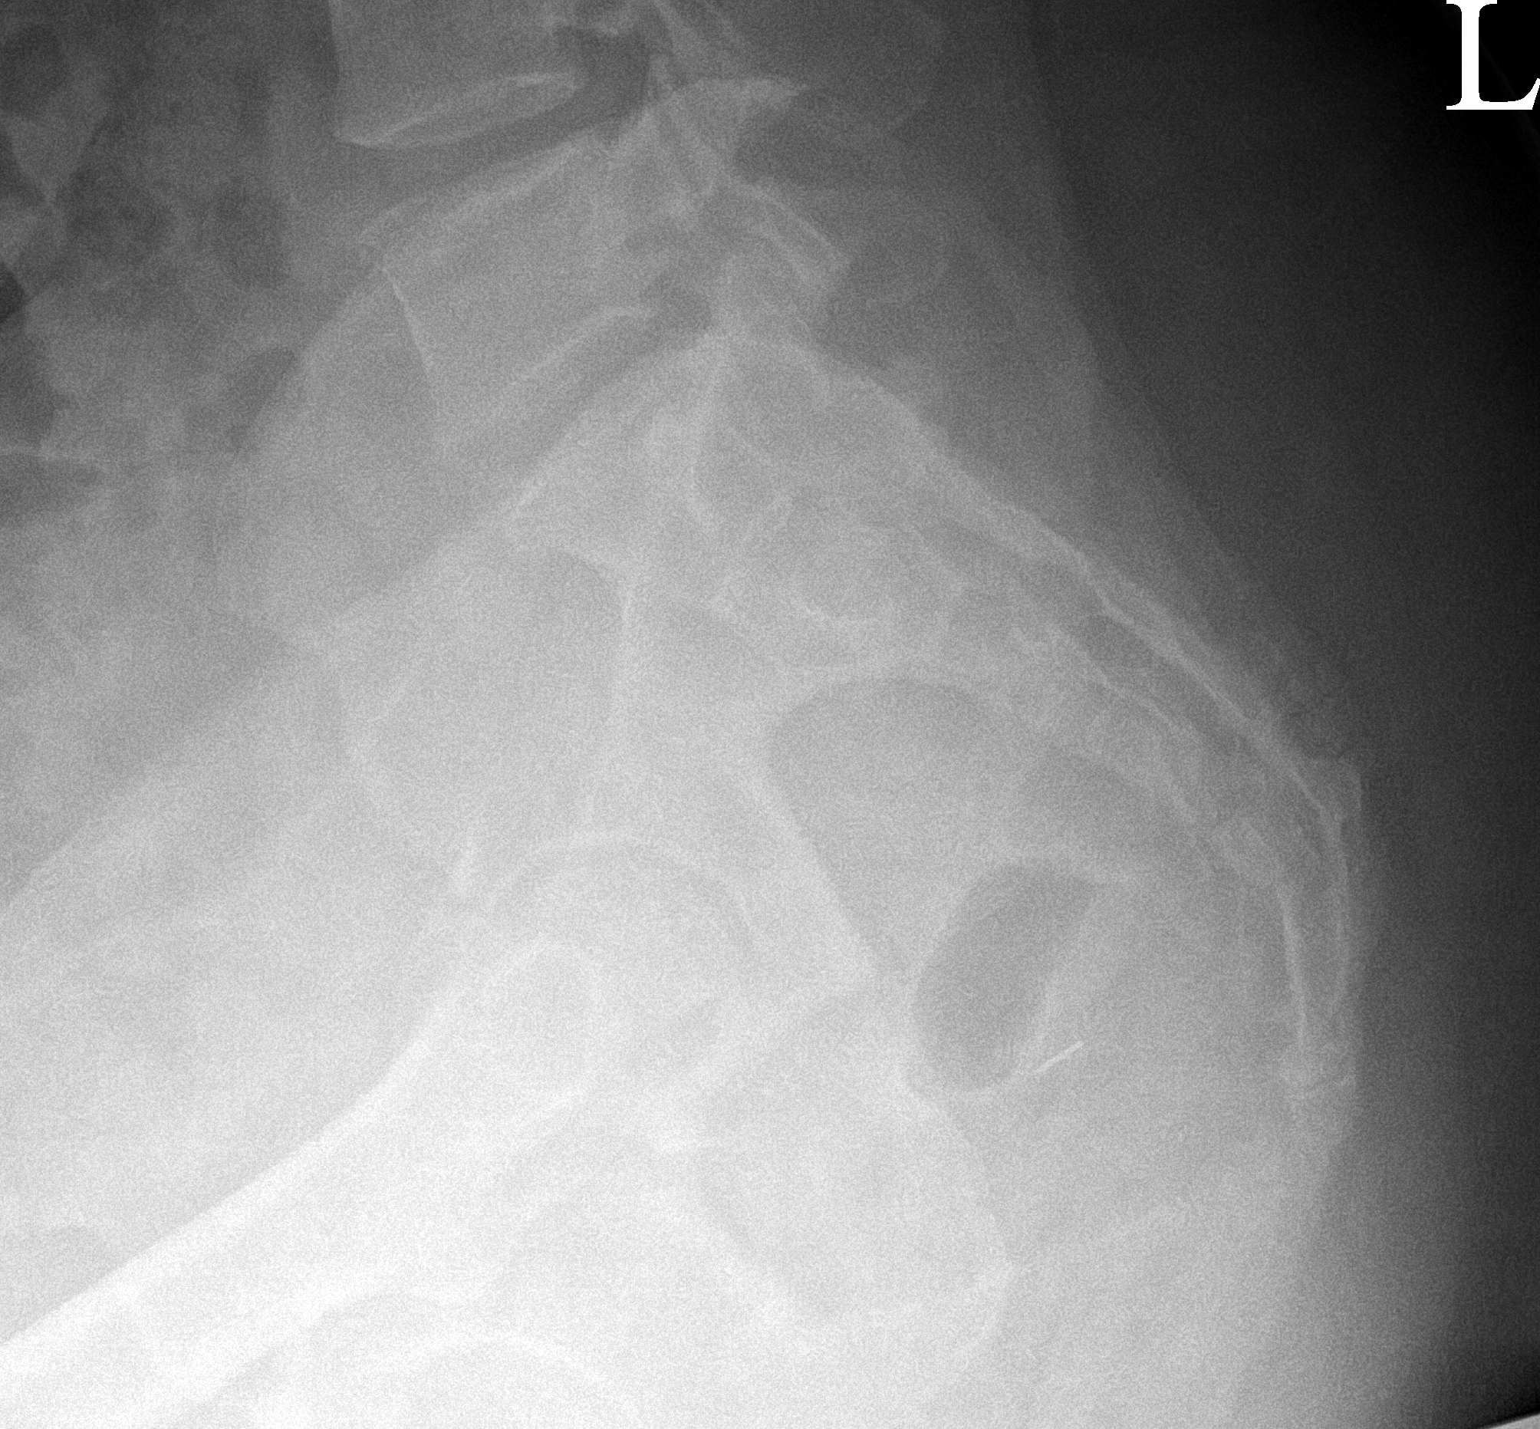

[3 of 3 positions shown; findings below may reference images not displayed]

FINDINGS: There is no evidence of lumbar spine fracture. Alignment is normal.
Intervertebral disc spaces are maintained. There are mild
degenerative endplate osteophytes throughout the mid lumbar spine,
new from prior. Surgical clips are seen in the right upper quadrant
and pelvis.
IMPRESSION: 1. No acute fracture or malalignment.
2. Mild degenerative changes.

## 2022-12-24 IMAGING — DX DG ABDOMEN 1V
1 series · 1 of 1 positions shown · non-contrast
Comparison: MRI lumbar spine 04/26/2021. X-ray lumbar spine
04/23/2021. CT abdomen and pelvis 05/04/2018.

CLINICAL DATA: Low back pain.

EXAM:
ABDOMEN - 1 VIEW

[abdomen supine]
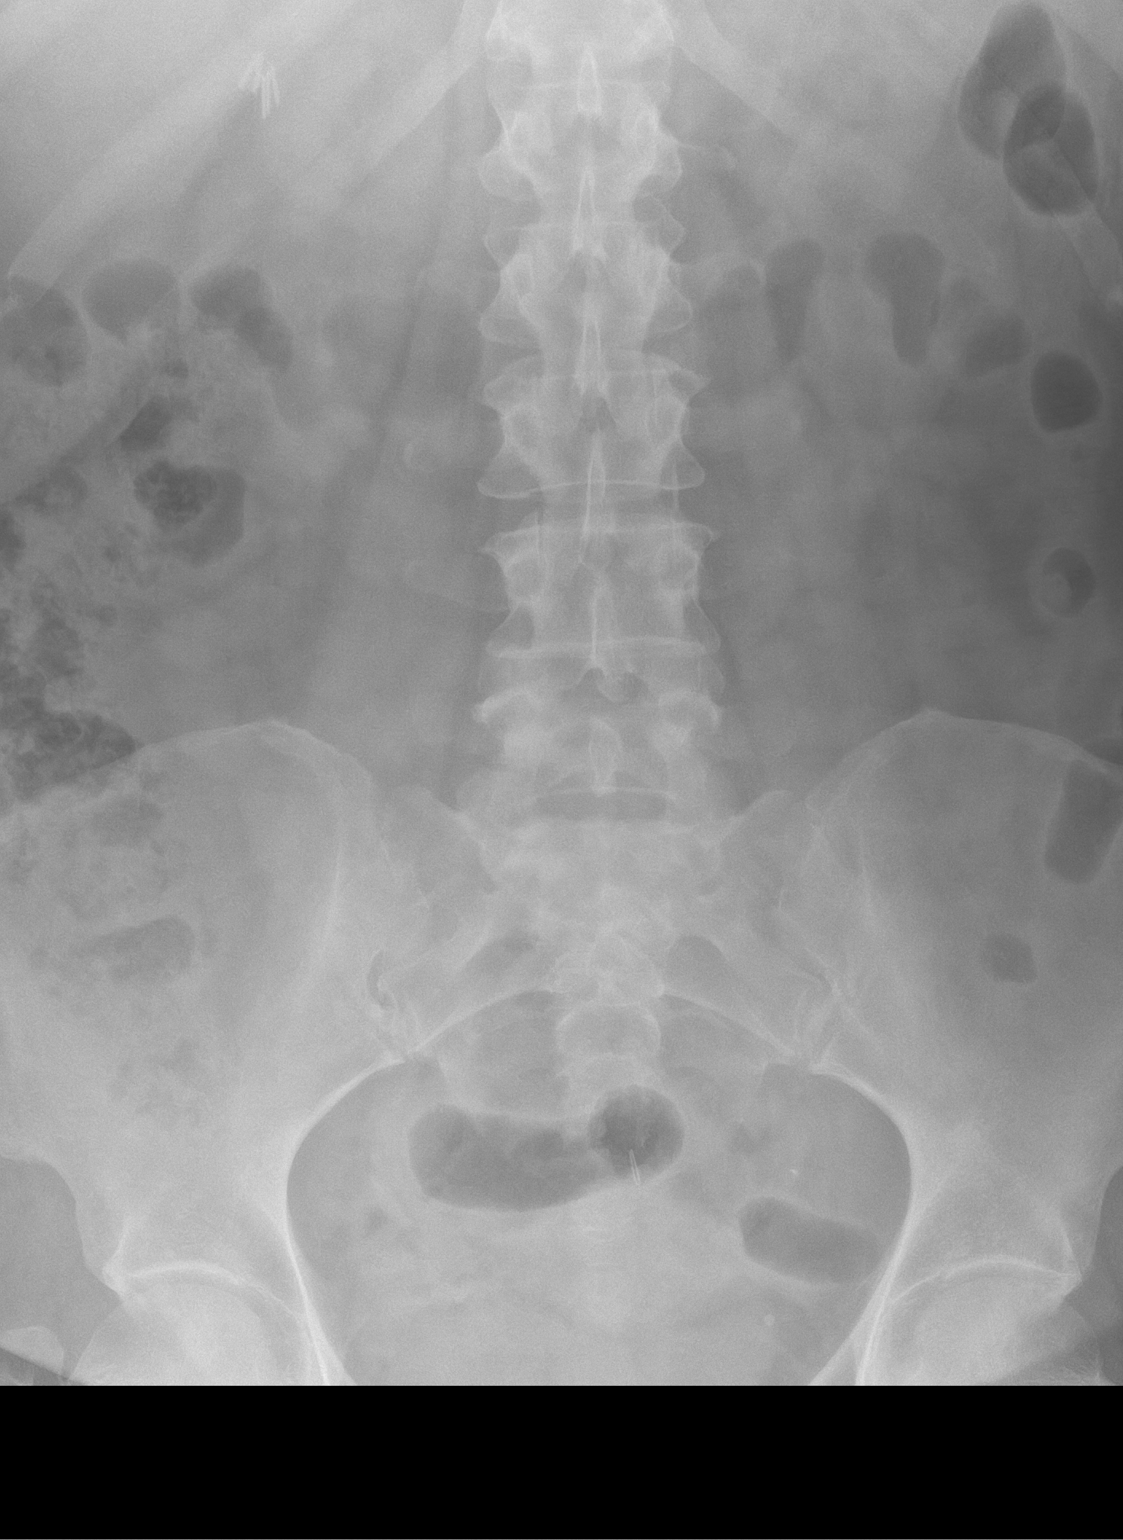

[1 of 1 positions shown; findings below may reference images not displayed]

FINDINGS: The bowel gas pattern is normal. No radio-opaque calculi or other
significant radiographic abnormality are seen. There is a stable
phlebolith in the left hemipelvis. There are surgical clips in the
pelvis and right upper abdomen.
IMPRESSION: Negative.

## 2023-01-17 ENCOUNTER — Encounter: Payer: Self-pay | Admitting: Internal Medicine

## 2023-01-18 MED ORDER — BETAMETHASONE DIPROPIONATE 0.05 % EX CREA: TOPICAL_CREAM | Freq: Two times a day (BID) | CUTANEOUS | 1 refills | Status: DC | PRN

## 2023-01-24 ENCOUNTER — Other Ambulatory Visit: Payer: Self-pay | Admitting: Family Medicine

## 2023-01-26 NOTE — Progress Notes (Unsigned)
Jessica [name] D.[name] [address] [place] [address] Phone: [phone]   Assessment and Plan:     There are no diagnoses linked to this encounter.  *** - Patient has received relief with OMT in the past.  Elects for repeat OMT today.  Tolerated well per note below. - Decision today to treat with OMT was based on Physical Exam   After verbal consent patient was treated with HVLA (high velocity low amplitude), ME (muscle energy), FPR (flex positional release), ST (soft tissue), PC/PD (Pelvic Compression/ Pelvic Decompression) techniques in cervical, rib, thoracic, lumbar, and pelvic areas. Patient tolerated the procedure well with improvement in symptoms.  Patient educated on potential side effects of soreness and recommended to rest, hydrate, and use Tylenol as needed for pain control.   Pertinent previous records reviewed include ***   Follow Up: ***     Subjective:   I, Jessica [name], am serving as a Neurosurgeon for Jessica [name]  Chief Complaint: back and neck   HPI:   11/09/2022 Jessica [name] is a 57 y.o. female who presents to [facility] today for left hip pain. Hx of greater trochanteric bursitis and pinched nerve in the lower back. Sx flaring up over the past month after lifting rolling back on [place] the airport. Pain at posterior aspect of the hip. Intermittent numbness at anterior thigh. Denies groin pain. Denies mechanical sx in the hip. Has been taking Gabapentin but not Meloxicam.  She is leaving on Friday to fly to [place] to help her father who is suffering from dementia.  She will be gone for the weekend.   Pertinent review of systems: No fevers or chills   Relevant historical information: History of lumbar radiculopathy at L2 left side in the past.  She has had epidural steroid injections which have helped.  01/27/2023 Patient states   Relevant Historical Information:  ***  Additional pertinent review of systems negative.  Current Outpatient Medications  Medication Sig Dispense Refill   augmented betamethasone dipropionate (DIPROLENE-AF) 0.05 % cream Apply topically 2 (two) times daily as needed. 150 g 1   betamethasone dipropionate 0.05 % cream Apply topically 2 (two) times daily as needed (For eczema). 45 g 1   buPROPion (WELLBUTRIN XL) 300 MG 24 hr tablet Take 300 mg daily by mouth.  3   cetirizine (ZYRTEC) 10 MG tablet Take 10 mg by mouth as needed.     cyclobenzaprine (FLEXERIL) 5 MG tablet Take 1 tablet (5 mg total) by mouth 3 (three) times daily as needed for muscle spasms. 30 tablet 0   drospirenone-ethinyl estradiol (YAZ) 3-0.02 MG per tablet Take 1 tablet by mouth daily.     fexofenadine (ALLEGRA) 180 MG tablet Take 180 mg by mouth as needed.     gabapentin (NEURONTIN) 100 MG capsule TAKE 2 CAPSULES BY MOUTH 3 TIMES DAILY 540 capsule 3   KLONOPIN 0.5 MG tablet Take 1 tablet by mouth twice a day as needed TKE 1/2-1 AS NEEDED     Melatonin 3 MG TABS Take by mouth at bedtime as needed.     meloxicam (MOBIC) 15 MG tablet TAKE 1 TABLET (15 MG TOTAL) BY MOUTH DAILY. 90 tablet 1   montelukast (SINGULAIR) 10 MG tablet Take 1 tablet (10 mg total) by mouth at bedtime. Annual appt due in April  must see provider for future refills 90 tablet 3   olopatadine (PATANOL) 0.1 %  ophthalmic solution INSTILL 1 DROP INTO BOTH  EYES TWICE DAILY. 15 mL 5   omeprazole (PRILOSEC) 20 MG capsule TAKE 1 CAPSULE BY MOUTH DAILY 90 capsule 3   Phenylephrine HCl 5 MG TABS Take by mouth as needed.     pseudoephedrine (SUDAFED) 120 MG 12 hr tablet Take 1 tablet (120 mg total) by mouth 2 (two) times daily as needed for congestion. 90 tablet 0   QUEtiapine (SEROQUEL) 25 MG tablet Take 25 mg by mouth at bedtime.     simvastatin (ZOCOR) 20 MG tablet Take 1 tablet (20 mg total) by mouth at bedtime. 90 tablet 3   VITAMIN D3 1.25 MG (50000 UT) capsule Take by mouth.     vortioxetine HBr  (TRINTELLIX) 20 MG TABS tablet Trintellix 20 mg tablet  TK 1 T PO D     No current facility-administered medications for this visit.      Objective:     There were no vitals filed for this visit.    There is no height or weight on file to calculate BMI.    Physical Exam:     General: Well-appearing, cooperative, sitting comfortably in no acute distress.   OMT Physical Exam:  ASIS Compression Test: Positive Right Cervical: TTP paraspinal, *** Rib: Bilateral elevated first rib with TTP Thoracic: TTP paraspinal,*** Lumbar: TTP paraspinal,*** Pelvis: Right anterior innominate  Electronically signed by:  Jessica [name] D.[name] 7:06 AM 01/26/23

## 2023-01-27 ENCOUNTER — Ambulatory Visit: Payer: No Typology Code available for payment source | Admitting: Sports Medicine

## 2023-01-27 VITALS — BP 122/84 | HR 105 | Ht 63.0 in | Wt 198.0 lb

## 2023-01-27 DIAGNOSIS — G8929 Other chronic pain: Secondary | ICD-10-CM | POA: Diagnosis not present

## 2023-01-27 DIAGNOSIS — M9905 Segmental and somatic dysfunction of pelvic region: Secondary | ICD-10-CM | POA: Diagnosis not present

## 2023-01-27 DIAGNOSIS — M542 Cervicalgia: Secondary | ICD-10-CM | POA: Diagnosis not present

## 2023-01-27 DIAGNOSIS — M9903 Segmental and somatic dysfunction of lumbar region: Secondary | ICD-10-CM | POA: Diagnosis not present

## 2023-01-27 DIAGNOSIS — M5442 Lumbago with sciatica, left side: Secondary | ICD-10-CM | POA: Diagnosis not present

## 2023-01-27 DIAGNOSIS — M9901 Segmental and somatic dysfunction of cervical region: Secondary | ICD-10-CM | POA: Diagnosis not present

## 2023-01-27 DIAGNOSIS — M9902 Segmental and somatic dysfunction of thoracic region: Secondary | ICD-10-CM

## 2023-01-27 DIAGNOSIS — M9908 Segmental and somatic dysfunction of rib cage: Secondary | ICD-10-CM

## 2023-03-18 ENCOUNTER — Telehealth: Payer: No Typology Code available for payment source | Admitting: Nurse Practitioner

## 2023-03-18 ENCOUNTER — Encounter: Payer: Self-pay | Admitting: Internal Medicine

## 2023-06-06 ENCOUNTER — Other Ambulatory Visit: Payer: Self-pay | Admitting: Internal Medicine

## 2023-07-14 NOTE — Progress Notes (Signed)
Subjective:    Patient ID: Jessica [name], female    DOB: [birth year], 57 y.o.   MRN: [id number]      HPI Jessica [name] is here for  Chief Complaint  Patient presents with   Cough    Cough with chest discomfort; Sometimes productive; A couple days of cough; Very fatigued and tired    She is here for an acute visit for cold symptoms.   Her symptoms started several days ago  She is experiencing cough, tightness in chest, maybe a little SOB and fatigue, low grade fevers.  This is a different cold for her which is part of the reason she was concerned.  She has tried taking  advil, sudafed, cough drops, robitussin dm   Son w/ walking pna.  Daughter had influenza not long ago.  Medications and allergies reviewed with patient and updated if appropriate.  Current Outpatient Medications on File Prior to Visit  Medication Sig Dispense Refill   augmented betamethasone dipropionate (DIPROLENE-AF) 0.05 % cream Apply topically 2 (two) times daily as needed. 150 g 1   betamethasone dipropionate 0.05 % cream APPLY TOPICALLY TWICE DAILY AS  NEEDED FOR ECZEMA 90 g 2   buPROPion (WELLBUTRIN XL) 300 MG 24 hr tablet Take 300 mg daily by mouth.  3   cetirizine (ZYRTEC) 10 MG tablet Take 10 mg by mouth as needed.     cyclobenzaprine (FLEXERIL) 5 MG tablet Take 1 tablet (5 mg total) by mouth 3 (three) times daily as needed for muscle spasms. 30 tablet 0   drospirenone-ethinyl estradiol (YAZ) 3-0.02 MG per tablet Take 1 tablet by mouth daily.     fexofenadine (ALLEGRA) 180 MG tablet Take 180 mg by mouth as needed.     gabapentin (NEURONTIN) 100 MG capsule TAKE 2 CAPSULES BY MOUTH 3 TIMES DAILY 540 capsule 3   KLONOPIN 0.5 MG tablet Take 1 tablet by mouth twice a day as needed TKE 1/2-1 AS NEEDED     montelukast (SINGULAIR) 10 MG tablet Take 1 tablet (10 mg total) by mouth at bedtime. Annual appt due in April  must see provider for future refills 90 tablet 3   olopatadine (PATANOL) 0.1 % ophthalmic  solution INSTILL 1 DROP INTO BOTH  EYES TWICE DAILY. 15 mL 5   omeprazole (PRILOSEC) 20 MG capsule TAKE 1 CAPSULE BY MOUTH DAILY 90 capsule 3   pseudoephedrine (SUDAFED) 120 MG 12 hr tablet Take 1 tablet (120 mg total) by mouth 2 (two) times daily as needed for congestion. 90 tablet 0   QUEtiapine (SEROQUEL) 25 MG tablet Take 25 mg by mouth at bedtime.     simvastatin (ZOCOR) 20 MG tablet Take 1 tablet (20 mg total) by mouth at bedtime. 90 tablet 3   VITAMIN D3 1.25 MG (50000 UT) capsule Take by mouth.     vortioxetine HBr (TRINTELLIX) 20 MG TABS tablet Trintellix 20 mg tablet  TK 1 T PO D     No current facility-administered medications on file prior to visit.    Review of Systems  Constitutional:  Positive for appetite change (decreased), fatigue and fever (low grade).  HENT:  Positive for congestion (minimal). Negative for sinus pressure and sore throat.   Respiratory:  Positive for cough, chest tightness and shortness of breath (maybe a little). Negative for wheezing.   Gastrointestinal:  Negative for diarrhea and nausea.  Musculoskeletal:  Negative for myalgias.  Neurological:  Negative for light-headedness and headaches.  Objective:   Vitals:   07/15/23 1107  BP: 124/88  Pulse: 87  Temp: 99 F (37.2 C)  SpO2: 99%   BP Readings from Last 3 Encounters:  07/15/23 124/88  01/27/23 122/84  11/19/22 128/74   Wt Readings from Last 3 Encounters:  07/15/23 193 lb (87.5 kg)  01/27/23 198 lb (89.8 kg)  11/19/22 197 lb (89.4 kg)   Body mass index is 34.19 kg/m.    Physical Exam Constitutional:      General: She is not in acute distress.    Appearance: Normal appearance. She is not ill-appearing.  HENT:     Head: Normocephalic and atraumatic.     Right Ear: Tympanic membrane, ear canal and external ear normal.     Left Ear: Tympanic membrane, ear canal and external ear normal.     Mouth/Throat:     Mouth: Mucous membranes are moist.     Pharynx: No oropharyngeal  exudate or posterior oropharyngeal erythema.  Eyes:     Conjunctiva/sclera: Conjunctivae normal.  Cardiovascular:     Rate and Rhythm: Normal rate and regular rhythm.  Pulmonary:     Effort: Pulmonary effort is normal. No respiratory distress.     Breath sounds: Normal breath sounds. No wheezing or rales.  Musculoskeletal:     Cervical back: Neck supple. No tenderness.  Lymphadenopathy:     Cervical: No cervical adenopathy.  Skin:    General: Skin is warm and dry.  Neurological:     Mental Status: She is alert.            Assessment & Plan:    See Problem List for Assessment and Plan of chronic medical problems.

## 2023-07-15 ENCOUNTER — Ambulatory Visit (INDEPENDENT_AMBULATORY_CARE_PROVIDER_SITE_OTHER): Payer: No Typology Code available for payment source

## 2023-07-15 ENCOUNTER — Ambulatory Visit: Payer: No Typology Code available for payment source | Admitting: Internal Medicine

## 2023-07-15 VITALS — BP 124/88 | HR 87 | Temp 99.0°F | Ht 63.0 in | Wt 193.0 lb

## 2023-07-15 DIAGNOSIS — R051 Acute cough: Secondary | ICD-10-CM

## 2023-07-15 DIAGNOSIS — J22 Unspecified acute lower respiratory infection: Secondary | ICD-10-CM | POA: Insufficient documentation

## 2023-07-15 MED ORDER — AZITHROMYCIN 250 MG PO TABS: ORAL_TABLET | ORAL | 0 refills | Status: AC

## 2023-07-15 MED ORDER — PROMETHAZINE-DM 6.25-15 MG/5ML PO SYRP: 5.0000 mL | ORAL_SOLUTION | Freq: Four times a day (QID) | ORAL | 0 refills | Status: DC | PRN

## 2023-07-15 NOTE — Assessment & Plan Note (Addendum)
Acute Experiencing low-grade fevers, significant fatigue, decreased appetite, cough that is productive, chest tightness and maybe some shortness of breath COVID, flu and RSV tests negative Concern for pneumonia Chest x-ray Z-Pak, promethazine-DM cough syrup

## 2023-07-15 NOTE — Patient Instructions (Addendum)
      Have a chest xray downstairs    Medications changes include :   zpak, promethazine-dm      Return if symptoms worsen or fail to improve.

## 2023-07-19 LAB — COLOGUARD: COLOGUARD: NEGATIVE

## 2023-07-19 LAB — EXTERNAL GENERIC LAB PROCEDURE: COLOGUARD: NEGATIVE

## 2023-08-09 ENCOUNTER — Encounter: Payer: Self-pay | Admitting: Internal Medicine

## 2023-08-11 ENCOUNTER — Other Ambulatory Visit: Payer: Self-pay | Admitting: Internal Medicine

## 2023-08-11 ENCOUNTER — Other Ambulatory Visit: Payer: Self-pay

## 2023-08-11 MED ORDER — MONTELUKAST SODIUM 10 MG PO TABS: 10.0000 mg | ORAL_TABLET | Freq: Every day | ORAL | 0 refills | Status: DC

## 2023-08-11 MED ORDER — BUPROPION HCL ER (XL) 300 MG PO TB24: 300.0000 mg | ORAL_TABLET | Freq: Every day | ORAL | 0 refills | Status: AC

## 2023-08-11 MED ORDER — VORTIOXETINE HBR 20 MG PO TABS: 20.0000 mg | ORAL_TABLET | Freq: Every day | ORAL | 0 refills | Status: DC

## 2023-08-11 MED ORDER — SIMVASTATIN 20 MG PO TABS: 20.0000 mg | ORAL_TABLET | Freq: Every day | ORAL | 0 refills | Status: DC

## 2023-10-18 ENCOUNTER — Other Ambulatory Visit: Payer: Self-pay | Admitting: Internal Medicine

## 2023-10-26 NOTE — Progress Notes (Unsigned)
 [facility] [address] [place] [address] Phone: [phone] Subjective:   INadine Counts, am serving as a scribe for Dr. Antoine Primas.  I'm seeing this patient by the request  of:  Pincus Sanes, MD  CC: heel pain   [id number]  12/23/2021 OMT  Updated 10/27/2023 Brendi Mccarroll Sandrea Boer is a 58 y.o. female coming in with complaint of heel pain. Was having heel pain, but not so much anymore. Having L side pain over SI. Thinks its coming from over compensation from the heel pain.       Past Medical History:  Diagnosis Date   ALLERGIC RHINITIS CAUSE UNSPECIFIED    ANXIETY DISORDER    Complication of anesthesia    CYSTIC ACNE    DEPRESSION    DYSPLASTIC NEVUS, CHEST    ELEVATED BP READING WITHOUT DX HYPERTENSION    GERD (gastroesophageal reflux disease)    INSOMNIA    Mixed hyperlipidemia    PONV (postoperative nausea and vomiting)    THROMBOCYTOSIS    Past Surgical History:  Procedure Laterality Date   BREAST SURGERY  2000   Breast reduction   CHOLECYSTECTOMY  05/04/2018   CHOLECYSTECTOMY N/A 05/04/2018   Procedure: LAPAROSCOPIC CHOLECYSTECTOMY;  Surgeon: Violeta Gelinas, MD;  Location: [facility] OR;  Service: General;  Laterality: N/A;   Deviated septum repair  1997   Social History   Socioeconomic History   Marital status: Married    Spouse name: paul   Number of children: Not on file   Years of education: Not on file   Highest education level: Master's degree (e.g., MA, MS, MEng, MEd, MSW, MBA)  Occupational History   Not on file  Tobacco Use   Smoking status: Never   Smokeless tobacco: Never  Vaping Use   Vaping status: Never Used  Substance and Sexual Activity   Alcohol use: No   Drug use: No   Sexual activity: Not on file  Other Topics Concern   Not on file  Social History Narrative   Married lives with spouse and two kids. She is originally from [place] to [place]   Social Drivers of [employer]: Low Risk  (07/14/2023)   Overall Financial Resource Strain (CARDIA)    Difficulty of Paying Living Expenses: Not very hard  Food Insecurity: No Food Insecurity (07/14/2023)   Hunger Vital Sign    Worried About Running Out of Food in the Last Year: Never true    Ran Out of Food in the Last Year: Never true  Transportation Needs: No Transportation Needs (07/14/2023)   PRAPARE - Administrator, Civil Service (Medical): No    Lack of Transportation (Non-Medical): No  Physical Activity: Sufficiently Active (07/14/2023)   Exercise Vital Sign    Days of Exercise per Week: 6 days    Minutes of Exercise per Session: 30 min  Stress: Stress Concern Present (07/14/2023)   [employer] of Occupational Health - Occupational Stress Questionnaire    Feeling of Stress : To some extent  Social Connections: Unknown (07/14/2023)   Social Connection and Isolation Panel [NHANES]    Frequency of Communication with Friends and Family: More than three times a week    Frequency of Social Gatherings with Friends and Family: Once a week    Attends Religious Services: More than 4 times per year    Active Member of [employer] or Organizations: Patient declined    Attends [employer]  or Organization Meetings: Not on file    Marital Status: Married   No Known Allergies Family History  Problem Relation Age of Onset   Hyperlipidemia Father    Hypertension Father    Depression Sister    Coronary artery disease Paternal Grandfather     Current Outpatient Medications (Endocrine & Metabolic):    drospirenone-ethinyl estradiol (YAZ) 3-0.02 MG per tablet, Take 1 tablet by mouth daily.  Current Outpatient Medications (Cardiovascular):    simvastatin (ZOCOR) 20 MG tablet, TAKE 1 TABLET BY MOUTH AT  BEDTIME  Current Outpatient Medications (Respiratory):    cetirizine (ZYRTEC) 10 MG tablet, Take 10 mg by mouth as needed.   fexofenadine (ALLEGRA) 180 MG tablet, Take 180 mg by mouth as  needed.   montelukast (SINGULAIR) 10 MG tablet, Take 1 tablet (10 mg total) by mouth at bedtime. Annual appt due in April  must see provider for future refills   promethazine-dextromethorphan (PROMETHAZINE-DM) 6.25-15 MG/5ML syrup, Take 5 mLs by mouth 4 (four) times daily as needed.   pseudoephedrine (SUDAFED) 120 MG 12 hr tablet, Take 1 tablet (120 mg total) by mouth 2 (two) times daily as needed for congestion.    Current Outpatient Medications (Other):    augmented betamethasone dipropionate (DIPROLENE-AF) 0.05 % cream, Apply topically 2 (two) times daily as needed.   betamethasone dipropionate 0.05 % cream, APPLY TOPICALLY TWICE DAILY AS  NEEDED FOR ECZEMA   buPROPion (WELLBUTRIN XL) 300 MG 24 hr tablet, Take 1 tablet (300 mg total) by mouth daily.   cyclobenzaprine (FLEXERIL) 5 MG tablet, Take 1 tablet (5 mg total) by mouth 3 (three) times daily as needed for muscle spasms.   gabapentin (NEURONTIN) 100 MG capsule, TAKE 2 CAPSULES BY MOUTH 3 TIMES DAILY   KLONOPIN 0.5 MG tablet, Take 1 tablet by mouth twice a day as needed TKE 1/2-1 AS NEEDED   olopatadine (PATANOL) 0.1 % ophthalmic solution, INSTILL 1 DROP INTO BOTH  EYES TWICE DAILY.   omeprazole (PRILOSEC) 20 MG capsule, TAKE 1 CAPSULE BY MOUTH DAILY   QUEtiapine (SEROQUEL) 25 MG tablet, Take 25 mg by mouth at bedtime.   Vilazodone HCl (VIIBRYD) 40 MG TABS, Take 0.5 tablets (20 mg total) by mouth daily.   VITAMIN D3 1.25 MG (50000 UT) capsule, Take by mouth.   Reviewed prior external information including notes and imaging from  primary care provider As well as notes that were available from care everywhere and other healthcare systems.  Past medical history, social, surgical and family history all reviewed in electronic medical record.  No pertanent information unless stated regarding to the chief complaint.   Review of Systems:  No headache, visual changes, nausea, vomiting, diarrhea, constipation, dizziness, abdominal pain, skin  rash, fevers, chills, night sweats, weight loss, swollen lymph nodes, body aches, joint swelling, chest pain, shortness of breath, mood changes. POSITIVE muscle aches  Objective  Blood pressure (!) 130/90, pulse (!) 102, height 5\' 3"  (1.6 m), last menstrual period 12/15/2011, SpO2 97%.   General: No apparent distress alert and oriented x3 mood and affect normal, dressed appropriately.  HEENT: Pupils equal, extraocular movements intact  Respiratory: Patient's speak in full sentences and does not appear short of breath  Cardiovascular: No lower extremity edema, non tender, no erythema  Heel exam shows mild discomfort noted on the plantar aspect.  Still has some mild overpronation of the hindfoot. Low back exam does have some loss of lordosis.  No significant tenderness around the coronal iliac joint left greater than  right.  Tenderness to palpation over the greater trochanteric area.  Osteopathic findings C6 flexed rotated and side bent left T3 extended rotated and side bent right inhaled third rib T9 extended rotated and side bent left L2 flexed rotated and side bent right Sacrum left on left     Impression and Recommendations:    The above documentation has been reviewed and is accurate and complete Judi Saa, DO

## 2023-10-27 ENCOUNTER — Encounter: Payer: Self-pay | Admitting: Family Medicine

## 2023-10-27 ENCOUNTER — Ambulatory Visit (INDEPENDENT_AMBULATORY_CARE_PROVIDER_SITE_OTHER): Admitting: Family Medicine

## 2023-10-27 VITALS — BP 130/90 | HR 102 | Ht 63.0 in

## 2023-10-27 DIAGNOSIS — M9908 Segmental and somatic dysfunction of rib cage: Secondary | ICD-10-CM | POA: Diagnosis not present

## 2023-10-27 DIAGNOSIS — M9904 Segmental and somatic dysfunction of sacral region: Secondary | ICD-10-CM | POA: Diagnosis not present

## 2023-10-27 DIAGNOSIS — M533 Sacrococcygeal disorders, not elsewhere classified: Secondary | ICD-10-CM | POA: Diagnosis not present

## 2023-10-27 DIAGNOSIS — R269 Unspecified abnormalities of gait and mobility: Secondary | ICD-10-CM | POA: Diagnosis not present

## 2023-10-27 DIAGNOSIS — M9903 Segmental and somatic dysfunction of lumbar region: Secondary | ICD-10-CM

## 2023-10-27 DIAGNOSIS — M9901 Segmental and somatic dysfunction of cervical region: Secondary | ICD-10-CM

## 2023-10-27 DIAGNOSIS — M9902 Segmental and somatic dysfunction of thoracic region: Secondary | ICD-10-CM

## 2023-10-27 NOTE — Assessment & Plan Note (Signed)
 Likely contributing to more of the disc pain.  Discussed with patient about icing regimen and home exercises.  Could potentially consider the possibility of repeating avoid.  Increase activity slowly otherwise.  Follow-up again in 6 to 8 weeks.

## 2023-10-27 NOTE — Patient Instructions (Addendum)
 Do prescribed exercises at least 3x a week Focus on breaths Made the changes to orthotics See you again at next appointment

## 2023-10-27 NOTE — Assessment & Plan Note (Signed)
 Made changes to the orthotics that patient is wearing.  Given some padded heel lifts that I think will be helpful.  Discussed icing regimen and home exercises otherwise.  Increase activity slowly.  Follow-up again in 6 to 8 weeks otherwise

## 2023-11-07 ENCOUNTER — Other Ambulatory Visit: Payer: Self-pay | Admitting: Internal Medicine

## 2023-11-07 ENCOUNTER — Other Ambulatory Visit: Payer: Self-pay | Admitting: Family Medicine

## 2023-11-16 ENCOUNTER — Ambulatory Visit: Admitting: Family Medicine

## 2023-12-07 NOTE — Progress Notes (Deleted)
  [facility] [address] [place] [address] Phone: [phone] Subjective:    I'm seeing this patient by the request  of:  Colene Dauphin, MD  CC:   [id number]  Jessica [name] is a 58 y.o. female coming in with complaint of back and neck pain. OMT on 10/27/2023. Patient states   Medications patient has been prescribed: gabapentin   Taking:         Reviewed prior external information including notes and imaging from previsou exam, outside providers and external EMR if available.   As well as notes that were available from care everywhere and other healthcare systems.  Past medical history, social, surgical and family history all reviewed in electronic medical record.  No pertanent information unless stated regarding to the chief complaint.   Past Medical History:  Diagnosis Date   ALLERGIC RHINITIS CAUSE UNSPECIFIED    ANXIETY DISORDER    Complication of anesthesia    CYSTIC ACNE    DEPRESSION    DYSPLASTIC NEVUS, CHEST    ELEVATED BP READING WITHOUT DX HYPERTENSION    GERD (gastroesophageal reflux disease)    INSOMNIA    Mixed hyperlipidemia    PONV (postoperative nausea and vomiting)    THROMBOCYTOSIS     No Known Allergies   Review of Systems:  No headache, visual changes, nausea, vomiting, diarrhea, constipation, dizziness, abdominal pain, skin rash, fevers, chills, night sweats, weight loss, swollen lymph nodes, body aches, joint swelling, chest pain, shortness of breath, mood changes. POSITIVE muscle aches  Objective  Last menstrual period 12/15/2011.   General: No apparent distress alert and oriented x3 mood and affect normal, dressed appropriately.  HEENT: Pupils equal, extraocular movements intact  Respiratory: Patient's speak in full sentences and does not appear short of breath  Cardiovascular: No lower extremity edema, non tender, no erythema  Gait MSK:  Back   Osteopathic findings  C2 flexed  rotated and side bent right C6 flexed rotated and side bent left T3 extended rotated and side bent right inhaled rib T9 extended rotated and side bent left L2 flexed rotated and side bent right Sacrum right on right       Assessment and Plan:  No problem-specific Assessment & Plan notes found for this encounter.    Nonallopathic problems  Decision today to treat with OMT was based on Physical Exam  After verbal consent patient was treated with HVLA, ME, FPR techniques in cervical, rib, thoracic, lumbar, and sacral  areas  Patient tolerated the procedure well with improvement in symptoms  Patient given exercises, stretches and lifestyle modifications  See medications in patient instructions if given  Patient will follow up in 4-8 weeks             Note: This dictation was prepared with Dragon dictation along with smaller phrase technology. Any transcriptional errors that result from this process are unintentional.

## 2023-12-08 ENCOUNTER — Ambulatory Visit: Admitting: Family Medicine

## 2024-02-14 ENCOUNTER — Encounter: Payer: Self-pay | Admitting: Internal Medicine

## 2024-04-10 ENCOUNTER — Telehealth: Payer: Self-pay

## 2024-04-10 DIAGNOSIS — R739 Hyperglycemia, unspecified: Secondary | ICD-10-CM

## 2024-04-10 DIAGNOSIS — E559 Vitamin D deficiency, unspecified: Secondary | ICD-10-CM

## 2024-04-10 DIAGNOSIS — E782 Mixed hyperlipidemia: Secondary | ICD-10-CM

## 2024-04-10 NOTE — Telephone Encounter (Signed)
 Copied from CRM [id number]. Topic: Clinical - Lab/Test Results >> Apr 10, 2024 10:14 AM [place] J wrote: Reason for CRM: Patient would like basic fasting labs prior to her physical in November. She would also like to know if she is a candidate for weight loss injectables and with that, she stated that checking her A1C would be ideal for her.

## 2024-04-10 NOTE — Telephone Encounter (Signed)
 Copied from CRM [id number]. Topic: General - Other >> Apr 10, 2024 10:12 AM [place] J wrote: Reason for CRM: Patient is wondering if we have new information on the new COVID vaccination that is coming out from the CDC this coming fall.

## 2024-04-11 NOTE — Telephone Encounter (Signed)
 Spoke with patient today.

## 2024-04-12 NOTE — Addendum Note (Signed)
 Addended by: GEOFM GLADE PARAS on: 04/12/2024 02:57 PM   Modules accepted: Orders

## 2024-04-12 NOTE — Telephone Encounter (Signed)
 Labs ordered.  Can discuss weight loss meds at appt

## 2024-04-12 NOTE — Telephone Encounter (Signed)
 Spoke with patient today.

## 2024-04-13 ENCOUNTER — Other Ambulatory Visit (INDEPENDENT_AMBULATORY_CARE_PROVIDER_SITE_OTHER)

## 2024-04-13 DIAGNOSIS — E559 Vitamin D deficiency, unspecified: Secondary | ICD-10-CM

## 2024-04-13 DIAGNOSIS — R739 Hyperglycemia, unspecified: Secondary | ICD-10-CM

## 2024-04-13 DIAGNOSIS — E782 Mixed hyperlipidemia: Secondary | ICD-10-CM | POA: Diagnosis not present

## 2024-04-13 LAB — COMPREHENSIVE METABOLIC PANEL WITH GFR
ALT: 18 U/L (ref 0–35)
AST: 15 U/L (ref 0–37)
Albumin: 3.6 g/dL (ref 3.5–5.2)
Alkaline Phosphatase: 184 U/L — ABNORMAL HIGH (ref 39–117)
BUN: 13 mg/dL (ref 6–23)
CO2: 27 meq/L (ref 19–32)
Calcium: 9.2 mg/dL (ref 8.4–10.5)
Chloride: 99 meq/L (ref 96–112)
Creatinine, Ser: 0.6 mg/dL (ref 0.40–1.20)
GFR: 100.67 mL/min (ref >60.00)
Glucose, Bld: 85 mg/dL (ref 70–99)
Potassium: 4.2 meq/L (ref 3.5–5.1)
Sodium: 136 meq/L (ref 135–145)
Total Bilirubin: 0.3 mg/dL (ref 0.2–1.2)
Total Protein: 7.3 g/dL (ref 6.0–8.3)

## 2024-04-13 LAB — CBC WITH DIFFERENTIAL/PLATELET
Basophils Absolute: 0.1 K/uL (ref 0.0–0.1)
Basophils Relative: 0.7 % (ref 0.0–3.0)
Eosinophils Absolute: 0.3 K/uL (ref 0.0–0.7)
Eosinophils Relative: 2.7 % (ref 0.0–5.0)
HCT: 38.9 % (ref 36.0–46.0)
Hemoglobin: 12.8 g/dL (ref 12.0–15.0)
Lymphocytes Relative: 24.4 % (ref 12.0–46.0)
Lymphs Abs: 2.9 K/uL (ref 0.7–4.0)
MCHC: 32.9 g/dL (ref 30.0–36.0)
MCV: 82.1 fl (ref 78.0–100.0)
Monocytes Absolute: 0.9 K/uL (ref 0.1–1.0)
Monocytes Relative: 7.3 % (ref 3.0–12.0)
Neutro Abs: 7.7 K/uL (ref 1.4–7.7)
Neutrophils Relative %: 64.9 % (ref 43.0–77.0)
Platelets: 498 K/uL — ABNORMAL HIGH (ref 150.0–400.0)
RBC: 4.74 Mil/uL (ref 3.87–5.11)
RDW: 13.4 % (ref 11.5–15.5)
WBC: 11.9 K/uL — ABNORMAL HIGH (ref 4.0–10.5)

## 2024-04-13 LAB — VITAMIN D 25 HYDROXY (VIT D DEFICIENCY, FRACTURES): VITD: 80.51 ng/mL (ref 30.00–100.00)

## 2024-04-13 LAB — TSH: TSH: 2.88 u[IU]/mL (ref 0.35–5.50)

## 2024-04-13 LAB — LIPID PANEL
Cholesterol: 257 mg/dL — ABNORMAL HIGH (ref 0–200)
HDL: 88.9 mg/dL (ref >39.00)
LDL Cholesterol: 108 mg/dL — ABNORMAL HIGH (ref 0–99)
NonHDL: 168.11
Total CHOL/HDL Ratio: 3
Triglycerides: 299 mg/dL — ABNORMAL HIGH (ref 0.0–149.0)
VLDL: 59.8 mg/dL — ABNORMAL HIGH (ref 0.0–40.0)

## 2024-04-13 LAB — HEMOGLOBIN A1C: Hgb A1c MFr Bld: 6 % (ref 4.6–6.5)

## 2024-04-14 ENCOUNTER — Ambulatory Visit: Payer: Self-pay | Admitting: Internal Medicine

## 2024-04-16 ENCOUNTER — Encounter: Payer: Self-pay | Admitting: Internal Medicine

## 2024-04-16 NOTE — Patient Instructions (Addendum)

## 2024-04-16 NOTE — Progress Notes (Unsigned)
 Subjective:    Patient ID: Jessica [name], female    DOB: Dec 15, 1965, 58 y.o.   MRN: [id number]      HPI Jessica [name] is here for a Physical exam and her chronic medical problems.   Had labs.  Prediabetes is new   Having increased anxiety and stress.    Concerned about weight.    Medications and allergies reviewed with patient and updated if appropriate.  Current Outpatient Medications on File Prior to Visit  Medication Sig Dispense Refill   augmented betamethasone  dipropionate (DIPROLENE -AF) 0.05 % cream Apply topically 2 (two) times daily as needed. 150 g 1   betamethasone  dipropionate 0.05 % cream APPLY TOPICALLY TWICE DAILY AS  NEEDED FOR ECZEMA 90 g 2   buPROPion  (WELLBUTRIN  XL) 300 MG 24 hr tablet Take 1 tablet (300 mg total) by mouth daily. 14 tablet 0   cetirizine (ZYRTEC) 10 MG tablet Take 10 mg by mouth as needed.     cyclobenzaprine  (FLEXERIL ) 5 MG tablet Take 1 tablet (5 mg total) by mouth 3 (three) times daily as needed for muscle spasms. 30 tablet 0   drospirenone -ethinyl estradiol  (YAZ) 3-0.02 MG per tablet Take 1 tablet by mouth daily.     fexofenadine (ALLEGRA) 180 MG tablet Take 180 mg by mouth as needed.     gabapentin  (NEURONTIN ) 100 MG capsule TAKE 2 CAPSULES BY MOUTH 3 TIMES DAILY 540 capsule 3   KLONOPIN  0.5 MG tablet Take 1 tablet by mouth twice a day as needed TKE 1/2-1 AS NEEDED     montelukast  (SINGULAIR ) 10 MG tablet TAKE 1 TABLET BY MOUTH AT  BEDTIME 90 tablet 3   olopatadine  (PATANOL) 0.1 % ophthalmic solution INSTILL 1 DROP INTO BOTH  EYES TWICE DAILY. 15 mL 5   omeprazole  (PRILOSEC) 20 MG capsule TAKE 1 CAPSULE BY MOUTH DAILY 90 capsule 3   promethazine -dextromethorphan (PROMETHAZINE -DM) 6.25-15 MG/5ML syrup Take 5 mLs by mouth 4 (four) times daily as needed. 150 mL 0   pseudoephedrine  (SUDAFED) 120 MG 12 hr tablet Take 1 tablet (120 mg total) by mouth 2 (two) times daily as needed for congestion. 90 tablet 0   QUEtiapine (SEROQUEL) 25 MG tablet  Take 25 mg by mouth at bedtime.     simvastatin  (ZOCOR ) 20 MG tablet TAKE 1 TABLET BY MOUTH AT  BEDTIME 90 tablet 3   VITAMIN D3 1.25 MG (50000 UT) capsule Take by mouth.     No current facility-administered medications on file prior to visit.    Review of Systems  Constitutional:  Negative for fever.  Eyes:  Negative for visual disturbance.  Respiratory:  Negative for cough, shortness of breath and wheezing.   Cardiovascular:  Negative for chest pain, palpitations and leg swelling.  Gastrointestinal:  Negative for abdominal pain, blood in stool, constipation and diarrhea.       No gerd - controlled  Genitourinary:  Negative for dysuria.  Musculoskeletal:  Positive for back pain (chronic). Negative for arthralgias.  Skin:  Positive for rash (eczema).  Neurological:  Positive for headaches (occ). Negative for light-headedness.  Psychiatric/Behavioral:  Positive for dysphoric mood and sleep disturbance (controlled). The patient is nervous/anxious.        Objective:   Vitals:   04/17/24 1322  BP: 124/70  Pulse: 90  Temp: 98.1 F (36.7 C)  SpO2: 99%   Filed Weights   04/17/24 1322  Weight: 192 lb (87.1 kg)   Body mass index is 34.01 kg/m.  BP Readings from  Last 3 Encounters:  04/17/24 124/70  10/27/23 (!) 130/90  07/15/23 124/88    Wt Readings from Last 3 Encounters:  04/17/24 192 lb (87.1 kg)  07/15/23 193 lb (87.5 kg)  01/27/23 198 lb (89.8 kg)       Physical Exam Constitutional: She appears well-developed and well-nourished. No distress.  HENT:  Head: Normocephalic and atraumatic.  Right Ear: External ear normal. Normal ear canal and TM Left Ear: External ear normal.  Normal ear canal and TM Mouth/Throat: Oropharynx is clear and moist.  Eyes: Conjunctivae normal.  Neck: Neck supple. No tracheal deviation present. No thyromegaly present.  No carotid bruit  Cardiovascular: Normal rate, regular rhythm and normal heart sounds.   No murmur heard.  No  edema. Pulmonary/Chest: Effort normal and breath sounds normal. No respiratory distress. She has no wheezes. She has no rales.  Breast: deferred   Abdominal: Soft. She exhibits no distension. There is no tenderness.  Lymphadenopathy: She has no cervical adenopathy.  Skin: Skin is warm and dry. She is not diaphoretic.  Psychiatric: She has a normal mood and affect. Her behavior is normal.     Lab Results  Component Value Date   WBC 11.9 (H) 04/13/2024   HGB 12.8 04/13/2024   HCT 38.9 04/13/2024   PLT 498.0 (H) 04/13/2024   GLUCOSE 85 04/13/2024   CHOL 257 (H) 04/13/2024   TRIG 299.0 (H) 04/13/2024   HDL 88.90 04/13/2024   LDLDIRECT 96.0 11/11/2021   LDLCALC 108 (H) 04/13/2024   ALT 18 04/13/2024   AST 15 04/13/2024   NA 136 04/13/2024   K 4.2 04/13/2024   CL 99 04/13/2024   CREATININE 0.60 04/13/2024   BUN 13 04/13/2024   CO2 27 04/13/2024   TSH 2.88 04/13/2024   HGBA1C 6.0 04/13/2024         Assessment & Plan:   Physical exam: Screening blood work  reviewed Exercise  gym 1-2 times a week, walking - will walk more since it is cooler Weight  obese Substance abuse  none   Reviewed recommended immunizations.   Health Maintenance  Topic Date Due   Hepatitis B Vaccines 19-59 Average Risk (1 of 3 - 19+ 3-dose series) Never done   Pneumococcal Vaccine: 50+ Years (1 of 1 - PCV) Never done   MAMMOGRAM  06/14/2019   Cervical Cancer Screening (HPV/Pap Cotest)  06/06/2021   Zoster Vaccines- Shingrix  (2 of 2) 01/26/2024   Influenza Vaccine  11/06/2024 (Originally 03/09/2024)   COVID-19 Vaccine (6 - Pfizer risk 2024-25 season) 06/01/2024   Fecal DNA (Cologuard)  07/10/2026   DTaP/Tdap/Td (3 - Td or Tdap) 06/06/2029   HIV Screening  Completed   HPV VACCINES  Aged Out   Meningococcal B Vaccine  Aged Out   Hepatitis C Screening  Discontinued          See Problem List for Assessment and Plan of chronic medical problems.

## 2024-04-17 ENCOUNTER — Ambulatory Visit (INDEPENDENT_AMBULATORY_CARE_PROVIDER_SITE_OTHER): Admitting: Internal Medicine

## 2024-04-17 VITALS — BP 124/70 | HR 90 | Temp 98.1°F | Ht 63.0 in | Wt 192.0 lb

## 2024-04-17 DIAGNOSIS — Z6834 Body mass index (BMI) 34.0-34.9, adult: Secondary | ICD-10-CM

## 2024-04-17 DIAGNOSIS — Z23 Encounter for immunization: Secondary | ICD-10-CM

## 2024-04-17 DIAGNOSIS — E559 Vitamin D deficiency, unspecified: Secondary | ICD-10-CM

## 2024-04-17 DIAGNOSIS — E782 Mixed hyperlipidemia: Secondary | ICD-10-CM

## 2024-04-17 DIAGNOSIS — Z0000 Encounter for general adult medical examination without abnormal findings: Secondary | ICD-10-CM

## 2024-04-17 DIAGNOSIS — R7303 Prediabetes: Secondary | ICD-10-CM

## 2024-04-17 DIAGNOSIS — F411 Generalized anxiety disorder: Secondary | ICD-10-CM

## 2024-04-17 DIAGNOSIS — E669 Obesity, unspecified: Secondary | ICD-10-CM | POA: Diagnosis not present

## 2024-04-17 DIAGNOSIS — K219 Gastro-esophageal reflux disease without esophagitis: Secondary | ICD-10-CM | POA: Diagnosis not present

## 2024-04-17 DIAGNOSIS — E785 Hyperlipidemia, unspecified: Secondary | ICD-10-CM | POA: Insufficient documentation

## 2024-04-17 DIAGNOSIS — F32A Depression, unspecified: Secondary | ICD-10-CM

## 2024-04-17 MED ORDER — BETAMETHASONE DIPROPIONATE 0.05 % EX CREA: TOPICAL_CREAM | Freq: Two times a day (BID) | CUTANEOUS | 2 refills | Status: AC

## 2024-04-17 NOTE — Assessment & Plan Note (Signed)
 New Lab Results  Component Value Date   HGBA1C 6.0 04/13/2024   Low sugar/carbohydrate diet Increase exercise Work on weight loss

## 2024-04-17 NOTE — Assessment & Plan Note (Addendum)
 Chronic BMI 34.01 Comorbidities hyperlipidemia, prediabetes, depression, anxiety, GERD She is exercising but can increase the amount she exercises-she will try to increase her walking a bit the weather is cooler Stress level is high which is not helping She is interested in considering a low-dose GLP 1 agonist to see if that will help decrease her appetite She wants to lose weight slowly and knows that it is good to take in more effort with lifestyle changes, but really feel she needs assistance Discussed Zepbound or Marceline will think about this and let me know next month or so after she returns from her trip

## 2024-04-17 NOTE — Assessment & Plan Note (Signed)
 Chronic Reviewed lipid panel Regular exercise and healthy diet encouraged Discussed weight loss she does want to lose weight loss and we did discuss medications that may help that Continue simvastatin  20 mg at bedtime

## 2024-04-17 NOTE — Assessment & Plan Note (Signed)
 Chronic Increased recently Seeing a therapist Medication management per psychiatric nurse practitioner She will be able to increase her exercise some which will likely help

## 2024-04-17 NOTE — Assessment & Plan Note (Signed)
Chronic ?Management per psychiatric nurse practitioner ?

## 2024-04-17 NOTE — Assessment & Plan Note (Signed)
 Chronic Taking vitamin D  weekly-okay to switch to daily vitamin D 

## 2024-04-17 NOTE — Assessment & Plan Note (Signed)
Chronic GERD controlled Continue omeprazole 20 mg every other day  

## 2024-05-02 ENCOUNTER — Encounter: Payer: Self-pay | Admitting: Internal Medicine

## 2024-05-04 ENCOUNTER — Encounter: Payer: Self-pay | Admitting: Internal Medicine

## 2024-05-04 ENCOUNTER — Telehealth: Payer: Self-pay

## 2024-05-04 ENCOUNTER — Ambulatory Visit: Admitting: Internal Medicine

## 2024-05-04 VITALS — BP 134/90 | HR 95 | Temp 98.5°F | Ht 63.0 in | Wt 194.0 lb

## 2024-05-04 DIAGNOSIS — J22 Unspecified acute lower respiratory infection: Secondary | ICD-10-CM

## 2024-05-04 MED ORDER — GUAIFENESIN-CODEINE 100-10 MG/5ML PO SOLN: 5.0000 mL | Freq: Three times a day (TID) | ORAL | 0 refills | Status: DC | PRN

## 2024-05-04 MED ORDER — AZITHROMYCIN 250 MG PO TABS: ORAL_TABLET | ORAL | 0 refills | Status: DC

## 2024-05-04 NOTE — Progress Notes (Signed)
 Subjective:    Patient ID: Jessica [name], female    DOB: 14-Mar-1966, 58 y.o.   MRN: [id number]      HPI Jessica [name] is here for  Chief Complaint  Patient presents with   Cough    Cough x 2 weeks; ribs are sore from coughing and feels like cough is settling in chest    Discussed the use of AI scribe software for clinical note transcription with the patient, who gave verbal consent to proceed.  History of Present Illness Jessica [name] is a 58 year old female who presents with a persistent cough and congestion.  She has been experiencing symptoms of a regular cold that began approximately two weeks ago. Initially, the symptoms started in the sinuses with postnasal drip and congestion which have moved down to her chest. The main issue now is a persistent cough, which has been present for four to five days.  Talking irritates her throat, and she is concerned about the cough persisting as she is scheduled to travel to [place] in a week. No fever, chills, ear pain, or ear pressure, though she mentions a slight sensation of ear clogging. She reports a mild sore throat and states that the sinus pressure has mostly resolved, with the symptoms now more focused in her chest. She experiences difficulty bringing up phlegm.  No shortness of breath, wheezing, chest pain, headaches, lightheadedness, dizziness, body aches, or nausea. Her symptoms are primarily localized from her throat to her chest.  For symptom management, she has been taking Robitussin DM and previously used guaifenesin  with codeine , which she found effective but sedating.   She is drinking plenty of fluids.        Medications and allergies reviewed with patient and updated if appropriate.  Current Outpatient Medications on File Prior to Visit  Medication Sig Dispense Refill   augmented betamethasone  dipropionate (DIPROLENE -AF) 0.05 % cream Apply topically 2 (two) times daily as needed. 150 g 1   betamethasone   dipropionate 0.05 % cream Apply topically 2 (two) times daily. 90 g 2   buPROPion  (WELLBUTRIN  XL) 300 MG 24 hr tablet Take 1 tablet (300 mg total) by mouth daily. 14 tablet 0   cetirizine (ZYRTEC) 10 MG tablet Take 10 mg by mouth as needed.     cyclobenzaprine  (FLEXERIL ) 5 MG tablet Take 1 tablet (5 mg total) by mouth 3 (three) times daily as needed for muscle spasms. 30 tablet 0   drospirenone -ethinyl estradiol  (YAZ) 3-0.02 MG per tablet Take 1 tablet by mouth daily.     fexofenadine (ALLEGRA) 180 MG tablet Take 180 mg by mouth as needed.     gabapentin  (NEURONTIN ) 100 MG capsule TAKE 2 CAPSULES BY MOUTH 3 TIMES DAILY 540 capsule 3   KLONOPIN  0.5 MG tablet Take 1 tablet by mouth twice a day as needed TKE 1/2-1 AS NEEDED     montelukast  (SINGULAIR ) 10 MG tablet TAKE 1 TABLET BY MOUTH AT  BEDTIME 90 tablet 3   olopatadine  (PATANOL) 0.1 % ophthalmic solution INSTILL 1 DROP INTO BOTH  EYES TWICE DAILY. 15 mL 5   omeprazole  (PRILOSEC) 20 MG capsule TAKE 1 CAPSULE BY MOUTH DAILY 90 capsule 3   promethazine -dextromethorphan (PROMETHAZINE -DM) 6.25-15 MG/5ML syrup Take 5 mLs by mouth 4 (four) times daily as needed. 150 mL 0   pseudoephedrine  (SUDAFED) 120 MG 12 hr tablet Take 1 tablet (120 mg total) by mouth 2 (two) times daily as needed for congestion. 90 tablet 0  QUEtiapine (SEROQUEL) 25 MG tablet Take 25 mg by mouth at bedtime.     simvastatin  (ZOCOR ) 20 MG tablet TAKE 1 TABLET BY MOUTH AT  BEDTIME 90 tablet 3   No current facility-administered medications on file prior to visit.    Review of Systems  Constitutional:  Negative for chills and fever.  HENT:  Positive for congestion, postnasal drip, sinus pressure (minimal) and sore throat. Negative for ear pain.   Respiratory:  Positive for cough (productive at times). Negative for chest tightness, shortness of breath and wheezing.   Cardiovascular:  Negative for chest pain.  Gastrointestinal:  Negative for nausea.  Musculoskeletal:  Negative for  myalgias.  Neurological:  Negative for dizziness, light-headedness and headaches.       Objective:   Vitals:   05/04/24 1045  BP: (!) 134/90  Pulse: 95  Temp: 98.5 F (36.9 C)  SpO2: 98%   BP Readings from Last 3 Encounters:  05/04/24 (!) 134/90  04/17/24 124/70  10/27/23 (!) 130/90   Wt Readings from Last 3 Encounters:  05/04/24 194 lb (88 kg)  04/17/24 192 lb (87.1 kg)  07/15/23 193 lb (87.5 kg)   Body mass index is 34.37 kg/m.    Physical Exam Constitutional:      General: She is not in acute distress.    Appearance: Normal appearance. She is not ill-appearing.  HENT:     Head: Normocephalic and atraumatic.     Right Ear: Tympanic membrane, ear canal and external ear normal.     Left Ear: Tympanic membrane, ear canal and external ear normal.     Mouth/Throat:     Mouth: Mucous membranes are moist.     Pharynx: No oropharyngeal exudate or posterior oropharyngeal erythema.  Eyes:     Conjunctiva/sclera: Conjunctivae normal.  Cardiovascular:     Rate and Rhythm: Normal rate and regular rhythm.  Pulmonary:     Effort: Pulmonary effort is normal. No respiratory distress.     Breath sounds: Normal breath sounds. No wheezing or rales.  Musculoskeletal:     Cervical back: Neck supple. No tenderness.  Lymphadenopathy:     Cervical: No cervical adenopathy.  Skin:    General: Skin is warm and dry.  Neurological:     Mental Status: She is alert.            Assessment & Plan:    See Problem List for Assessment and Plan of chronic medical problems.

## 2024-05-04 NOTE — Assessment & Plan Note (Signed)
 Acute Symptoms present for about 2 weeks and have moved into her chest.  She is having a persistent dry cough.  Concerns for possible bacterial infection Continue over-the-counter cold medications, increase fluid and rest Start Z-Pak Guaifenesin  with codeine  cough syrup-she is aware this will cause some sedation Expect her symptoms to improve over the next several days and she should be fine to travel.  She will let me know if there is any concerns

## 2024-05-04 NOTE — Telephone Encounter (Signed)
 sent

## 2024-05-04 NOTE — Patient Instructions (Addendum)
       Medications changes include :   zpak cough syrup      Return if symptoms worsen or fail to improve.

## 2024-05-29 NOTE — Progress Notes (Unsigned)
 [name] [address] [place] [address] Phone: [phone] Subjective:   ISusannah [name], am serving as a scribe for Dr. Arthea Claudene.  I'm seeing this patient by the request  of:  Geofm Glade PARAS, MD  CC: Back and neck pain follow-up  [name]  Jessica [name] is a 58 y.o. female coming in with complaint of back and neck pain. OMT on 10/27/2023. Patient states mainly her for MSK. Just overall update.  Medications patient has been prescribed: Gabapentin   Taking:         Reviewed prior external information including notes and imaging from previsou exam, outside providers and external EMR if available.   As well as notes that were available from care everywhere and other healthcare systems.  Past medical history, social, surgical and family history all reviewed in electronic medical record.  No pertanent information unless stated regarding to the chief complaint.   Past Medical History:  Diagnosis Date   ALLERGIC RHINITIS CAUSE UNSPECIFIED    ANXIETY DISORDER    Complication of anesthesia    CYSTIC ACNE    DEPRESSION    DYSPLASTIC NEVUS, CHEST    ELEVATED BP READING WITHOUT DX HYPERTENSION    GERD (gastroesophageal reflux disease)    INSOMNIA    Mixed hyperlipidemia    PONV (postoperative nausea and vomiting)    THROMBOCYTOSIS     No Known Allergies   Review of Systems:  No headache, visual changes, nausea, vomiting, diarrhea, constipation, dizziness, abdominal pain, skin rash, fevers, chills, night sweats, weight loss, swollen lymph nodes, body aches, joint swelling, chest pain, shortness of breath, mood changes. POSITIVE muscle aches  Objective  Blood pressure (!) 160/88, pulse 95, height 5' 3 (1.6 m), weight 193 lb (87.5 kg), last menstrual period 12/15/2011, SpO2 98%.   General: No apparent distress alert and oriented x3 mood and affect normal, dressed appropriately.  HEENT: Pupils equal, extraocular  movements intact  Respiratory: Patient's speak in full sentences and does not appear short of breath  Cardiovascular: No lower extremity edema, non tender, no erythema  Gait MSK:  Back does have some loss lordosis noted.  Some tenderness to palpation in the paraspinal musculature.  Tightness with FABER test noted.  Osteopathic findings  C2 flexed rotated and side bent right C7 flexed rotated and side bent left T3 extended rotated and side bent right inhaled rib T9 extended rotated and side bent left L2 flexed rotated and side bent right Sacrum right on right     Assessment and Plan:  Chronic low back pain Low back pain does have some loss lordosis noted.  Tenderness to palpation noted.  Discussed icing regimen and home exercises, increase activity slowly discussed which activities to do and which ones to avoid.  Increase activity slowly.  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  No need for any type of other an injection at the moment.  Hyperlipidemia Hyperlipidemia, hypercholesterolemia, patient likely has fatty liver, discussed discussing this with patient and may be a candidate for GLP-1.  Follow-up again in 2 months otherwise.    Nonallopathic problems  Decision today to treat with OMT was based on Physical Exam  After verbal consent patient was treated with HVLA, ME, FPR techniques in cervical, rib, thoracic, lumbar, and sacral  areas  Patient tolerated the procedure well with improvement in symptoms  Patient given exercises, stretches and lifestyle modifications  See medications in patient instructions if given  Patient will follow up in 4-8 weeks     The above documentation has been reviewed and is accurate and complete Christion Leonhard M Jedadiah Abdallah, DO         Note: This dictation was prepared with Dragon dictation along with smaller phrase technology. Any transcriptional errors that result from this  process are unintentional.

## 2024-05-31 ENCOUNTER — Encounter: Payer: Self-pay | Admitting: Family Medicine

## 2024-05-31 ENCOUNTER — Ambulatory Visit: Admitting: Family Medicine

## 2024-05-31 VITALS — BP 160/88 | HR 95 | Ht 63.0 in | Wt 193.0 lb

## 2024-05-31 DIAGNOSIS — M9908 Segmental and somatic dysfunction of rib cage: Secondary | ICD-10-CM

## 2024-05-31 DIAGNOSIS — M9904 Segmental and somatic dysfunction of sacral region: Secondary | ICD-10-CM

## 2024-05-31 DIAGNOSIS — E7800 Pure hypercholesterolemia, unspecified: Secondary | ICD-10-CM | POA: Diagnosis not present

## 2024-05-31 DIAGNOSIS — M9902 Segmental and somatic dysfunction of thoracic region: Secondary | ICD-10-CM | POA: Diagnosis not present

## 2024-05-31 DIAGNOSIS — E78019 Familial hypercholesterolemia, unspecified: Secondary | ICD-10-CM | POA: Diagnosis not present

## 2024-05-31 DIAGNOSIS — M9901 Segmental and somatic dysfunction of cervical region: Secondary | ICD-10-CM

## 2024-05-31 DIAGNOSIS — E669 Obesity, unspecified: Secondary | ICD-10-CM

## 2024-05-31 DIAGNOSIS — M5442 Lumbago with sciatica, left side: Secondary | ICD-10-CM

## 2024-05-31 DIAGNOSIS — M533 Sacrococcygeal disorders, not elsewhere classified: Secondary | ICD-10-CM

## 2024-05-31 DIAGNOSIS — G8929 Other chronic pain: Secondary | ICD-10-CM | POA: Diagnosis not present

## 2024-05-31 DIAGNOSIS — M9903 Segmental and somatic dysfunction of lumbar region: Secondary | ICD-10-CM

## 2024-05-31 NOTE — Assessment & Plan Note (Signed)
 Chronic but stable at the moment.  No need for any type of other an injection at the moment.

## 2024-05-31 NOTE — Assessment & Plan Note (Signed)
 Low back pain does have some loss lordosis noted.  Tenderness to palpation noted.  Discussed icing regimen and home exercises, increase activity slowly discussed which activities to do and which ones to avoid.  Increase activity slowly.  Follow-up again in 6 to 8 weeks

## 2024-05-31 NOTE — Assessment & Plan Note (Signed)
 Is awake.

## 2024-05-31 NOTE — Assessment & Plan Note (Signed)
 Hyperlipidemia, hypercholesterolemia, patient likely has fatty liver, discussed discussing this with patient and may be a candidate for GLP-1.  Follow-up again in 2 months otherwise.

## 2024-05-31 NOTE — Patient Instructions (Addendum)
 Referral to nutrition CT Cardiac Calcium  Scoring Ask Dr. Geofm about liver US  for new approval of zepbound See you again in 2-3 months

## 2024-06-01 ENCOUNTER — Encounter: Payer: Self-pay | Admitting: Internal Medicine

## 2024-06-06 NOTE — Progress Notes (Unsigned)
    Subjective:    Patient ID: Jessica [name], female    DOB: 06/15/1966, 58 y.o.   MRN: [id number]      HPI [name] is here for No chief complaint on file.   Here to discuss GLP-1 medications.         Medications and allergies reviewed with patient and updated if appropriate.  Current Outpatient Medications on File Prior to Visit  Medication Sig Dispense Refill   augmented betamethasone  dipropionate (DIPROLENE -AF) 0.05 % cream Apply topically 2 (two) times daily as needed. 150 g 1   azithromycin  (ZITHROMAX ) 250 MG tablet Take two tabs the first day and then one tab daily for four days 6 tablet 0   betamethasone  dipropionate 0.05 % cream Apply topically 2 (two) times daily. 90 g 2   buPROPion  (WELLBUTRIN  XL) 300 MG 24 hr tablet Take 1 tablet (300 mg total) by mouth daily. 14 tablet 0   cetirizine (ZYRTEC) 10 MG tablet Take 10 mg by mouth as needed.     cyclobenzaprine  (FLEXERIL ) 5 MG tablet Take 1 tablet (5 mg total) by mouth 3 (three) times daily as needed for muscle spasms. 30 tablet 0   drospirenone -ethinyl estradiol  (YAZ) 3-0.02 MG per tablet Take 1 tablet by mouth daily.     fexofenadine (ALLEGRA) 180 MG tablet Take 180 mg by mouth as needed.     gabapentin  (NEURONTIN ) 100 MG capsule TAKE 2 CAPSULES BY MOUTH 3 TIMES DAILY 540 capsule 3   guaiFENesin -codeine  100-10 MG/5ML syrup Take 5 mLs by mouth 3 (three) times daily as needed for cough. 120 mL 0   KLONOPIN  0.5 MG tablet Take 1 tablet by mouth twice a day as needed TKE 1/2-1 AS NEEDED     montelukast  (SINGULAIR ) 10 MG tablet TAKE 1 TABLET BY MOUTH AT  BEDTIME 90 tablet 3   olopatadine  (PATANOL) 0.1 % ophthalmic solution INSTILL 1 DROP INTO BOTH  EYES TWICE DAILY. 15 mL 5   omeprazole  (PRILOSEC) 20 MG capsule TAKE 1 CAPSULE BY MOUTH DAILY 90 capsule 3   promethazine -dextromethorphan (PROMETHAZINE -DM) 6.25-15 MG/5ML syrup Take 5 mLs by mouth 4 (four) times daily as needed. 150 mL 0   pseudoephedrine  (SUDAFED) 120 MG  12 hr tablet Take 1 tablet (120 mg total) by mouth 2 (two) times daily as needed for congestion. 90 tablet 0   QUEtiapine (SEROQUEL) 25 MG tablet Take 25 mg by mouth at bedtime.     simvastatin  (ZOCOR ) 20 MG tablet TAKE 1 TABLET BY MOUTH AT  BEDTIME 90 tablet 3   No current facility-administered medications on file prior to visit.    Review of Systems     Objective:  There were no vitals filed for this visit. BP Readings from Last 3 Encounters:  05/31/24 (!) 160/88  05/04/24 (!) 134/90  04/17/24 124/70   Wt Readings from Last 3 Encounters:  05/31/24 193 lb (87.5 kg)  05/04/24 194 lb (88 kg)  04/17/24 192 lb (87.1 kg)   There is no height or weight on file to calculate BMI.    Physical Exam         Assessment & Plan:    See Problem List for Assessment and Plan of chronic medical problems.

## 2024-06-07 ENCOUNTER — Encounter: Payer: Self-pay | Admitting: Internal Medicine

## 2024-06-07 ENCOUNTER — Ambulatory Visit: Admitting: Internal Medicine

## 2024-06-07 VITALS — BP 140/90 | HR 72 | Temp 98.2°F | Ht 63.0 in | Wt 192.0 lb

## 2024-06-07 DIAGNOSIS — R748 Abnormal levels of other serum enzymes: Secondary | ICD-10-CM | POA: Diagnosis not present

## 2024-06-07 DIAGNOSIS — E669 Obesity, unspecified: Secondary | ICD-10-CM | POA: Diagnosis not present

## 2024-06-07 DIAGNOSIS — R7303 Prediabetes: Secondary | ICD-10-CM | POA: Diagnosis not present

## 2024-06-07 DIAGNOSIS — R030 Elevated blood-pressure reading, without diagnosis of hypertension: Secondary | ICD-10-CM | POA: Diagnosis not present

## 2024-06-07 MED ORDER — ZEPBOUND 2.5 MG/0.5ML ~~LOC~~ SOAJ: 2.5000 mg | SUBCUTANEOUS | 0 refills | Status: DC

## 2024-06-07 NOTE — Assessment & Plan Note (Signed)
 Has had several elevated blood pressure readings Discussed concerning consequences of uncontrolled hypertension We both agree that weight loss will likely improve her blood pressure She would like to hold off on starting medication and see if she continues with that the next 2 months Monitor blood pressure closely

## 2024-06-07 NOTE — Patient Instructions (Addendum)
     Medications changes include :   Zepbound 2.5 mg weekly    An Abdominal ultrasound was ordered and someone will call you to schedule an appointment.

## 2024-06-07 NOTE — Assessment & Plan Note (Signed)
 Chronic BMI 34.01 Comorbidities hyperlipidemia, prediabetes, depression, anxiety, GERD She is exercising regularly - walking 3 times a week and Zumba classes 3 times a week She could eat better.  Discussed increasing protein, fiber and vegetables.  Advised not overdoing fruits and avoiding many carbohydrates and avoid sugars.  Try not to drink your calories. Discussed that lifestyle changes and I think that we will look long-term Weight loss would benefit her in multiple ways including including anxiety, depression, GERD, lower cholesterol and help prevent diabetes High risk for fatty liver-Will get an ultrasound of her abdomen.  Also getting a CT CAC Start Zepbound 2.5 mg weekly

## 2024-06-07 NOTE — Assessment & Plan Note (Signed)
 Chronic Lab Results  Component Value Date   HGBA1C 6.0 04/13/2024   Low sugar/carbohydrate diet Increase exercise Work on weight loss

## 2024-06-15 ENCOUNTER — Ambulatory Visit (HOSPITAL_COMMUNITY)
Admission: RE | Admit: 2024-06-15 | Discharge: 2024-06-15 | Disposition: A | Discharge status: Home / Self Care | Payer: Self-pay | Source: Ambulatory Visit | Attending: Internal Medicine | Admitting: Internal Medicine

## 2024-06-15 ENCOUNTER — Ambulatory Visit
Admission: RE | Admit: 2024-06-15 | Discharge: 2024-06-15 | Disposition: A | Discharge status: Home / Self Care | Source: Ambulatory Visit | Attending: Internal Medicine | Admitting: Internal Medicine

## 2024-06-15 ENCOUNTER — Other Ambulatory Visit

## 2024-06-15 ENCOUNTER — Encounter: Payer: Self-pay | Admitting: Internal Medicine

## 2024-06-15 DIAGNOSIS — I2510 Atherosclerotic heart disease of native coronary artery without angina pectoris: Secondary | ICD-10-CM

## 2024-06-15 DIAGNOSIS — R748 Abnormal levels of other serum enzymes: Secondary | ICD-10-CM

## 2024-06-15 DIAGNOSIS — E669 Obesity, unspecified: Secondary | ICD-10-CM

## 2024-06-15 DIAGNOSIS — E7800 Pure hypercholesterolemia, unspecified: Secondary | ICD-10-CM

## 2024-06-16 ENCOUNTER — Encounter: Payer: Self-pay | Admitting: Internal Medicine

## 2024-06-16 ENCOUNTER — Ambulatory Visit: Payer: Self-pay | Admitting: Internal Medicine

## 2024-06-16 DIAGNOSIS — K760 Fatty (change of) liver, not elsewhere classified: Secondary | ICD-10-CM | POA: Insufficient documentation

## 2024-06-18 ENCOUNTER — Ambulatory Visit: Payer: Self-pay | Admitting: Family Medicine

## 2024-06-21 ENCOUNTER — Other Ambulatory Visit: Payer: Self-pay

## 2024-06-21 DIAGNOSIS — I2510 Atherosclerotic heart disease of native coronary artery without angina pectoris: Secondary | ICD-10-CM | POA: Insufficient documentation

## 2024-06-21 MED ORDER — WEGOVY 0.25 MG/0.5ML ~~LOC~~ SOAJ: 0.2500 mg | SUBCUTANEOUS | 0 refills | Status: DC

## 2024-06-21 MED ORDER — WEGOVY 0.25 MG/0.5ML ~~LOC~~ SOAJ: 0.2500 mg | SUBCUTANEOUS | Status: DC

## 2024-06-21 NOTE — Addendum Note (Signed)
 Addended by: GEOFM GLADE PARAS on: 06/21/2024 07:50 AM   Modules accepted: Orders

## 2024-06-22 ENCOUNTER — Encounter: Admitting: Internal Medicine

## 2024-06-29 ENCOUNTER — Other Ambulatory Visit (HOSPITAL_COMMUNITY): Payer: Self-pay

## 2024-06-29 ENCOUNTER — Telehealth: Payer: Self-pay

## 2024-06-29 NOTE — Telephone Encounter (Signed)
 Pharmacy Patient Advocate Encounter   Received notification from Patient Advice Request messages that prior authorization for Wegovy  0.25mg /0.39ml is required/requested.   Insurance verification completed.   The patient is insured through [facility].   Per test claim: Per test claim, medication is not covered due to plan/benefit exclusion, PA not submitted at this time  Wegovy  not covered for Weight Loss - may be covered for cardiovascular reduction with a PA

## 2024-08-23 ENCOUNTER — Encounter: Payer: Self-pay | Admitting: Internal Medicine

## 2024-08-25 MED ORDER — WEGOVY 1.5 MG PO TABS: 1.5000 mg | ORAL_TABLET | Freq: Every day | ORAL | 0 refills | Status: AC

## 2024-09-04 NOTE — Progress Notes (Unsigned)
 " [name] [address] [place] [address] Phone: [phone] Subjective:   Jessica [name] Jessica [name], am serving as a scribe for Dr. Arthea [name].  I'm seeing this patient by the request  of:  Jessica [name] PARAS, MD  CC: Right hip pain  [name]  Jessica [name] is a 59 y.o. female coming in with complaint of back and neck pain. OMT 05/31/2024. Patient states that she has been having R hip pain over the GT for the past 2 weeks. Pain is intermittent Painful to extend her hip and is unable to sleep on the R side. Notes that she started HRT a few months ago.   Medications patient has been prescribed: None  Taking:         Reviewed prior external information including notes and imaging from previsou exam, outside providers and external EMR if available.   As well as notes that were available from care everywhere and other healthcare systems.  Past medical history, social, surgical and family history all reviewed in electronic medical record.  No pertanent information unless stated regarding to the chief complaint.   Past Medical History:  Diagnosis Date   ALLERGIC RHINITIS CAUSE UNSPECIFIED    ANXIETY DISORDER    Complication of anesthesia    CYSTIC ACNE    DEPRESSION    DYSPLASTIC NEVUS, CHEST    ELEVATED BP READING WITHOUT DX HYPERTENSION    GERD (gastroesophageal reflux disease)    INSOMNIA    Mixed hyperlipidemia    PONV (postoperative nausea and vomiting)    THROMBOCYTOSIS     Allergies[1]   Review of Systems:  No headache, visual changes, nausea, vomiting, diarrhea, constipation, dizziness, abdominal pain, skin rash, fevers, chills, night sweats, weight loss, swollen lymph nodes, body aches, joint swelling, chest pain, shortness of breath, mood changes. POSITIVE muscle aches  Objective  Blood pressure 124/78, pulse 98, height 5' 3 (1.6 m), weight 194 lb (88 kg), last menstrual period 12/15/2011, SpO2 99%.   General: No  apparent distress alert and oriented x3 mood and affect normal, dressed appropriately.  HEENT: Pupils equal, extraocular movements intact  Respiratory: Patient's speak in full sentences and does not appear short of breath  Cardiovascular: No lower extremity edema, non tender, no erythema  Gait relatively normal MSK:  Back  Right hip exam shows tenderness to palpation over the greater trochanteric area.  Patient does have a positive FABER test noted.  Negative straight leg test.  Mild discomfort in the lower back more on the left side.    Procedure: Real-time Ultrasound Guided Injection of right greater trochanteric bursitis secondary to patient's body habitus Device: GE Logiq Q7 Ultrasound guided injection is preferred based studies that show increased duration, increased effect, greater accuracy, decreased procedural pain, increased response rate, and decreased cost with ultrasound guided versus blind injection.  Verbal informed consent obtained.  Time-out conducted.  Noted no overlying erythema, induration, or other signs of local infection.  Skin prepped in a sterile fashion.  Local anesthesia: Topical Ethyl chloride.  With sterile technique and under real time ultrasound guidance:  Greater trochanteric area was visualized and patient's bursa was noted. A 22-gauge 3 inch needle was inserted and 4 cc of 0.5% Marcaine  and 1 cc of Kenalog 40 mg/dL was injected. Pictures taken Completed without difficulty  Pain immediately resolved suggesting accurate placement of the medication.  Advised to call if fevers/chills, erythema, induration, drainage, or persistent bleeding.  Images permanently stored  Impression: Technically  successful ultrasound guided injection.   Osteopathic findings  C2 flexed rotated and side bent right C6 flexed rotated and side bent left T3 extended rotated and side bent right inhaled rib L2 flexed rotated and side bent left L4 flexed rotated and side bent  left Sacrum left on left    Assessment and Plan:  Chronic low back pain Chronic low back pain.  Discussed icing regimen and home exercises, discussed which activities to do and which ones to avoid.  Increase activity slowly.  Discussed icing regimen.  Responded well to osteopathic manipulation after further evaluation.  Still has left-sided sacroiliac joint we will continue to monitor  Greater trochanteric bursitis of right hip Chronic problem with exacerbation.  Discussed icing regimen and home exercises, discussed which activities to do and which ones to avoid.  Increase activity slowly.  Discussed hip abductor strengthening.  Muscle relaxers and gabapentin  still can be helpful.  Follow-up again in 6 to 12 weeks    Nonallopathic problems  Decision today to treat with OMT was based on Physical Exam  After verbal consent patient was treated with HVLA, ME, FPR techniques in cervical, rib, thoracic, lumbar, and sacral  areas  Patient tolerated the procedure well with improvement in symptoms  Patient given exercises, stretches and lifestyle modifications  See medications in patient instructions if given  Patient will follow up in 4-8 weeks    The above documentation has been reviewed and is accurate and complete Jessica Mena M Herald Vallin, DO          Note: This dictation was prepared with Dragon dictation along with smaller phrase technology. Any transcriptional errors that result from this process are unintentional.            [1] No Known Allergies  "

## 2024-09-06 ENCOUNTER — Ambulatory Visit: Payer: Self-pay | Admitting: Family Medicine

## 2024-09-06 ENCOUNTER — Encounter: Payer: Self-pay | Admitting: Family Medicine

## 2024-09-06 ENCOUNTER — Other Ambulatory Visit: Payer: Self-pay

## 2024-09-06 ENCOUNTER — Ambulatory Visit (INDEPENDENT_AMBULATORY_CARE_PROVIDER_SITE_OTHER): Payer: Self-pay

## 2024-09-06 VITALS — BP 124/78 | HR 98 | Ht 63.0 in | Wt 194.0 lb

## 2024-09-06 DIAGNOSIS — M5442 Lumbago with sciatica, left side: Secondary | ICD-10-CM | POA: Diagnosis not present

## 2024-09-06 DIAGNOSIS — M9908 Segmental and somatic dysfunction of rib cage: Secondary | ICD-10-CM

## 2024-09-06 DIAGNOSIS — M9903 Segmental and somatic dysfunction of lumbar region: Secondary | ICD-10-CM

## 2024-09-06 DIAGNOSIS — M9902 Segmental and somatic dysfunction of thoracic region: Secondary | ICD-10-CM

## 2024-09-06 DIAGNOSIS — M7061 Trochanteric bursitis, right hip: Secondary | ICD-10-CM | POA: Diagnosis not present

## 2024-09-06 DIAGNOSIS — M9901 Segmental and somatic dysfunction of cervical region: Secondary | ICD-10-CM | POA: Diagnosis not present

## 2024-09-06 DIAGNOSIS — M25551 Pain in right hip: Secondary | ICD-10-CM

## 2024-09-06 DIAGNOSIS — M9904 Segmental and somatic dysfunction of sacral region: Secondary | ICD-10-CM

## 2024-09-06 DIAGNOSIS — G8929 Other chronic pain: Secondary | ICD-10-CM | POA: Diagnosis not present

## 2024-09-06 NOTE — Patient Instructions (Addendum)
 Injected GT today Exercises See me in 2 months

## 2024-09-06 NOTE — Assessment & Plan Note (Signed)
 Chronic problem with exacerbation.  Discussed icing regimen and home exercises, discussed which activities to do and which ones to avoid.  Increase activity slowly.  Discussed hip abductor strengthening.  Muscle relaxers and gabapentin  still can be helpful.  Follow-up again in 6 to 12 weeks

## 2024-09-06 NOTE — Assessment & Plan Note (Signed)
 Chronic low back pain.  Discussed icing regimen and home exercises, discussed which activities to do and which ones to avoid.  Increase activity slowly.  Discussed icing regimen.  Responded well to osteopathic manipulation after further evaluation.  Still has left-sided sacroiliac joint we will continue to monitor

## 2024-09-10 ENCOUNTER — Ambulatory Visit: Payer: Self-pay | Admitting: Family Medicine

## 2024-09-14 ENCOUNTER — Ambulatory Visit: Payer: Self-pay | Admitting: Family Medicine

## 2024-09-17 ENCOUNTER — Ambulatory Visit: Payer: Self-pay | Admitting: Family Medicine

## 2024-09-25 ENCOUNTER — Ambulatory Visit: Payer: Self-pay | Admitting: Internal Medicine

## 2024-10-15 ENCOUNTER — Ambulatory Visit: Admitting: Internal Medicine

## 2024-10-30 ENCOUNTER — Ambulatory Visit: Payer: Self-pay | Admitting: Family Medicine
